# Patient Record
Sex: Female | Born: 1935 | Race: White | Hispanic: No | Smoking: Never smoker
Health system: Southern US, Community
[De-identification: ages and names within clinical notes are randomized; demographics above are authoritative.]

## PROBLEM LIST (undated history)

## (undated) DIAGNOSIS — Z9289 Personal history of other medical treatment: Secondary | ICD-10-CM

## (undated) DIAGNOSIS — I4729 Other ventricular tachycardia: Secondary | ICD-10-CM

## (undated) DIAGNOSIS — R002 Palpitations: Secondary | ICD-10-CM

## (undated) DIAGNOSIS — M1990 Unspecified osteoarthritis, unspecified site: Secondary | ICD-10-CM

## (undated) DIAGNOSIS — B059 Measles without complication: Secondary | ICD-10-CM

## (undated) DIAGNOSIS — I219 Acute myocardial infarction, unspecified: Secondary | ICD-10-CM

## (undated) DIAGNOSIS — Z8619 Personal history of other infectious and parasitic diseases: Secondary | ICD-10-CM

## (undated) DIAGNOSIS — I340 Nonrheumatic mitral (valve) insufficiency: Secondary | ICD-10-CM

## (undated) DIAGNOSIS — R0989 Other specified symptoms and signs involving the circulatory and respiratory systems: Secondary | ICD-10-CM

## (undated) DIAGNOSIS — I2510 Atherosclerotic heart disease of native coronary artery without angina pectoris: Secondary | ICD-10-CM

## (undated) DIAGNOSIS — I10 Essential (primary) hypertension: Secondary | ICD-10-CM

## (undated) DIAGNOSIS — I5022 Chronic systolic (congestive) heart failure: Secondary | ICD-10-CM

## (undated) DIAGNOSIS — I071 Rheumatic tricuspid insufficiency: Secondary | ICD-10-CM

## (undated) DIAGNOSIS — I493 Ventricular premature depolarization: Secondary | ICD-10-CM

## (undated) DIAGNOSIS — F419 Anxiety disorder, unspecified: Secondary | ICD-10-CM

## (undated) DIAGNOSIS — E871 Hypo-osmolality and hyponatremia: Secondary | ICD-10-CM

## (undated) DIAGNOSIS — D696 Thrombocytopenia, unspecified: Secondary | ICD-10-CM

## (undated) DIAGNOSIS — E785 Hyperlipidemia, unspecified: Secondary | ICD-10-CM

## (undated) DIAGNOSIS — B269 Mumps without complication: Secondary | ICD-10-CM

## (undated) DIAGNOSIS — I499 Cardiac arrhythmia, unspecified: Secondary | ICD-10-CM

## (undated) HISTORY — DX: Nonrheumatic mitral (valve) insufficiency: I34.0

## (undated) HISTORY — PX: CORONARY ARTERY BYPASS GRAFT: SHX141

## (undated) HISTORY — DX: Other ventricular tachycardia: I47.29

## (undated) HISTORY — DX: Personal history of other medical treatment: Z92.89

## (undated) HISTORY — PX: TONSILLECTOMY: SUR1361

## (undated) HISTORY — DX: Anxiety disorder, unspecified: F41.9

## (undated) HISTORY — DX: Hypo-osmolality and hyponatremia: E87.1

## (undated) HISTORY — DX: Other specified symptoms and signs involving the circulatory and respiratory systems: R09.89

## (undated) HISTORY — DX: Hypomagnesemia: E83.42

## (undated) HISTORY — DX: Hyperlipidemia, unspecified: E78.5

## (undated) HISTORY — DX: Ventricular premature depolarization: I49.3

## (undated) HISTORY — DX: Chronic systolic (congestive) heart failure: I50.22

## (undated) HISTORY — DX: Rheumatic tricuspid insufficiency: I07.1

## (undated) HISTORY — DX: Thrombocytopenia, unspecified: D69.6

## (undated) HISTORY — DX: Palpitations: R00.2

## (undated) HISTORY — DX: Atherosclerotic heart disease of native coronary artery without angina pectoris: I25.10

---

## 1997-06-06 HISTORY — PX: CARDIAC CATHETERIZATION: SHX172

## 1997-11-12 ENCOUNTER — Emergency Department (HOSPITAL_COMMUNITY): Admission: EM | Admit: 1997-11-12 | Discharge: 1997-11-12 | Payer: Self-pay | Admitting: Emergency Medicine

## 1999-12-09 ENCOUNTER — Other Ambulatory Visit: Admission: RE | Admit: 1999-12-09 | Discharge: 1999-12-09 | Payer: Self-pay | Admitting: Endocrinology

## 2002-01-18 ENCOUNTER — Encounter: Admission: RE | Admit: 2002-01-18 | Discharge: 2002-01-18 | Payer: Self-pay | Admitting: Endocrinology

## 2002-01-18 ENCOUNTER — Encounter: Payer: Self-pay | Admitting: Endocrinology

## 2006-11-30 ENCOUNTER — Encounter: Admission: RE | Admit: 2006-11-30 | Discharge: 2006-11-30 | Payer: Self-pay | Admitting: Endocrinology

## 2007-05-30 ENCOUNTER — Ambulatory Visit: Payer: Self-pay | Admitting: Surgery

## 2010-04-21 ENCOUNTER — Ambulatory Visit: Payer: Self-pay | Admitting: Cardiovascular Disease

## 2010-07-04 ENCOUNTER — Ambulatory Visit: Payer: Self-pay | Admitting: Cardiovascular Disease

## 2010-10-09 ENCOUNTER — Encounter: Payer: Self-pay | Admitting: Endocrinology

## 2010-12-23 NOTE — Procedures (Signed)
CAROTID DUPLEX EXAM   INDICATION:  Left carotid bruit.   HISTORY:  Diabetes:  Yes  Cardiac:  CABG in 1998  Hypertension:  Yes  Smoking:  No  Previous Surgery:  No  CV History:  Amaurosis Fugax:  No, Paresthesias:  No,  Hemiparesis:  No                                       RIGHT             LEFT  Brachial systolic pressure:         120               120  Brachial Doppler waveforms:         Biphasic          Biphasic  Vertebral direction of flow:        Antegrade         Antegrade  DUPLEX VELOCITIES (cm/sec)  CCA peak systolic                   96                101  ECA peak systolic                   94                93  [name] peak systolic                   64                80  [name] end diastolic                   20                29  PLAQUE MORPHOLOGY:                  Calcified         Mixed  PLAQUE AMOUNT:                      Mild              Mild  PLAQUE LOCATION:                    Distal CCA, proximal [name]            [name]   IMPRESSION:  20% to 39% bilateral internal carotid artery stenosis.   Study unchanged from 05/26/2004.   A preliminary copy was faxed to Dr. Harvie Bridge office.   ___________________________________________  Janetta Hora. Darrick Penna, MD   DP/MEDQ  D:  05/30/2007  T:  05/31/2007  Job:  [id number]

## 2010-12-29 ENCOUNTER — Encounter: Payer: Self-pay | Admitting: Cardiovascular Disease

## 2010-12-29 DIAGNOSIS — F419 Anxiety disorder, unspecified: Secondary | ICD-10-CM | POA: Insufficient documentation

## 2010-12-29 DIAGNOSIS — I2510 Atherosclerotic heart disease of native coronary artery without angina pectoris: Secondary | ICD-10-CM | POA: Insufficient documentation

## 2010-12-29 DIAGNOSIS — R0989 Other specified symptoms and signs involving the circulatory and respiratory systems: Secondary | ICD-10-CM | POA: Insufficient documentation

## 2010-12-29 DIAGNOSIS — R002 Palpitations: Secondary | ICD-10-CM | POA: Insufficient documentation

## 2010-12-29 DIAGNOSIS — E785 Hyperlipidemia, unspecified: Secondary | ICD-10-CM | POA: Insufficient documentation

## 2010-12-29 DIAGNOSIS — E119 Type 2 diabetes mellitus without complications: Secondary | ICD-10-CM | POA: Insufficient documentation

## 2010-12-29 DIAGNOSIS — I493 Ventricular premature depolarization: Secondary | ICD-10-CM | POA: Insufficient documentation

## 2010-12-31 ENCOUNTER — Ambulatory Visit: Payer: Self-pay | Admitting: Cardiovascular Disease

## 2010-12-31 ENCOUNTER — Encounter: Payer: Self-pay | Admitting: Cardiovascular Disease

## 2010-12-31 ENCOUNTER — Ambulatory Visit (INDEPENDENT_AMBULATORY_CARE_PROVIDER_SITE_OTHER): Payer: Medicare Other | Admitting: Cardiovascular Disease

## 2010-12-31 VITALS — BP 138/72 | HR 64 | Ht 62.0 in | Wt 122.2 lb

## 2010-12-31 DIAGNOSIS — I2510 Atherosclerotic heart disease of native coronary artery without angina pectoris: Secondary | ICD-10-CM

## 2010-12-31 MED ORDER — NITROGLYCERIN 0.4 MG SL SUBL: 0.4000 mg | SUBLINGUAL_TABLET | SUBLINGUAL | Status: DC | PRN

## 2010-12-31 NOTE — Progress Notes (Signed)
Cynthia [name] Date of Birth  09/08/1931 [name] [address]     [address] [name], [place]  [address] [phone]  Fax  [phone]  History of Present Illness:  No cardiac complaints. Exercising regularly.   HbA1C has been elevated.   Current Outpatient Prescriptions on File Prior to Visit  Medication Sig Dispense Refill  . calcium-vitamin D (OSCAL WITH D) 500-200 MG-UNIT per tablet Take 1 tablet by mouth 2 (two) times daily.        . Choline Fenofibrate (TRILIPIX) 135 MG capsule Take 135 mg by mouth daily.        . Coenzyme Q10 (COQ10) 200 MG CAPS Take by mouth daily.        [name] ezetimibe (ZETIA) 10 MG tablet Take 10 mg by mouth daily.        . meloxicam (MOBIC) 15 MG tablet Take 7.5 mg by mouth every other day.       . Multiple Vitamin (MULTIVITAMIN) tablet Take 1 tablet by mouth daily.        . rosuvastatin (CRESTOR) 20 MG tablet Take 20 mg by mouth daily.        . valsartan-hydrochlorothiazide (DIOVAN-HCT) 320-25 MG per tablet Take 1 tablet by mouth as needed.        . vitamin C (ASCORBIC ACID) 500 MG tablet Take 500 mg by mouth daily.        [name] DISCONTD: aspirin 325 MG tablet Take 325 mg by mouth daily.        [name] DISCONTD: sitaGLIPtan-metformin (JANUMET) 50-500 MG per tablet Take 1 tablet by mouth 2 (two) times daily with a meal.         No Known Allergies  Past Medical History  Diagnosis Date  . Coronary artery disease     STATUS POST PCI AND LATER CABG  . Dyslipidemia   . Diabetes mellitus   . Carotid bruit     LEFT  . Anxiety   . Palpitations   . PVC's (premature ventricular contractions)     Past Surgical History  Procedure Date  . Cardiac catheterization 06/06/97    IT REVEALA MILD TO MODERATE LEFT VENTRICULAR SYSTOLIC DYSFUNCTION  WITH EF OF 40%. THERE IS PERSISTENT AKINESIS OF THE INFERIOR WALL. THERE IS MILD REGURGITATION PRESENT. THERE IS MILD HYPOKINESIS OF THE ANTERIOR  AND APEX WALL  . Coronary artery  bypass graft   . Tonsillectomy     History  Smoking status  . Never Smoker   Smokeless tobacco  . Not on file    History  Alcohol Use No    Family History  Problem Relation Age of Onset  . Heart attack Mother   . Stroke Father   . Cancer Father     Reviw of Systems:  Reviewed in the HPI.  All other systems are negative.  Physical Exam: BP 138/72  Pulse 64  Ht 5\' 2"  (1.575 m)  Wt 122 lb 3.2 oz (55.43 kg)  BMI 22.35 kg/m2 The patient is alert and oriented x 3.  The mood and affect are normal.  The skin is warm and dry.  Color is normal.  The HEENT exam reveals that the sclera are nonicteric.  The mucous membranes are moist.  The carotids are 2+ without bruits.  There is no thyromegaly.  There is no JVD.  The lungs are clear.  The chest wall is non tender.  The heart exam reveals a regular rate with a  normal S1 and S2.  There are no murmurs, gallops, or rubs.  The PMI is not displaced.   Abdominal exam reveals good bowel sounds.  There is no guarding or rebound.  There is no hepatosplenomegaly or tenderness.  There are no masses.  Exam of the legs reveal no clubbing, cyanosis, or edema.  The legs are without rashes.  The distal pulses are intact.  Cranial nerves II - XII are intact.  Motor and sensory functions are intact.  The gait is normal.  Assessment / Plan:

## 2010-12-31 NOTE — Assessment & Plan Note (Signed)
Cynthia [name] seems to be doing well from a cardiac standpoint. I've encouraged her to get back out and exercise on a more regular basis. I am pleased that she's not having any episodes of angina.

## 2011-07-09 ENCOUNTER — Ambulatory Visit: Payer: Medicare Other | Admitting: Cardiovascular Disease

## 2011-07-09 ENCOUNTER — Ambulatory Visit (INDEPENDENT_AMBULATORY_CARE_PROVIDER_SITE_OTHER): Payer: Medicare Other | Admitting: Cardiovascular Disease

## 2011-07-09 ENCOUNTER — Encounter: Payer: Self-pay | Admitting: Cardiovascular Disease

## 2011-07-09 VITALS — BP 132/63 | HR 72 | Ht 62.0 in | Wt 119.4 lb

## 2011-07-09 DIAGNOSIS — I2510 Atherosclerotic heart disease of native coronary artery without angina pectoris: Secondary | ICD-10-CM

## 2011-07-09 DIAGNOSIS — I10 Essential (primary) hypertension: Secondary | ICD-10-CM

## 2011-07-09 MED ORDER — EZETIMIBE 10 MG PO TABS: 10.0000 mg | ORAL_TABLET | Freq: Every day | ORAL | Status: DC

## 2011-07-09 MED ORDER — CHOLINE FENOFIBRATE 135 MG PO CPDR: 135.0000 mg | DELAYED_RELEASE_CAPSULE | Freq: Every day | ORAL | Status: DC

## 2011-07-09 MED ORDER — ROSUVASTATIN CALCIUM 20 MG PO TABS: 20.0000 mg | ORAL_TABLET | Freq: Every day | ORAL | Status: DC

## 2011-07-09 NOTE — Assessment & Plan Note (Signed)
She's not taking her Diovan/ HCTZ as prescribed. She is written for 320/ 25 mg tablets.  She takes one quarter of a tablet every several days. She's working with Dr. supplements. I think that she needs a much smaller tablet-perhaps 40 or 80 mg that she takes on a daily basis. We'll have her continue to follow up with Dr. Evlyn Kanner for this issue.

## 2011-07-09 NOTE — Patient Instructions (Signed)
Your physician wants you to follow-up in: 6 months, You will receive a reminder letter in the mail two months in advance. If you don't receive a letter, please call our office to schedule the follow-up appointment.  Your physician recommends that you continue on your current medications as directed. Please refer to the Current Medication list given to you today.   

## 2011-07-09 NOTE — Assessment & Plan Note (Addendum)
She's doing fine from a cardiac standpoint. She's not having episodes of angina.  We'll continue with her same medications.

## 2011-07-09 NOTE — Progress Notes (Signed)
Cynthia [name] Date of Birth  06-Apr-1932 [facility] [address]    [address] [place], [place]  [address] [phone]  Fax  [phone]  History of Present Illness:  Cynthia [name] is a 77 y.o. female with a history of coronary artery disease. She status post PCI and then later had coronary artery bypass grafting. She also has a history of dyslipidemia, diabetes mellitus, and a left carotid bruit.  She denies any chest pain.  She does have some DOE - only with exertion. She's not been walking quite as much as she would like because of a leg injury. She thinks that she might be out of shape.  Current Outpatient Prescriptions on File Prior to Visit  Medication Sig Dispense Refill  . aspirin 81 MG tablet Take 81 mg by mouth daily.        . Choline Fenofibrate (TRILIPIX) 135 MG capsule Take 135 mg by mouth daily.        . Coenzyme Q10 (COQ10) 200 MG CAPS Take by mouth daily.        [name] ezetimibe (ZETIA) 10 MG tablet Take 10 mg by mouth daily.        . meloxicam (MOBIC) 15 MG tablet Take 7.5 mg by mouth daily.       . Multiple Vitamin (MULTIVITAMIN) tablet Take 1 tablet by mouth daily.        . nitroGLYCERIN (NITROSTAT) 0.4 MG SL tablet Place 1 tablet (0.4 mg total) under the tongue every 5 (five) minutes as needed for chest pain.  25 tablet  12  . rosuvastatin (CRESTOR) 20 MG tablet Take 20 mg by mouth daily.        . SitaGLIPtin-MetFORMIN HCl [phone] MG TB24 Take by mouth daily. 2 DAILY       . vitamin C (ASCORBIC ACID) 500 MG tablet Take 500 mg by mouth daily.        . valsartan-hydrochlorothiazide (DIOVAN-HCT) 320-25 MG per tablet Take 1 tablet by mouth as needed.       She is not taking the Diovan - Hctz on a regular basis.  No Known Allergies  Past Medical History  Diagnosis Date  . Coronary artery disease     STATUS POST PCI AND LATER CABG  . Dyslipidemia   . Diabetes mellitus   . Carotid bruit     LEFT  . Anxiety   . Palpitations   . PVC's (premature ventricular  contractions)     Past Surgical History  Procedure Date  . Cardiac catheterization 06/06/97    IT REVEALA MILD TO MODERATE LEFT VENTRICULAR SYSTOLIC DYSFUNCTION  WITH EF OF 40%. THERE IS PERSISTENT AKINESIS OF THE INFERIOR WALL. THERE IS MILD REGURGITATION PRESENT. THERE IS MILD HYPOKINESIS OF THE ANTERIOR  AND APEX WALL  . Coronary artery bypass graft   . Tonsillectomy     History  Smoking status  . Never Smoker   Smokeless tobacco  . Not on file    History  Alcohol Use No    Family History  Problem Relation Age of Onset  . Heart attack Mother   . Stroke Father   . Cancer Father     Reviw of Systems:  Reviewed in the HPI.  All other systems are negative.  Physical Exam: BP 132/63  Pulse 72  Ht 5\' 2"  (1.575 m)  Wt 119 lb 6.4 oz (54.159 kg)  BMI 21.84 kg/m2 The patient is alert and oriented x 3.  The mood and affect  are normal.   Skin: warm and dry.  Color is normal.    HEENT:   She has good carotids. There is no JVD. Her neck is supple. Her mucous membranes are moist.  Lungs: Lungs are clear to auscultation   Heart: Heart regular rate S1-S2.    Abdomen: Good bowel sounds. There is no hepatosplenomegaly.  Extremities:  She has no clubbing cyanosis or edema.  Neuro:  There's and was nonfocal. Her gait is normal    ECG: Normal sinus rhythm. She has previous inferior wall myocardial infarction and a previous anterior lateral Mi. She has T-wave inversions laterally.  There are no significant changes from her previous tracing.  Assessment / Plan:

## 2011-09-23 DIAGNOSIS — I10 Essential (primary) hypertension: Secondary | ICD-10-CM | POA: Diagnosis not present

## 2011-09-23 DIAGNOSIS — J069 Acute upper respiratory infection, unspecified: Secondary | ICD-10-CM | POA: Diagnosis not present

## 2011-09-23 DIAGNOSIS — I2510 Atherosclerotic heart disease of native coronary artery without angina pectoris: Secondary | ICD-10-CM | POA: Diagnosis not present

## 2011-10-27 DIAGNOSIS — H2510 Age-related nuclear cataract, unspecified eye: Secondary | ICD-10-CM | POA: Diagnosis not present

## 2011-10-27 DIAGNOSIS — E119 Type 2 diabetes mellitus without complications: Secondary | ICD-10-CM | POA: Diagnosis not present

## 2011-11-17 ENCOUNTER — Encounter: Payer: Self-pay | Admitting: Cardiovascular Disease

## 2011-11-17 DIAGNOSIS — E785 Hyperlipidemia, unspecified: Secondary | ICD-10-CM | POA: Diagnosis not present

## 2011-11-17 DIAGNOSIS — D72819 Decreased white blood cell count, unspecified: Secondary | ICD-10-CM | POA: Diagnosis not present

## 2011-11-17 DIAGNOSIS — E1149 Type 2 diabetes mellitus with other diabetic neurological complication: Secondary | ICD-10-CM | POA: Diagnosis not present

## 2011-11-17 DIAGNOSIS — E042 Nontoxic multinodular goiter: Secondary | ICD-10-CM | POA: Diagnosis not present

## 2011-11-17 DIAGNOSIS — I10 Essential (primary) hypertension: Secondary | ICD-10-CM | POA: Diagnosis not present

## 2011-11-17 DIAGNOSIS — E559 Vitamin D deficiency, unspecified: Secondary | ICD-10-CM | POA: Diagnosis not present

## 2012-01-15 ENCOUNTER — Encounter: Payer: Self-pay | Admitting: Cardiovascular Disease

## 2012-01-15 ENCOUNTER — Ambulatory Visit (INDEPENDENT_AMBULATORY_CARE_PROVIDER_SITE_OTHER): Payer: Medicare Other | Admitting: Cardiovascular Disease

## 2012-01-15 VITALS — BP 140/70 | HR 82 | Resp 18 | Ht 62.0 in | Wt 117.0 lb

## 2012-01-15 DIAGNOSIS — I2510 Atherosclerotic heart disease of native coronary artery without angina pectoris: Secondary | ICD-10-CM

## 2012-01-15 NOTE — Patient Instructions (Signed)
Your physician wants you to follow-up in: 6 months  You will receive a reminder letter in the mail two months in advance. If you don't receive a letter, please call our office to schedule the follow-up appointment.  Your physician recommends that you return for a FASTING lipid profile: 6 months   

## 2012-01-15 NOTE — Progress Notes (Signed)
Cynthia [name] Date of Birth  09/18/1931       [facility] Office [address]  [address] [place], [place]  [address]   [place], [place]  [address] [phone]     [phone]   Fax  [phone]    Fax [phone]  Problem List: 1. Coronary artery disease-status post PCI and CABG 2. Dyslipidemia 3. Diabetes mellitus 4. Left carotid bruit  History of Present Illness: Cynthia [name] is a 78 y.o. female with a history of coronary artery disease. She status post PCI and then later had coronary artery bypass grafting. She also has a history of dyslipidemia, diabetes mellitus, and a left carotid bruit.  She has done well from a cardiac standpoint.  She has not been exercising as much as she has in the past.  Current Outpatient Prescriptions on File Prior to Visit  Medication Sig Dispense Refill  . aspirin 81 MG tablet Take 81 mg by mouth daily.        . calcium-vitamin D (CALCIUM 500+D) 500-400 MG-UNIT per tablet Take 2 tablets by mouth daily.        . Choline Fenofibrate (TRILIPIX) 135 MG capsule Take 1 capsule (135 mg total) by mouth daily.  90 capsule  3  . Coenzyme Q10 (COQ10) 200 MG CAPS Take by mouth daily.        [name] ezetimibe (ZETIA) 10 MG tablet Take 1 tablet (10 mg total) by mouth daily.  90 tablet  3  . meloxicam (MOBIC) 15 MG tablet Take 7.5 mg by mouth daily.       . Multiple Vitamin (MULTIVITAMIN) tablet Take 1 tablet by mouth daily.        . rosuvastatin (CRESTOR) 20 MG tablet Take 1 tablet (20 mg total) by mouth daily.  90 tablet  3  . SitaGLIPtin-MetFORMIN HCl [phone] MG TB24 Take by mouth daily. 2 DAILY       . valsartan-hydrochlorothiazide (DIOVAN-HCT) 320-25 MG per tablet Take 1 tablet by mouth as needed.       . vitamin C (ASCORBIC ACID) 500 MG tablet Take 500 mg by mouth daily.        . nitroGLYCERIN (NITROSTAT) 0.4 MG SL tablet Place 1 tablet (0.4 mg total) under the tongue every 5 (five) minutes as needed for chest  pain.  25 tablet  12    No Known Allergies  Past Medical History  Diagnosis Date  . Coronary artery disease     STATUS POST PCI AND LATER CABG  . Dyslipidemia   . Diabetes mellitus   . Carotid bruit     LEFT  . Anxiety   . Palpitations   . PVC's (premature ventricular contractions)     Past Surgical History  Procedure Date  . Cardiac catheterization 06/06/97    IT REVEALA MILD TO MODERATE LEFT VENTRICULAR SYSTOLIC DYSFUNCTION  WITH EF OF 40%. THERE IS PERSISTENT AKINESIS OF THE INFERIOR WALL. THERE IS MILD REGURGITATION PRESENT. THERE IS MILD HYPOKINESIS OF THE ANTERIOR  AND APEX WALL  . Coronary artery bypass graft   . Tonsillectomy     History  Smoking status  . Never Smoker   Smokeless tobacco  . Not on file    History  Alcohol Use No    Family History  Problem Relation Age of Onset  . Heart attack Mother   . Stroke Father   . Cancer Father     Reviw of Systems:  Reviewed in the HPI.  All other systems are negative.  Physical Exam: Blood pressure 140/70, pulse 82, resp. rate 18, height 5\' 2"  (1.575 m), weight 117 lb (53.071 kg). General: Well developed, well nourished, in no acute distress.  Head: Normocephalic, atraumatic, sclera non-icteric, mucus membranes are moist,   Neck: Supple. Carotids are 2 +.  There is a soft left systolic bruit.   No JVD  Lungs: Clear bilaterally to auscultation.  Heart: regular rate.  normal  S1 S2. No murmurs, gallops or rubs.  Abdomen: Soft, non-tender, non-distended with normal bowel sounds. No hepatomegaly. No rebound/guarding. No masses.  Msk:  Strength and tone are normal  Extremities: No clubbing or cyanosis. No edema.  Distal pedal pulses are 2+ and equal bilaterally.  Neuro: Alert and oriented X 3. Moves all extremities spontaneously.  Psych:  Responds to questions appropriately with a normal affect.  ECG:  Her labs from her medical doctor's office reveals a total cholesterol of 117. The triglyceride  level is 39. Her HDL is 43. LDL 66. Her non-HDL is 74. Her LFTs are normal. Glucose is 147. The sodium is 134. Potassium is 4.1. Her CBC is within normal limits with the exception of a mildly decreased hemoglobin. Her TSH is 1.8. Her vitamin D level is 40.9. Assessment / Plan:

## 2012-01-15 NOTE — Assessment & Plan Note (Signed)
Cynthia [name] is doing very well. She's not had any episodes of chest pain or shortness breath. Her labs look great. Her back in 6 months for an office visit and fasting labs.  I've asked her to exercise a little regular. She has occasional episodes of shortness breath with exertion but I suspect that this is due to deconditioning.

## 2012-03-25 DIAGNOSIS — E559 Vitamin D deficiency, unspecified: Secondary | ICD-10-CM | POA: Diagnosis not present

## 2012-03-25 DIAGNOSIS — R112 Nausea with vomiting, unspecified: Secondary | ICD-10-CM | POA: Diagnosis not present

## 2012-03-25 DIAGNOSIS — E785 Hyperlipidemia, unspecified: Secondary | ICD-10-CM | POA: Diagnosis not present

## 2012-03-25 DIAGNOSIS — E1149 Type 2 diabetes mellitus with other diabetic neurological complication: Secondary | ICD-10-CM | POA: Diagnosis not present

## 2012-04-13 DIAGNOSIS — D485 Neoplasm of uncertain behavior of skin: Secondary | ICD-10-CM | POA: Diagnosis not present

## 2012-04-13 DIAGNOSIS — D235 Other benign neoplasm of skin of trunk: Secondary | ICD-10-CM | POA: Diagnosis not present

## 2012-05-24 DIAGNOSIS — Z23 Encounter for immunization: Secondary | ICD-10-CM | POA: Diagnosis not present

## 2012-06-03 DIAGNOSIS — Z1231 Encounter for screening mammogram for malignant neoplasm of breast: Secondary | ICD-10-CM | POA: Diagnosis not present

## 2012-07-14 ENCOUNTER — Ambulatory Visit (INDEPENDENT_AMBULATORY_CARE_PROVIDER_SITE_OTHER): Payer: Medicare Other | Admitting: *Deleted

## 2012-07-14 DIAGNOSIS — I2510 Atherosclerotic heart disease of native coronary artery without angina pectoris: Secondary | ICD-10-CM

## 2012-07-14 DIAGNOSIS — I4949 Other premature depolarization: Secondary | ICD-10-CM | POA: Diagnosis not present

## 2012-07-14 DIAGNOSIS — I10 Essential (primary) hypertension: Secondary | ICD-10-CM

## 2012-07-14 DIAGNOSIS — I493 Ventricular premature depolarization: Secondary | ICD-10-CM

## 2012-07-14 LAB — LIPID PANEL
Cholesterol: 98 mg/dL (ref 0–200)
LDL Cholesterol: 49 mg/dL (ref 0–99)
Triglycerides: 49 mg/dL (ref 0.0–149.0)

## 2012-07-18 ENCOUNTER — Encounter: Payer: Self-pay | Admitting: Cardiovascular Disease

## 2012-07-18 ENCOUNTER — Ambulatory Visit (INDEPENDENT_AMBULATORY_CARE_PROVIDER_SITE_OTHER): Payer: Medicare Other | Admitting: Cardiovascular Disease

## 2012-07-18 VITALS — BP 128/64 | HR 71 | Ht 62.0 in | Wt 115.1 lb

## 2012-07-18 DIAGNOSIS — I2510 Atherosclerotic heart disease of native coronary artery without angina pectoris: Secondary | ICD-10-CM | POA: Diagnosis not present

## 2012-07-18 DIAGNOSIS — E785 Hyperlipidemia, unspecified: Secondary | ICD-10-CM

## 2012-07-18 DIAGNOSIS — I10 Essential (primary) hypertension: Secondary | ICD-10-CM | POA: Diagnosis not present

## 2012-07-18 MED ORDER — CHOLINE FENOFIBRATE 135 MG PO CPDR: 135.0000 mg | DELAYED_RELEASE_CAPSULE | Freq: Every day | ORAL | Status: DC

## 2012-07-18 MED ORDER — ROSUVASTATIN CALCIUM 20 MG PO TABS: 20.0000 mg | ORAL_TABLET | Freq: Every day | ORAL | Status: DC

## 2012-07-18 MED ORDER — EZETIMIBE 10 MG PO TABS: 10.0000 mg | ORAL_TABLET | Freq: Every day | ORAL | Status: DC

## 2012-07-18 MED ORDER — NITROGLYCERIN 0.4 MG SL SUBL: 0.4000 mg | SUBLINGUAL_TABLET | SUBLINGUAL | Status: DC | PRN

## 2012-07-18 NOTE — Assessment & Plan Note (Signed)
Her BP has been stable.

## 2012-07-18 NOTE — Patient Instructions (Signed)
Your physician wants you to follow-up in: 6 months  You will receive a reminder letter in the mail two months in advance. If you don't receive a letter, please call our office to schedule the follow-up appointment.  Your physician recommends that you continue on your current medications as directed. Please refer to the Current Medication list given to you today.  

## 2012-07-18 NOTE — Assessment & Plan Note (Signed)
Cynthia [name] is doing well. We'll continue with her same medications. She's not able to exercise much because she is busy taking care of her husband.

## 2012-07-18 NOTE — Progress Notes (Signed)
Cynthia [name] Date of Birth  10/20/1931       [facility] Office [address]  [address] [place], [place]  [address]   [place], [place]  [address] [phone]     [phone]   Fax  [phone]    Fax [phone]  Problem List: 1. Coronary artery disease-status post PCI( April , 1998)  and CABG ( Nov. 1998) 2. Dyslipidemia 3. Diabetes mellitus 4. Left carotid bruit  History of Present Illness: Cynthia [name] is a 78 y.o.  y.o. female with a history of coronary artery disease. She status post PCI and then later had coronary artery bypass grafting. She also has a history of dyslipidemia, diabetes mellitus, and a left carotid bruit.  She has done well from a cardiac standpoint.  She has not been exercising as much as she has in the past.  She complains of generalized fatigue.  She stays busy doing chores.  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  Current Outpatient Prescriptions on File Prior to Visit  Medication Sig Dispense Refill  . aspirin 81 MG tablet Take 81 mg by mouth daily.        . calcium-vitamin D (CALCIUM 500+D) 500-400 MG-UNIT per tablet Take 2 tablets by mouth daily.        . Choline Fenofibrate (TRILIPIX) 135 MG capsule Take 1 capsule (135 mg total) by mouth daily.  90 capsule  3  . Coenzyme Q10 (COQ10) 200 MG CAPS Take by mouth daily.        [name] ezetimibe (ZETIA) 10 MG tablet Take 1 tablet (10 mg total) by mouth daily.  90 tablet  3  . meloxicam (MOBIC) 15 MG tablet Take 7.5 mg by mouth daily.       . Multiple Vitamin (MULTIVITAMIN) tablet Take 1 tablet by mouth daily.        . nitroGLYCERIN (NITROSTAT) 0.4 MG SL tablet Place 1 tablet (0.4 mg total) under the tongue every 5 (five) minutes as needed for chest pain.  25 tablet  12  . rosuvastatin (CRESTOR) 20 MG tablet Take 1 tablet (20 mg total) by mouth daily.  90 tablet  3  . SitaGLIPtin-MetFORMIN HCl [phone] MG TB24 Take by mouth daily. 2 DAILY        . valsartan-hydrochlorothiazide (DIOVAN-HCT) 320-25 MG per tablet Take 1 tablet by mouth as needed.       . vitamin C (ASCORBIC ACID) 500 MG tablet Take 500 mg by mouth daily.          No Known Allergies  Past Medical History  Diagnosis Date  . Coronary artery disease     STATUS POST PCI AND LATER CABG  . Dyslipidemia   . Diabetes mellitus   . Carotid bruit     LEFT  . Anxiety   . Palpitations   . PVC's (premature ventricular contractions)     Past Surgical History  Procedure Date  . Cardiac catheterization 06/06/97    IT REVEALA MILD TO MODERATE LEFT VENTRICULAR SYSTOLIC DYSFUNCTION  WITH EF OF 40%. THERE IS PERSISTENT AKINESIS OF THE INFERIOR WALL. THERE IS MILD REGURGITATION PRESENT. THERE IS MILD HYPOKINESIS OF THE ANTERIOR  AND APEX WALL  . Coronary artery bypass graft   . Tonsillectomy     History  Smoking status  . Never Smoker   Smokeless tobacco  . Not on file    History  Alcohol  Use No    Family History  Problem Relation Age of Onset  . Heart attack Mother   . Stroke Father   . Cancer Father     Reviw of Systems:  Reviewed in the HPI.  All other systems are negative.  Physical Exam: Blood pressure 128/64, pulse 71, height 5\' 2"  (1.575 m), weight 115 lb 1.9 oz (52.218 kg). General: Well developed, well nourished, in no acute distress.  Head: Normocephalic, atraumatic, sclera non-icteric, mucus membranes are moist,   Neck: Supple. Carotids are 2 +.  There is a soft left systolic bruit.   No JVD  Lungs: Clear bilaterally to auscultation.  Heart: regular rate.  normal  S1 S2. No murmurs, gallops or rubs.  Abdomen: Soft, non-tender, non-distended with normal bowel sounds. No hepatomegaly. No rebound/guarding. No masses.  Msk:  Strength and tone are normal  Extremities: No clubbing or cyanosis. No edema.  Distal pedal pulses are 2+ and equal bilaterally.  Neuro: Alert and oriented X 3. Moves all extremities spontaneously.  Psych:  Responds  to questions appropriately with a normal affect.  ECG:  07/18/2012: Normal sinus rhythm at 71 beats a minute. She has an old inferior wall myocardial infarct. She has poor R-wave progression consistent with an anterior wall myocardial infarct. EKG is unchanged from previous tracings.  Assessment / Plan:

## 2012-08-26 DIAGNOSIS — M899 Disorder of bone, unspecified: Secondary | ICD-10-CM | POA: Diagnosis not present

## 2012-08-26 DIAGNOSIS — M949 Disorder of cartilage, unspecified: Secondary | ICD-10-CM | POA: Diagnosis not present

## 2012-08-26 DIAGNOSIS — E1149 Type 2 diabetes mellitus with other diabetic neurological complication: Secondary | ICD-10-CM | POA: Diagnosis not present

## 2012-09-06 DIAGNOSIS — M949 Disorder of cartilage, unspecified: Secondary | ICD-10-CM | POA: Diagnosis not present

## 2012-10-27 ENCOUNTER — Encounter: Payer: Self-pay | Admitting: Cardiovascular Disease

## 2012-11-28 DIAGNOSIS — E1142 Type 2 diabetes mellitus with diabetic polyneuropathy: Secondary | ICD-10-CM | POA: Diagnosis not present

## 2012-11-28 DIAGNOSIS — R634 Abnormal weight loss: Secondary | ICD-10-CM | POA: Diagnosis not present

## 2012-11-28 DIAGNOSIS — E1149 Type 2 diabetes mellitus with other diabetic neurological complication: Secondary | ICD-10-CM | POA: Diagnosis not present

## 2012-11-28 DIAGNOSIS — E559 Vitamin D deficiency, unspecified: Secondary | ICD-10-CM | POA: Diagnosis not present

## 2012-11-28 DIAGNOSIS — E785 Hyperlipidemia, unspecified: Secondary | ICD-10-CM | POA: Diagnosis not present

## 2012-11-28 DIAGNOSIS — D72819 Decreased white blood cell count, unspecified: Secondary | ICD-10-CM | POA: Diagnosis not present

## 2013-01-16 ENCOUNTER — Encounter: Payer: Self-pay | Admitting: Cardiovascular Disease

## 2013-01-16 ENCOUNTER — Ambulatory Visit (INDEPENDENT_AMBULATORY_CARE_PROVIDER_SITE_OTHER): Payer: Medicare Other | Admitting: Cardiovascular Disease

## 2013-01-16 VITALS — BP 122/71 | HR 70 | Ht 62.0 in | Wt 111.4 lb

## 2013-01-16 DIAGNOSIS — R0989 Other specified symptoms and signs involving the circulatory and respiratory systems: Secondary | ICD-10-CM | POA: Diagnosis not present

## 2013-01-16 DIAGNOSIS — I2510 Atherosclerotic heart disease of native coronary artery without angina pectoris: Secondary | ICD-10-CM

## 2013-01-16 NOTE — Assessment & Plan Note (Signed)
She has a bruit in her midline of her abdomen. We'll get an abdominal aorta duplex as well as renal artery duplex to see if we can detect any narrowings.

## 2013-01-16 NOTE — Patient Instructions (Addendum)
Your physician has requested that you have an abdominal aorta duplex. During this test, an ultrasound is used to evaluate the aorta. Allow 30 minutes for this exam. Do not eat after midnight the day before and avoid carbonated beverages  Your physician has requested that you have a renal artery duplex. During this test, an ultrasound is used to evaluate blood flow to the kidneys. Allow one hour for this exam. Do not eat after midnight the day before and avoid carbonated beverages. Take your medications as you usually do.   Your physician wants you to follow-up in: 6 months with ekg You will receive a reminder letter in the mail two months in advance. If you don't receive a letter, please call our office to schedule the follow-up appointment.   Your physician recommends that you return for a FASTING lipid profile: 6 months visit

## 2013-01-16 NOTE — Assessment & Plan Note (Signed)
No angina Continue meds

## 2013-01-16 NOTE — Progress Notes (Signed)
Cynthia [name] Date of Birth  Jan 31, 1932       [facility] Office [address]  [address], [address] [place], [place]  [address]   [place], [place]  [address] [phone]     [phone]   Fax  [phone]    Fax [phone]  Problem List: 1. Coronary artery disease-status post PCI( April , 1998)  and CABG ( Nov. 1998) 2. Dyslipidemia 3. Diabetes mellitus 4. Left carotid bruit  History of Present Illness: Cynthia [name] is a 79 y.o.  y.o. female with a history of coronary artery disease. She status post PCI and then later had coronary artery bypass grafting. She also has a history of dyslipidemia, diabetes mellitus, and a left carotid bruit.  She has done well from a cardiac standpoint.  She has not been exercising as much as she has in the past.  She complains of generalized fatigue.  She stays busy doing chores.  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  January 16, 2013:  Cynthia [name] is dong well.  She is getting over several viral infections over the past few months. No CP.    Current Outpatient Prescriptions on File Prior to Visit  Medication Sig Dispense Refill  . aspirin 81 MG tablet Take 81 mg by mouth daily.        . calcium-vitamin D (CALCIUM 500+D) 500-400 MG-UNIT per tablet Take 2 tablets by mouth daily.        . Choline Fenofibrate (TRILIPIX) 135 MG capsule Take 1 capsule (135 mg total) by mouth daily.  90 capsule  3  . Coenzyme Q10 (COQ10) 200 MG CAPS Take by mouth daily.        [name] ezetimibe (ZETIA) 10 MG tablet Take 1 tablet (10 mg total) by mouth daily.  90 tablet  3  . meloxicam (MOBIC) 15 MG tablet Take 7.5 mg by mouth daily.       . Multiple Vitamin (MULTIVITAMIN) tablet Take 1 tablet by mouth daily.        . nitroGLYCERIN (NITROSTAT) 0.4 MG SL tablet Place 1 tablet (0.4 mg total) under the tongue every 5 (five) minutes as needed for chest pain.  25 tablet  12  . rosuvastatin (CRESTOR) 20 MG tablet Take 1 tablet  (20 mg total) by mouth daily.  90 tablet  3  . SitaGLIPtin-MetFORMIN HCl [id number] MG TB24 Take by mouth daily. 2 DAILY       . valsartan-hydrochlorothiazide (DIOVAN-HCT) 320-25 MG per tablet Take 0.5 tablets by mouth daily.       . vitamin C (ASCORBIC ACID) 500 MG tablet Take 500 mg by mouth daily.         No current facility-administered medications on file prior to visit.    No Known Allergies  Past Medical History  Diagnosis Date  . Coronary artery disease     STATUS POST PCI AND LATER CABG  . Dyslipidemia   . Diabetes mellitus   . Carotid bruit     LEFT  . Anxiety   . Palpitations   . PVC's (premature ventricular contractions)     Past Surgical History  Procedure Laterality Date  . Cardiac catheterization  06/06/97    IT REVEALA MILD TO MODERATE LEFT VENTRICULAR SYSTOLIC DYSFUNCTION  WITH EF OF 40%. THERE IS PERSISTENT AKINESIS OF THE INFERIOR WALL. THERE IS MILD REGURGITATION PRESENT. THERE IS MILD HYPOKINESIS OF THE ANTERIOR  AND APEX  WALL  . Coronary artery bypass graft    . Tonsillectomy      History  Smoking status  . Never Smoker   Smokeless tobacco  . Not on file    History  Alcohol Use No    Family History  Problem Relation Age of Onset  . Heart attack Mother   . Stroke Father   . Cancer Father     Reviw of Systems:  Reviewed in the HPI.  All other systems are negative.  Physical Exam: Blood pressure 122/71, pulse 70, height 5\' 2"  (1.575 m), weight 111 lb 6.4 oz (50.531 kg). General: Well developed, well nourished, in no acute distress.  Head: Normocephalic, atraumatic, sclera non-icteric, mucus membranes are moist,   Neck: Supple. Carotids are 2 +.  There is a soft left systolic bruit.   No JVD  Lungs: Clear bilaterally to auscultation.  Heart: regular rate.  normal  S1 S2. No murmurs, gallops or rubs.  Abdomen: Soft, non-tender, non-distended with normal bowel sounds. Soft midline bruit.   Msk:  Strength and tone are  normal  Extremities: No clubbing or cyanosis. No edema.  Distal pedal pulses are 2+ and equal bilaterally.  Neuro: Alert and oriented X 3. Moves all extremities spontaneously.  Psych:  Responds to questions appropriately with a normal affect.  ECG:  Assessment / Plan:

## 2013-01-16 NOTE — Assessment & Plan Note (Signed)
Her BP has been well controlled.

## 2013-01-24 ENCOUNTER — Encounter (INDEPENDENT_AMBULATORY_CARE_PROVIDER_SITE_OTHER): Payer: Medicare Other

## 2013-01-24 DIAGNOSIS — I700 Atherosclerosis of aorta: Secondary | ICD-10-CM

## 2013-01-24 DIAGNOSIS — I2510 Atherosclerotic heart disease of native coronary artery without angina pectoris: Secondary | ICD-10-CM

## 2013-01-24 DIAGNOSIS — R0989 Other specified symptoms and signs involving the circulatory and respiratory systems: Secondary | ICD-10-CM

## 2013-02-02 ENCOUNTER — Telehealth: Payer: Self-pay | Admitting: *Deleted

## 2013-02-02 NOTE — Telephone Encounter (Signed)
Message copied by Antony Odea on Thu Feb 02, 2013  3:52 PM ------      Message from: Mariane Masters D      Created: Tue Jan 24, 2013 10:06 AM      Regarding: ABDOM.       01/24/13 Patient cancel. ------

## 2013-02-02 NOTE — Telephone Encounter (Signed)
Pt had renal art duplex/ abd aorta was measured at that time so abd duplex was cancelled. Pt aware or results.

## 2013-02-03 ENCOUNTER — Encounter: Payer: Self-pay | Admitting: Cardiovascular Disease

## 2013-02-03 ENCOUNTER — Other Ambulatory Visit: Payer: Self-pay | Admitting: *Deleted

## 2013-02-03 NOTE — Progress Notes (Signed)
PT MYCHART NOTE ASKED THAT THESE MEDS BE CORRECTED/ DONE

## 2013-03-15 ENCOUNTER — Other Ambulatory Visit: Payer: Self-pay

## 2013-05-11 DIAGNOSIS — Z23 Encounter for immunization: Secondary | ICD-10-CM | POA: Diagnosis not present

## 2013-05-30 DIAGNOSIS — Z1331 Encounter for screening for depression: Secondary | ICD-10-CM | POA: Diagnosis not present

## 2013-05-30 DIAGNOSIS — E559 Vitamin D deficiency, unspecified: Secondary | ICD-10-CM | POA: Diagnosis not present

## 2013-05-30 DIAGNOSIS — E042 Nontoxic multinodular goiter: Secondary | ICD-10-CM | POA: Diagnosis not present

## 2013-05-30 DIAGNOSIS — E1149 Type 2 diabetes mellitus with other diabetic neurological complication: Secondary | ICD-10-CM | POA: Diagnosis not present

## 2013-05-30 DIAGNOSIS — E785 Hyperlipidemia, unspecified: Secondary | ICD-10-CM | POA: Diagnosis not present

## 2013-05-30 DIAGNOSIS — E1142 Type 2 diabetes mellitus with diabetic polyneuropathy: Secondary | ICD-10-CM | POA: Diagnosis not present

## 2013-05-30 DIAGNOSIS — I2510 Atherosclerotic heart disease of native coronary artery without angina pectoris: Secondary | ICD-10-CM | POA: Diagnosis not present

## 2013-05-30 DIAGNOSIS — I359 Nonrheumatic aortic valve disorder, unspecified: Secondary | ICD-10-CM | POA: Diagnosis not present

## 2013-06-01 DIAGNOSIS — Z1231 Encounter for screening mammogram for malignant neoplasm of breast: Secondary | ICD-10-CM | POA: Diagnosis not present

## 2013-06-15 ENCOUNTER — Other Ambulatory Visit: Payer: Self-pay

## 2013-07-24 ENCOUNTER — Ambulatory Visit (INDEPENDENT_AMBULATORY_CARE_PROVIDER_SITE_OTHER): Payer: Medicare Other | Admitting: *Deleted

## 2013-07-24 DIAGNOSIS — E785 Hyperlipidemia, unspecified: Secondary | ICD-10-CM | POA: Diagnosis not present

## 2013-07-24 LAB — LIPID PANEL
HDL: 42.4 mg/dL (ref >39.00)
Total CHOL/HDL Ratio: 2
VLDL: 6.2 mg/dL (ref 0.0–40.0)

## 2013-07-31 ENCOUNTER — Encounter: Payer: Self-pay | Admitting: Cardiovascular Disease

## 2013-07-31 ENCOUNTER — Ambulatory Visit (INDEPENDENT_AMBULATORY_CARE_PROVIDER_SITE_OTHER): Payer: Medicare Other | Admitting: Cardiovascular Disease

## 2013-07-31 VITALS — BP 126/60 | HR 74 | Ht 62.0 in | Wt 110.8 lb

## 2013-07-31 DIAGNOSIS — I10 Essential (primary) hypertension: Secondary | ICD-10-CM

## 2013-07-31 DIAGNOSIS — E785 Hyperlipidemia, unspecified: Secondary | ICD-10-CM | POA: Diagnosis not present

## 2013-07-31 DIAGNOSIS — I2510 Atherosclerotic heart disease of native coronary artery without angina pectoris: Secondary | ICD-10-CM | POA: Diagnosis not present

## 2013-07-31 NOTE — Assessment & Plan Note (Signed)
Her BP is well controlled 

## 2013-07-31 NOTE — Patient Instructions (Signed)
Your physician wants you to follow-up in: 6 MONTHS.  You will receive a reminder letter in the mail two months in advance. If you don't receive a letter, please call our office to schedule the follow-up appointment.  Your physician recommends that you continue on your current medications as directed. Please refer to the Current Medication list given to you today.  

## 2013-07-31 NOTE — Progress Notes (Signed)
Cynthia [name] Date of Birth  06/26/1932       [facility] Office [address]  [address] [place], [place]  [address]   [place], [place]  [address] [phone]     [phone]   Fax  [phone]    Fax [phone]  Problem List: 1. Coronary artery disease-status post PCI( April , 1998)  and CABG ( Nov. 1998) 2. Dyslipidemia 3. Diabetes mellitus 4. Left carotid bruit  History of Present Illness: Cynthia [name] is a 79 y.o.  y.o. female with a history of coronary artery disease. She status post PCI and then later had coronary artery bypass grafting. She also has a history of dyslipidemia, diabetes mellitus, and a left carotid bruit.  She has done well from a cardiac standpoint.  She has not been exercising as much as she has in the past.  She complains of generalized fatigue.  She stays busy doing chores.  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  January 16, 2013:  Cynthia [name] is dong well.  She is getting over several viral infections over the past few months. No CP.    Dec. 22,2014:  Cynthia [name] has had a rough summer.  Also has had a cold.  Her husband has been at [facility] since early Sept.   No cardiac issues.   Current Outpatient Prescriptions on File Prior to Visit  Medication Sig Dispense Refill  . aspirin 81 MG tablet Take 81 mg by mouth daily.        . calcium-vitamin D (CALCIUM 500+D) 500-400 MG-UNIT per tablet Take 2 tablets by mouth daily.        . Choline Fenofibrate (TRILIPIX) 135 MG capsule Take 1 capsule (135 mg total) by mouth daily.  90 capsule  3  . Coenzyme Q10 (COQ10) 200 MG CAPS Take by mouth daily.        [name] ezetimibe (ZETIA) 10 MG tablet Take 1 tablet (10 mg total) by mouth daily.  90 tablet  3  . glimepiride (AMARYL) 2 MG tablet Take 2 mg by mouth daily.      . meloxicam (MOBIC) 15 MG tablet Take 7.5 mg by mouth daily.       . Multiple Vitamin (MULTIVITAMIN) tablet Take 1 tablet by mouth daily.         . rosuvastatin (CRESTOR) 20 MG tablet Take 1 tablet (20 mg total) by mouth daily.  90 tablet  3  . sitaGLIPtan-metformin (JANUMET) 50-1000 MG per tablet Take 1 tablet by mouth 2 (two) times daily with a meal.      . valsartan (DIOVAN) 80 MG tablet Take 1 tablet (80 mg total) by mouth daily.      . vitamin C (ASCORBIC ACID) 500 MG tablet Take 500 mg by mouth daily.        . nitroGLYCERIN (NITROSTAT) 0.4 MG SL tablet Place 1 tablet (0.4 mg total) under the tongue every 5 (five) minutes as needed for chest pain.  25 tablet  12   No current facility-administered medications on file prior to visit.    No Known Allergies  Past Medical History  Diagnosis Date  . Coronary artery disease     STATUS POST PCI AND LATER CABG  . Dyslipidemia   . Diabetes mellitus   . Carotid bruit     LEFT  . Anxiety   . Palpitations   . PVC's (premature ventricular contractions)  Past Surgical History  Procedure Laterality Date  . Cardiac catheterization  06/06/97    IT REVEALA MILD TO MODERATE LEFT VENTRICULAR SYSTOLIC DYSFUNCTION  WITH EF OF 40%. THERE IS PERSISTENT AKINESIS OF THE INFERIOR WALL. THERE IS MILD REGURGITATION PRESENT. THERE IS MILD HYPOKINESIS OF THE ANTERIOR  AND APEX WALL  . Coronary artery bypass graft    . Tonsillectomy      History  Smoking status  . Never Smoker   Smokeless tobacco  . Not on file    History  Alcohol Use No    Family History  Problem Relation Age of Onset  . Heart attack Mother   . Stroke Father   . Cancer Father     Reviw of Systems:  Reviewed in the HPI.  All other systems are negative.  Physical Exam: Blood pressure 126/60, pulse 74, height 5\' 2"  (1.575 m), weight 110 lb 12.8 oz (50.259 kg). General: Well developed, well nourished, in no acute distress.  Head: Normocephalic, atraumatic, sclera non-icteric, mucus membranes are moist,   Neck: Supple. Carotids are 2 +.  There is a soft left systolic bruit.   No JVD  Lungs: Clear  bilaterally to auscultation.  Heart: regular rate.  normal  S1 S2. No murmurs, gallops or rubs.  Abdomen: Soft, non-tender, non-distended with normal bowel sounds. Soft midline bruit.   Msk:  Strength and tone are normal  Extremities: No clubbing or cyanosis. No edema.  Distal pedal pulses are 2+ and equal bilaterally.  Neuro: Alert and oriented X 3. Moves all extremities spontaneously.  Psych:  Responds to questions appropriately with a normal affect.  ECG: Dec. 22, 2014:  NSR at 60, PACs, old Inf. MI.    Assessment / Plan:

## 2013-07-31 NOTE — Assessment & Plan Note (Signed)
She is very stable.  No angina.  Continue current meds

## 2013-08-01 ENCOUNTER — Other Ambulatory Visit: Payer: Self-pay | Admitting: *Deleted

## 2013-08-01 DIAGNOSIS — I10 Essential (primary) hypertension: Secondary | ICD-10-CM

## 2013-08-01 DIAGNOSIS — I2510 Atherosclerotic heart disease of native coronary artery without angina pectoris: Secondary | ICD-10-CM

## 2013-08-01 DIAGNOSIS — E785 Hyperlipidemia, unspecified: Secondary | ICD-10-CM

## 2013-08-01 MED ORDER — EZETIMIBE 10 MG PO TABS: 10.0000 mg | ORAL_TABLET | Freq: Every day | ORAL | Status: DC

## 2013-08-02 ENCOUNTER — Telehealth: Payer: Self-pay | Admitting: *Deleted

## 2013-08-02 ENCOUNTER — Other Ambulatory Visit: Payer: Self-pay

## 2013-08-02 DIAGNOSIS — I2510 Atherosclerotic heart disease of native coronary artery without angina pectoris: Secondary | ICD-10-CM

## 2013-08-02 DIAGNOSIS — E785 Hyperlipidemia, unspecified: Secondary | ICD-10-CM

## 2013-08-02 NOTE — Telephone Encounter (Signed)
Reference [id number] Crestor not covered by insurance, recommending Simvastatin or Atorvastatin  Requires phone call if wanting PA, will not fax information   Will forward to Dr Elease Hashimoto for recommendations

## 2013-08-04 NOTE — Telephone Encounter (Signed)
Atorvastatin 40 a day. Check lipids, liver, bmp in 3 months.

## 2013-08-07 MED ORDER — ATORVASTATIN CALCIUM 40 MG PO TABS: 40.0000 mg | ORAL_TABLET | Freq: Every day | ORAL | Status: DC

## 2013-08-07 NOTE — Telephone Encounter (Signed)
Dicussed switch of med with pt.  Pt will try atorvastain 40 mg 3 month lab date given. Pt informed if this med does not help cholesterol then we will need to file paperwork for PA for crestor. Pt verbalized understanding.

## 2013-10-02 DIAGNOSIS — E785 Hyperlipidemia, unspecified: Secondary | ICD-10-CM | POA: Diagnosis not present

## 2013-10-02 DIAGNOSIS — E042 Nontoxic multinodular goiter: Secondary | ICD-10-CM | POA: Diagnosis not present

## 2013-10-02 DIAGNOSIS — I10 Essential (primary) hypertension: Secondary | ICD-10-CM | POA: Diagnosis not present

## 2013-10-02 DIAGNOSIS — I2510 Atherosclerotic heart disease of native coronary artery without angina pectoris: Secondary | ICD-10-CM | POA: Diagnosis not present

## 2013-10-02 DIAGNOSIS — E1142 Type 2 diabetes mellitus with diabetic polyneuropathy: Secondary | ICD-10-CM | POA: Diagnosis not present

## 2013-10-02 DIAGNOSIS — R634 Abnormal weight loss: Secondary | ICD-10-CM | POA: Diagnosis not present

## 2013-10-02 DIAGNOSIS — E1149 Type 2 diabetes mellitus with other diabetic neurological complication: Secondary | ICD-10-CM | POA: Diagnosis not present

## 2013-10-02 DIAGNOSIS — E559 Vitamin D deficiency, unspecified: Secondary | ICD-10-CM | POA: Diagnosis not present

## 2013-10-09 ENCOUNTER — Encounter: Payer: Self-pay | Admitting: Cardiovascular Disease

## 2013-10-30 DIAGNOSIS — H2510 Age-related nuclear cataract, unspecified eye: Secondary | ICD-10-CM | POA: Diagnosis not present

## 2013-10-30 DIAGNOSIS — E119 Type 2 diabetes mellitus without complications: Secondary | ICD-10-CM | POA: Diagnosis not present

## 2013-11-15 DIAGNOSIS — E1149 Type 2 diabetes mellitus with other diabetic neurological complication: Secondary | ICD-10-CM | POA: Diagnosis not present

## 2013-11-15 DIAGNOSIS — I2510 Atherosclerotic heart disease of native coronary artery without angina pectoris: Secondary | ICD-10-CM | POA: Diagnosis not present

## 2013-11-15 DIAGNOSIS — E785 Hyperlipidemia, unspecified: Secondary | ICD-10-CM | POA: Diagnosis not present

## 2013-11-15 DIAGNOSIS — E042 Nontoxic multinodular goiter: Secondary | ICD-10-CM | POA: Diagnosis not present

## 2013-11-15 DIAGNOSIS — I10 Essential (primary) hypertension: Secondary | ICD-10-CM | POA: Diagnosis not present

## 2013-11-15 DIAGNOSIS — E1142 Type 2 diabetes mellitus with diabetic polyneuropathy: Secondary | ICD-10-CM | POA: Diagnosis not present

## 2013-11-15 DIAGNOSIS — IMO0002 Reserved for concepts with insufficient information to code with codable children: Secondary | ICD-10-CM | POA: Diagnosis not present

## 2013-12-11 ENCOUNTER — Other Ambulatory Visit (INDEPENDENT_AMBULATORY_CARE_PROVIDER_SITE_OTHER): Payer: Medicare Other

## 2013-12-11 DIAGNOSIS — E785 Hyperlipidemia, unspecified: Secondary | ICD-10-CM | POA: Diagnosis not present

## 2013-12-11 DIAGNOSIS — I2510 Atherosclerotic heart disease of native coronary artery without angina pectoris: Secondary | ICD-10-CM | POA: Diagnosis not present

## 2013-12-11 LAB — LIPID PANEL
Cholesterol: 96 mg/dL (ref 0–200)
HDL: 43.6 mg/dL (ref >39.00)
LDL Cholesterol: 48 mg/dL (ref 0–99)
Total CHOL/HDL Ratio: 2
Triglycerides: 23 mg/dL (ref 0.0–149.0)
VLDL: 4.6 mg/dL (ref 0.0–40.0)

## 2013-12-11 LAB — HEPATIC FUNCTION PANEL
ALT: 23 U/L (ref 0–35)
AST: 32 U/L (ref 0–37)
Albumin: 4.1 g/dL (ref 3.5–5.2)
Alkaline Phosphatase: 47 U/L (ref 39–117)
Bilirubin, Direct: 0.1 mg/dL (ref 0.0–0.3)
TOTAL PROTEIN: 6.4 g/dL (ref 6.0–8.3)
Total Bilirubin: 0.6 mg/dL (ref 0.3–1.2)

## 2013-12-11 LAB — BASIC METABOLIC PANEL
BUN: 14 mg/dL (ref 6–23)
CO2: 29 meq/L (ref 19–32)
Calcium: 9.5 mg/dL (ref 8.4–10.5)
Chloride: 101 mEq/L (ref 96–112)
Creatinine, Ser: 0.6 mg/dL (ref 0.4–1.2)
GFR: 113.05 mL/min (ref >60.00)
GLUCOSE: 95 mg/dL (ref 70–99)
POTASSIUM: 3.9 meq/L (ref 3.5–5.1)
SODIUM: 136 meq/L (ref 135–145)

## 2014-01-08 ENCOUNTER — Ambulatory Visit (INDEPENDENT_AMBULATORY_CARE_PROVIDER_SITE_OTHER): Payer: Medicare Other | Admitting: Cardiovascular Disease

## 2014-01-08 ENCOUNTER — Encounter: Payer: Self-pay | Admitting: Cardiovascular Disease

## 2014-01-08 VITALS — BP 154/72 | HR 63 | Ht 62.0 in | Wt 112.8 lb

## 2014-01-08 DIAGNOSIS — I2510 Atherosclerotic heart disease of native coronary artery without angina pectoris: Secondary | ICD-10-CM

## 2014-01-08 DIAGNOSIS — I10 Essential (primary) hypertension: Secondary | ICD-10-CM

## 2014-01-08 NOTE — Assessment & Plan Note (Signed)
She has not had any problems with chest pain or shortness breath. Continue same medications.

## 2014-01-08 NOTE — Assessment & Plan Note (Signed)
Her blood pressure is a little high this morning although it has been fairly well controlled and in fact is low sometimes. She cut the Diovan to 40 mg on her own. She admits to eating a little bit of extra salt. We will have her watch her salt. She'll  bring a blood pressure log with her at her next office visit.

## 2014-01-08 NOTE — Patient Instructions (Addendum)
REDUCE HIGH SODIUM FOODS LIKE CANNED SOUP, GRAVY, SAUCES, READY PREPARED FOODS LIKE FROZEN FOODS; LEAN CUISINE, LASAGNA. BACON, SAUSAGE, LUNCH MEAT, FAST FOODS, HOT DOGS, CHIPS, PIZZA.   Your physician recommends that you continue on your current medications as directed. Please refer to the Current Medication list given to you today.  Your physician wants you to follow-up in: 6 months with Dr. Acie Fredrickson.  You will receive a reminder letter in the mail two months in advance. If you don't receive a letter, please call our office to schedule the follow-up appointment.

## 2014-01-08 NOTE — Progress Notes (Signed)
Cynthia [name] Date of Birth  May 21, 1932       [facility] [address] [place], [place], [place]  [address]   [place], [place]  [address] [phone]     [phone]   Fax  [phone]    Fax [phone]  Problem List: 1. Coronary artery disease-status post PCI( April , 1998)  and CABG ( Nov. 1998) 2. Dyslipidemia 3. Diabetes mellitus 4. Left carotid bruit  History of Present Illness: Cynthia [name] is a 80 y.o.  y.o. female with a history of coronary artery disease. She status post PCI and then later had coronary artery bypass grafting. She also has a history of dyslipidemia, diabetes mellitus, and a left carotid bruit.  She has done well from a cardiac standpoint.  She has not been exercising as much as she has in the past.  She complains of generalized fatigue.  She stays busy doing chores.  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  January 16, 2013:  Cynthia [name] is dong well.  She is getting over several viral infections over the past few months. No CP.    Dec. 22,2014:  Cynthia [name] has had a rough summer.  Also has had a cold.  Her husband has been at [facility] since early Sept.   No cardiac issues.   January 08, 2014:  Cynthia [name] is doing ok.  She has lost lots of weight over the past year or so and now is gaining some weight back.    Her has an abdominal bruit.  Renal artery duplex study was normal.    She has started insulin therapy.    Current Outpatient Prescriptions on File Prior to Visit  Medication Sig Dispense Refill  . aspirin 81 MG tablet Take 81 mg by mouth daily.        [name] atorvastatin (LIPITOR) 40 MG tablet Take 1 tablet (40 mg total) by mouth daily.  90 tablet  1  . calcium-vitamin D (CALCIUM 500+D) 500-400 MG-UNIT per tablet Take 2 tablets by mouth daily.        . Choline Fenofibrate (TRILIPIX) 135 MG capsule Take 1 capsule (135 mg total) by mouth daily.  90 capsule  3  . Coenzyme Q10 (COQ10)  200 MG CAPS Take by mouth daily.        [name] ezetimibe (ZETIA) 10 MG tablet Take 1 tablet (10 mg total) by mouth daily.  90 tablet  2  . glimepiride (AMARYL) 2 MG tablet Take 2 mg by mouth daily.      . meloxicam (MOBIC) 15 MG tablet Take 7.5 mg by mouth daily.       . Multiple Vitamin (MULTIVITAMIN) tablet Take 1 tablet by mouth daily.        . nitroGLYCERIN (NITROSTAT) 0.4 MG SL tablet Place 1 tablet (0.4 mg total) under the tongue every 5 (five) minutes as needed for chest pain.  25 tablet  12  . ONE TOUCH ULTRA TEST test strip As directed      . ONETOUCH DELICA LANCETS FINE MISC As directed      . sitaGLIPtan-metformin (JANUMET) 50-1000 MG per tablet Take 1 tablet by mouth 2 (two) times daily with a meal.      . valsartan (DIOVAN) 80 MG tablet Take 40 mg by mouth daily.       . vitamin C (ASCORBIC ACID) 500 MG tablet Take 500 mg by  mouth daily.         No current facility-administered medications on file prior to visit.    No Known Allergies  Past Medical History  Diagnosis Date  . Coronary artery disease     STATUS POST PCI AND LATER CABG  . Dyslipidemia   . Diabetes mellitus   . Carotid bruit     LEFT  . Anxiety   . Palpitations   . PVC's (premature ventricular contractions)     Past Surgical History  Procedure Laterality Date  . Cardiac catheterization  06/06/97    IT REVEALA MILD TO MODERATE LEFT VENTRICULAR SYSTOLIC DYSFUNCTION  WITH EF OF 40%. THERE IS PERSISTENT AKINESIS OF THE INFERIOR WALL. THERE IS MILD REGURGITATION PRESENT. THERE IS MILD HYPOKINESIS OF THE ANTERIOR  AND APEX WALL  . Coronary artery bypass graft    . Tonsillectomy      History  Smoking status  . Never Smoker   Smokeless tobacco  . Not on file    History  Alcohol Use No    Family History  Problem Relation Age of Onset  . Heart attack Mother   . Stroke Father   . Cancer Father     Reviw of Systems:  Reviewed in the HPI.  All other systems are negative.  Physical Exam: Blood  pressure 154/72, pulse 63, height 5\' 2"  (1.575 m), weight 112 lb 12.8 oz (51.166 kg). General: Well developed, well nourished, in no acute distress.  Head: Normocephalic, atraumatic, sclera non-icteric, mucus membranes are moist,   Neck: Supple. Carotids are 2 +.  There is a soft left systolic bruit.   No JVD  Lungs: Clear bilaterally to auscultation.  Heart: regular rate.  normal  S1 S2. No murmurs, gallops or rubs.  Abdomen: Soft, non-tender, non-distended with normal bowel sounds. Soft midline bruit.   Msk:  Strength and tone are normal  Extremities: No clubbing or cyanosis. No edema.  Distal pedal pulses are 2+ and equal bilaterally.  Neuro: Alert and oriented X 3. Moves all extremities spontaneously.  Psych:  Responds to questions appropriately with a normal affect.  ECG: Dec. 22, 2014:  NSR at 48, PACs, old Inf. MI.    Assessment / Plan:

## 2014-01-26 DIAGNOSIS — E1142 Type 2 diabetes mellitus with diabetic polyneuropathy: Secondary | ICD-10-CM | POA: Diagnosis not present

## 2014-01-26 DIAGNOSIS — M949 Disorder of cartilage, unspecified: Secondary | ICD-10-CM | POA: Diagnosis not present

## 2014-01-26 DIAGNOSIS — E1149 Type 2 diabetes mellitus with other diabetic neurological complication: Secondary | ICD-10-CM | POA: Diagnosis not present

## 2014-01-26 DIAGNOSIS — E042 Nontoxic multinodular goiter: Secondary | ICD-10-CM | POA: Diagnosis not present

## 2014-01-26 DIAGNOSIS — E785 Hyperlipidemia, unspecified: Secondary | ICD-10-CM | POA: Diagnosis not present

## 2014-01-26 DIAGNOSIS — M899 Disorder of bone, unspecified: Secondary | ICD-10-CM | POA: Diagnosis not present

## 2014-01-26 DIAGNOSIS — R7401 Elevation of levels of liver transaminase levels: Secondary | ICD-10-CM | POA: Diagnosis not present

## 2014-01-26 DIAGNOSIS — I2510 Atherosclerotic heart disease of native coronary artery without angina pectoris: Secondary | ICD-10-CM | POA: Diagnosis not present

## 2014-01-26 DIAGNOSIS — I10 Essential (primary) hypertension: Secondary | ICD-10-CM | POA: Diagnosis not present

## 2014-03-23 ENCOUNTER — Encounter: Payer: Self-pay | Admitting: Cardiovascular Disease

## 2014-03-23 DIAGNOSIS — I2510 Atherosclerotic heart disease of native coronary artery without angina pectoris: Secondary | ICD-10-CM

## 2014-03-23 DIAGNOSIS — E785 Hyperlipidemia, unspecified: Secondary | ICD-10-CM

## 2014-03-23 MED ORDER — ATORVASTATIN CALCIUM 40 MG PO TABS: 40.0000 mg | ORAL_TABLET | Freq: Every day | ORAL | Status: DC

## 2014-05-01 ENCOUNTER — Other Ambulatory Visit: Payer: Self-pay

## 2014-05-01 DIAGNOSIS — E785 Hyperlipidemia, unspecified: Secondary | ICD-10-CM

## 2014-05-01 MED ORDER — CHOLINE FENOFIBRATE 135 MG PO CPDR: 135.0000 mg | DELAYED_RELEASE_CAPSULE | Freq: Every day | ORAL | Status: DC

## 2014-05-01 MED ORDER — EZETIMIBE 10 MG PO TABS: 10.0000 mg | ORAL_TABLET | Freq: Every day | ORAL | Status: DC

## 2014-05-23 DIAGNOSIS — Z23 Encounter for immunization: Secondary | ICD-10-CM | POA: Diagnosis not present

## 2014-06-04 DIAGNOSIS — Z803 Family history of malignant neoplasm of breast: Secondary | ICD-10-CM | POA: Diagnosis not present

## 2014-06-04 DIAGNOSIS — Z1231 Encounter for screening mammogram for malignant neoplasm of breast: Secondary | ICD-10-CM | POA: Diagnosis not present

## 2014-06-05 DIAGNOSIS — I2510 Atherosclerotic heart disease of native coronary artery without angina pectoris: Secondary | ICD-10-CM | POA: Diagnosis not present

## 2014-06-05 DIAGNOSIS — M8580 Other specified disorders of bone density and structure, unspecified site: Secondary | ICD-10-CM | POA: Diagnosis not present

## 2014-06-05 DIAGNOSIS — Z6820 Body mass index (BMI) 20.0-20.9, adult: Secondary | ICD-10-CM | POA: Diagnosis not present

## 2014-06-05 DIAGNOSIS — E042 Nontoxic multinodular goiter: Secondary | ICD-10-CM | POA: Diagnosis not present

## 2014-06-05 DIAGNOSIS — R740 Nonspecific elevation of levels of transaminase and lactic acid dehydrogenase [LDH]: Secondary | ICD-10-CM | POA: Diagnosis not present

## 2014-06-05 DIAGNOSIS — E1142 Type 2 diabetes mellitus with diabetic polyneuropathy: Secondary | ICD-10-CM | POA: Diagnosis not present

## 2014-06-05 DIAGNOSIS — E785 Hyperlipidemia, unspecified: Secondary | ICD-10-CM | POA: Diagnosis not present

## 2014-06-05 DIAGNOSIS — E559 Vitamin D deficiency, unspecified: Secondary | ICD-10-CM | POA: Diagnosis not present

## 2014-06-25 DIAGNOSIS — H04123 Dry eye syndrome of bilateral lacrimal glands: Secondary | ICD-10-CM | POA: Diagnosis not present

## 2014-06-25 DIAGNOSIS — H1132 Conjunctival hemorrhage, left eye: Secondary | ICD-10-CM | POA: Diagnosis not present

## 2014-06-25 DIAGNOSIS — H25013 Cortical age-related cataract, bilateral: Secondary | ICD-10-CM | POA: Diagnosis not present

## 2014-07-10 ENCOUNTER — Ambulatory Visit (INDEPENDENT_AMBULATORY_CARE_PROVIDER_SITE_OTHER): Payer: Medicare Other | Admitting: Cardiovascular Disease

## 2014-07-10 ENCOUNTER — Encounter: Payer: Self-pay | Admitting: Cardiovascular Disease

## 2014-07-10 VITALS — BP 154/64 | HR 67 | Ht 62.0 in | Wt 116.0 lb

## 2014-07-10 DIAGNOSIS — I10 Essential (primary) hypertension: Secondary | ICD-10-CM | POA: Diagnosis not present

## 2014-07-10 DIAGNOSIS — I2510 Atherosclerotic heart disease of native coronary artery without angina pectoris: Secondary | ICD-10-CM | POA: Diagnosis not present

## 2014-07-10 NOTE — Progress Notes (Signed)
Cynthia [name] Date of Birth  10-Aug-1932       [facility] [address], [address] [place], [place], [place]  [address]   [place], [place]  [address] [phone]     [phone]   Fax  [phone]    Fax [phone]  Problem List: 1. Coronary artery disease-status post PCI( April , 1998)  and CABG ( Nov. 1998) 2. Dyslipidemia 3. Diabetes mellitus 4. Left carotid bruit  History of Present Illness: Cynthia [name] is a 80 y.o.  80 y.o. female with a history of coronary artery disease. She status post PCI and then later had coronary artery bypass grafting. She also has a history of dyslipidemia, diabetes mellitus, and a left carotid bruit.  She has done well from a cardiac standpoint.  She has not been exercising as much as she has in the past.  She complains of generalized fatigue.  She stays busy doing chores.  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  January 16, 2013:  Cynthia [name] is dong well.  She is getting over several viral infections over the past few months. No CP.    Dec. 22,2014:  Cynthia [name] has had a rough summer.  Also has had a cold.  Her husband has been at [facility] since early Sept.   No cardiac issues.   01-26-14:  Cynthia [name] is doing ok.  She has lost lots of weight over the past year or so and now is gaining some weight back.    Her has an abdominal bruit.  Renal artery duplex study was normal.    She has started insulin therapy.    Dec. 1, 2015:  Cynthia [name] is an 80 yo who I follow for CAD, dyslipidema.  She has had some BP variablility.  No CP,  Has not been exercising.  Has hip / back problems.  Her husband passed away this past 01-27-23.   Current Outpatient Prescriptions on File Prior to Visit  Medication Sig Dispense Refill  . aspirin 81 MG tablet Take 81 mg by mouth daily.      [name] atorvastatin (LIPITOR) 40 MG tablet Take 1 tablet (40 mg total) by mouth daily. 90 tablet 3  . B-D INS SYRINGE  0.5CC/31GX5/16 31G X 5/16" 0.5 ML MISC     . calcium-vitamin D (CALCIUM 500+D) 500-400 MG-UNIT per tablet Take 2 tablets by mouth daily.      . Choline Fenofibrate (TRILIPIX) 135 MG capsule Take 1 capsule (135 mg total) by mouth daily. 90 capsule 3  . Coenzyme Q10 (COQ10) 200 MG CAPS Take by mouth daily.      [name] ezetimibe (ZETIA) 10 MG tablet Take 1 tablet (10 mg total) by mouth daily. 90 tablet 2  . glimepiride (AMARYL) 2 MG tablet Take 2 mg by mouth daily.    [name] LEVEMIR 100 UNIT/ML injection Inject into the skin daily. 5 UNITS IN THE AM. 5 UNITS IN THE PM    . meloxicam (MOBIC) 15 MG tablet Take 7.5 mg by mouth daily.     . Multiple Vitamin (MULTIVITAMIN) tablet Take 1 tablet by mouth daily.      . nitroGLYCERIN (NITROSTAT) 0.4 MG SL tablet Place 1 tablet (0.4 mg total) under the tongue every 5 (five) minutes as needed for chest pain. 25 tablet 12  . ONE TOUCH ULTRA TEST test strip As directed    . Page LANCETS FINE  MISC As directed    . sitaGLIPtan-metformin (JANUMET) 50-1000 MG per tablet Take 1 tablet by mouth 2 (two) times daily with a meal.    . valsartan (DIOVAN) 80 MG tablet Take 40 mg by mouth daily.     . vitamin C (ASCORBIC ACID) 500 MG tablet Take 500 mg by mouth daily.       No current facility-administered medications on file prior to visit.    No Known Allergies  Past Medical History  Diagnosis Date  . Coronary artery disease     STATUS POST PCI AND LATER CABG  . Dyslipidemia   . Diabetes mellitus   . Carotid bruit     LEFT  . Anxiety   . Palpitations   . PVC's (premature ventricular contractions)     Past Surgical History  Procedure Laterality Date  . Cardiac catheterization  06/06/97    IT REVEALA MILD TO MODERATE LEFT VENTRICULAR SYSTOLIC DYSFUNCTION  WITH EF OF 40%. THERE IS PERSISTENT AKINESIS OF THE INFERIOR WALL. THERE IS MILD REGURGITATION PRESENT. THERE IS MILD HYPOKINESIS OF THE ANTERIOR  AND APEX WALL  . Coronary artery bypass graft    .  Tonsillectomy      History  Smoking status  . Never Smoker   Smokeless tobacco  . Not on file    History  Alcohol Use No    Family History  Problem Relation Age of Onset  . Heart attack Mother   . Stroke Father   . Cancer Father     Reviw of Systems:  Reviewed in the HPI.  All other systems are negative.  Physical Exam: Blood pressure 154/64, pulse 67, height 5\' 2"  (1.575 m), weight 116 lb (52.617 kg), SpO2 99 %. General: Well developed, well nourished, in no acute distress.  Head: Normocephalic, atraumatic, sclera non-icteric, mucus membranes are moist,   Neck: Supple. Carotids are 2 +.  There is a soft left systolic bruit.   No JVD  Lungs: Clear bilaterally to auscultation.  Heart: regular rate.  normal  S1 S2. No murmurs, gallops or rubs.  Abdomen: Soft, non-tender, non-distended with normal bowel sounds.    Msk:  Strength and tone are normal  Extremities: No clubbing or cyanosis. No edema.  Distal pedal pulses are 2+ and equal bilaterally.  Neuro: Alert and oriented X 3. Moves all extremities spontaneously.  Psych:  Responds to questions appropriately with a normal affect.  ECG:   Assessment / Plan:

## 2014-07-10 NOTE — Assessment & Plan Note (Signed)
No angina 

## 2014-07-10 NOTE — Patient Instructions (Signed)
Your physician recommends that you continue on your current medications as directed. Please refer to the Current Medication list given to you today.  Your physician wants you to follow-up in: 6 months with Dr. Acie Fredrickson.  You will receive a reminder letter in the mail two months in advance. If you don't receive a letter, please call our office to schedule the follow-up appointment. Your physician recommends that you return for lab work (cholesterol, liver, bmet) in: 6 months on the day of or a few days before your office visit with Dr. Acie Fredrickson.  You will need to FAST for this appointment - nothing to eat or drink after midnight the night before except water.

## 2014-07-10 NOTE — Assessment & Plan Note (Signed)
Her blood pressure is a little elevated today.  She states it has been somewhat variable. Continue same medications. I've advised her to record her blood pressure on a regular basis. If her blood pressure remains elevated, we will increase her Diovan to 160 mg a day.

## 2014-09-18 ENCOUNTER — Telehealth: Payer: Self-pay | Admitting: Cardiovascular Disease

## 2014-09-18 ENCOUNTER — Other Ambulatory Visit: Payer: Self-pay | Admitting: Nurse Practitioner

## 2014-09-18 DIAGNOSIS — I2510 Atherosclerotic heart disease of native coronary artery without angina pectoris: Secondary | ICD-10-CM

## 2014-09-18 DIAGNOSIS — E785 Hyperlipidemia, unspecified: Secondary | ICD-10-CM

## 2014-09-18 MED ORDER — ATORVASTATIN CALCIUM 40 MG PO TABS: 40.0000 mg | ORAL_TABLET | Freq: Every day | ORAL | Status: DC

## 2014-09-18 NOTE — Telephone Encounter (Signed)
Walk in pt form " RX" dropped off gave to [place] [facility]

## 2014-10-01 ENCOUNTER — Other Ambulatory Visit: Payer: Self-pay

## 2014-10-01 DIAGNOSIS — I2510 Atherosclerotic heart disease of native coronary artery without angina pectoris: Secondary | ICD-10-CM

## 2014-10-01 DIAGNOSIS — E785 Hyperlipidemia, unspecified: Secondary | ICD-10-CM

## 2014-10-01 MED ORDER — ATORVASTATIN CALCIUM 40 MG PO TABS: 40.0000 mg | ORAL_TABLET | Freq: Every day | ORAL | Status: DC

## 2014-10-24 DIAGNOSIS — E785 Hyperlipidemia, unspecified: Secondary | ICD-10-CM | POA: Diagnosis not present

## 2014-10-24 DIAGNOSIS — E1142 Type 2 diabetes mellitus with diabetic polyneuropathy: Secondary | ICD-10-CM | POA: Diagnosis not present

## 2014-10-24 DIAGNOSIS — M810 Age-related osteoporosis without current pathological fracture: Secondary | ICD-10-CM | POA: Diagnosis not present

## 2014-10-24 DIAGNOSIS — I6523 Occlusion and stenosis of bilateral carotid arteries: Secondary | ICD-10-CM | POA: Diagnosis not present

## 2014-10-24 DIAGNOSIS — Z6821 Body mass index (BMI) 21.0-21.9, adult: Secondary | ICD-10-CM | POA: Diagnosis not present

## 2014-10-24 DIAGNOSIS — I10 Essential (primary) hypertension: Secondary | ICD-10-CM | POA: Diagnosis not present

## 2014-10-24 DIAGNOSIS — M160 Bilateral primary osteoarthritis of hip: Secondary | ICD-10-CM | POA: Diagnosis not present

## 2014-10-24 DIAGNOSIS — E042 Nontoxic multinodular goiter: Secondary | ICD-10-CM | POA: Diagnosis not present

## 2014-10-24 DIAGNOSIS — L03019 Cellulitis of unspecified finger: Secondary | ICD-10-CM | POA: Diagnosis not present

## 2014-10-24 DIAGNOSIS — I2510 Atherosclerotic heart disease of native coronary artery without angina pectoris: Secondary | ICD-10-CM | POA: Diagnosis not present

## 2014-10-24 DIAGNOSIS — M859 Disorder of bone density and structure, unspecified: Secondary | ICD-10-CM | POA: Diagnosis not present

## 2014-10-24 DIAGNOSIS — D72819 Decreased white blood cell count, unspecified: Secondary | ICD-10-CM | POA: Diagnosis not present

## 2014-11-01 DIAGNOSIS — R0989 Other specified symptoms and signs involving the circulatory and respiratory systems: Secondary | ICD-10-CM | POA: Diagnosis not present

## 2014-11-08 DIAGNOSIS — H2513 Age-related nuclear cataract, bilateral: Secondary | ICD-10-CM | POA: Diagnosis not present

## 2014-11-08 DIAGNOSIS — E119 Type 2 diabetes mellitus without complications: Secondary | ICD-10-CM | POA: Diagnosis not present

## 2014-12-05 DIAGNOSIS — M25552 Pain in left hip: Secondary | ICD-10-CM | POA: Diagnosis not present

## 2014-12-05 DIAGNOSIS — M25551 Pain in right hip: Secondary | ICD-10-CM | POA: Diagnosis not present

## 2015-01-04 ENCOUNTER — Other Ambulatory Visit (INDEPENDENT_AMBULATORY_CARE_PROVIDER_SITE_OTHER): Payer: Medicare Other | Admitting: *Deleted

## 2015-01-04 DIAGNOSIS — I10 Essential (primary) hypertension: Secondary | ICD-10-CM

## 2015-01-04 DIAGNOSIS — I2510 Atherosclerotic heart disease of native coronary artery without angina pectoris: Secondary | ICD-10-CM | POA: Diagnosis not present

## 2015-01-04 LAB — BASIC METABOLIC PANEL
BUN: 12 mg/dL (ref 6–23)
CHLORIDE: 99 meq/L (ref 96–112)
CO2: 29 meq/L (ref 19–32)
CREATININE: 0.69 mg/dL (ref 0.40–1.20)
Calcium: 9.4 mg/dL (ref 8.4–10.5)
GFR: 86.78 mL/min (ref >60.00)
Glucose, Bld: 119 mg/dL — ABNORMAL HIGH (ref 70–99)
POTASSIUM: 3.9 meq/L (ref 3.5–5.1)
Sodium: 131 mEq/L — ABNORMAL LOW (ref 135–145)

## 2015-01-04 LAB — LIPID PANEL
CHOLESTEROL: 99 mg/dL (ref 0–200)
HDL: 36.9 mg/dL — AB (ref >39.00)
LDL CALC: 53 mg/dL (ref 0–99)
NonHDL: 62.1
TRIGLYCERIDES: 45 mg/dL (ref 0.0–149.0)
Total CHOL/HDL Ratio: 3
VLDL: 9 mg/dL (ref 0.0–40.0)

## 2015-01-04 LAB — HEPATIC FUNCTION PANEL
ALBUMIN: 4 g/dL (ref 3.5–5.2)
ALT: 18 U/L (ref 0–35)
AST: 26 U/L (ref 0–37)
Alkaline Phosphatase: 54 U/L (ref 39–117)
BILIRUBIN DIRECT: 0.2 mg/dL (ref 0.0–0.3)
Total Bilirubin: 0.5 mg/dL (ref 0.2–1.2)
Total Protein: 6.3 g/dL (ref 6.0–8.3)

## 2015-01-09 ENCOUNTER — Encounter: Payer: Self-pay | Admitting: *Deleted

## 2015-01-11 ENCOUNTER — Encounter: Payer: Self-pay | Admitting: Cardiovascular Disease

## 2015-01-11 ENCOUNTER — Ambulatory Visit (INDEPENDENT_AMBULATORY_CARE_PROVIDER_SITE_OTHER): Payer: Medicare Other | Admitting: Cardiovascular Disease

## 2015-01-11 VITALS — BP 160/70 | HR 76 | Ht 62.0 in | Wt 117.6 lb

## 2015-01-11 DIAGNOSIS — I2510 Atherosclerotic heart disease of native coronary artery without angina pectoris: Secondary | ICD-10-CM | POA: Diagnosis not present

## 2015-01-11 DIAGNOSIS — E785 Hyperlipidemia, unspecified: Secondary | ICD-10-CM

## 2015-01-11 DIAGNOSIS — I25119 Atherosclerotic heart disease of native coronary artery with unspecified angina pectoris: Secondary | ICD-10-CM | POA: Diagnosis not present

## 2015-01-11 DIAGNOSIS — I10 Essential (primary) hypertension: Secondary | ICD-10-CM

## 2015-01-11 DIAGNOSIS — I209 Angina pectoris, unspecified: Secondary | ICD-10-CM | POA: Diagnosis not present

## 2015-01-11 MED ORDER — EZETIMIBE 10 MG PO TABS: 10.0000 mg | ORAL_TABLET | Freq: Every day | ORAL | Status: DC

## 2015-01-11 MED ORDER — CHOLINE FENOFIBRATE 135 MG PO CPDR: 135.0000 mg | DELAYED_RELEASE_CAPSULE | Freq: Every day | ORAL | Status: DC

## 2015-01-11 NOTE — Patient Instructions (Signed)

## 2015-01-11 NOTE — Progress Notes (Signed)
Cardiology Office Note   Date:  01/11/2015   ID:  Cynthia [name], DOB Apr 02, 1932, MRN [id number]  PCP:  Sheela Stack, MD  Cardiologist:   Thayer Headings, MD   Chief Complaint  Patient presents with  . Coronary Artery Disease   1. Coronary artery disease-status post PCI( April , 1998) and CABG ( Nov. 1998) 2. Dyslipidemia 3. Diabetes mellitus 4. Left carotid bruit 5. Right hip arthritis   History of Present Illness: Cynthia [name] is a 81 y.o. y.o. female with a history of coronary artery disease. She status post PCI and then later had coronary artery bypass grafting. She also has a history of dyslipidemia, diabetes mellitus, and a left carotid bruit.  She has done well from a cardiac standpoint. She has not been exercising as much as she has in the past. She complains of generalized fatigue. She stays busy doing chores. 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  January 16, 2013:  Cynthia [name] is dong well. She is getting over several viral infections over the past few months. No CP.   Dec. 22,2014:  Cynthia [name] has had a rough summer. Also has had a cold. Her husband has been at [facility] since early Sept. No cardiac issues.   January 08, 2014:  Cynthia [name] is doing ok. She has lost lots of weight over the past year or so and now is gaining some weight back. Her has an abdominal bruit. Renal artery duplex study was normal.   She has started insulin therapy.   Dec. 1, 2015:  Cynthia [name] is an 81 yo who I follow for CAD, dyslipidema.  She has had some BP variablility.  No CP,  Has not been exercising. Has hip / back problems.  Her husband passed away this past 09-Mar-2023.    January 11, 2015:  Cynthia [name] is a 81 y.o. female who presents for  Follow up of her CAD and HTN. Having lots of pain this am - as a result , her BP is high  May need to have hip surgery    Past Medical History  Diagnosis Date  . Coronary artery disease     STATUS POST PCI AND  LATER CABG  . Dyslipidemia   . Diabetes mellitus   . Carotid bruit     LEFT  . Anxiety   . Palpitations   . PVC's (premature ventricular contractions)     Past Surgical History  Procedure Laterality Date  . Cardiac catheterization  06/06/97    IT REVEALA MILD TO MODERATE LEFT VENTRICULAR SYSTOLIC DYSFUNCTION  WITH EF OF 40%. THERE IS PERSISTENT AKINESIS OF THE INFERIOR WALL. THERE IS MILD REGURGITATION PRESENT. THERE IS MILD HYPOKINESIS OF THE ANTERIOR  AND APEX WALL  . Coronary artery bypass graft    . Tonsillectomy       Current Outpatient Prescriptions  Medication Sig Dispense Refill  . aspirin 81 MG tablet Take 81 mg by mouth daily.      [name] atorvastatin (LIPITOR) 40 MG tablet Take 1 tablet (40 mg total) by mouth daily. 90 tablet 3  . B-D INS SYRINGE 0.5CC/31GX5/16 31G X 5/16" 0.5 ML MISC     . calcium-vitamin D (CALCIUM 500+D) 500-400 MG-UNIT per tablet Take 2 tablets by mouth daily.      . Choline Fenofibrate (TRILIPIX) 135 MG capsule Take 1 capsule (135 mg total) by mouth daily. 90 capsule 3  . Coenzyme Q10 (COQ10) 200 MG CAPS Take by mouth  daily.      . ezetimibe (ZETIA) 10 MG tablet Take 1 tablet (10 mg total) by mouth daily. 90 tablet 2  . glimepiride (AMARYL) 2 MG tablet Take 2 mg by mouth daily.    [name] LEVEMIR 100 UNIT/ML injection Inject into the skin daily. 5 UNITS IN THE AM. 5 UNITS IN THE PM    . meloxicam (MOBIC) 15 MG tablet Take 7.5 mg by mouth daily.     . Multiple Vitamin (MULTIVITAMIN) tablet Take 1 tablet by mouth daily.      . nitroGLYCERIN (NITROSTAT) 0.4 MG SL tablet Place 1 tablet (0.4 mg total) under the tongue every 5 (five) minutes as needed for chest pain. 25 tablet 12  . ONE TOUCH ULTRA TEST test strip As directed    . ONETOUCH DELICA LANCETS FINE MISC As directed    . sitaGLIPtan-metformin (JANUMET) 50-1000 MG per tablet Take 1 tablet by mouth 2 (two) times daily with a meal.    . valsartan (DIOVAN) 80 MG tablet Take 40 mg by mouth daily.     .  vitamin C (ASCORBIC ACID) 500 MG tablet Take 500 mg by mouth daily.       No current facility-administered medications for this visit.    Allergies:   Review of patient's allergies indicates no known allergies.    Social History:  The patient  reports that she has never smoked. She does not have any smokeless tobacco history on file. She reports that she does not drink alcohol or use illicit drugs.   Family History:  The patient's family history includes Cancer in her father; Heart attack in her mother; Stroke in her father.    ROS:  Please see the history of present illness.    Review of Systems: Constitutional:  denies fever, chills, diaphoresis, appetite change and fatigue.  HEENT: denies photophobia, eye pain, redness, hearing loss, ear pain, congestion, sore throat, rhinorrhea, sneezing, neck pain, neck stiffness and tinnitus.  Respiratory: denies SOB, DOE, cough, chest tightness, and wheezing.  Cardiovascular: denies chest pain, palpitations and leg swelling.  Gastrointestinal: denies nausea, vomiting, abdominal pain, diarrhea, constipation, blood in stool.  Genitourinary: denies dysuria, urgency, frequency, hematuria, flank pain and difficulty urinating.  Musculoskeletal: denies  myalgias, back pain, joint swelling, arthralgias and gait problem.   Skin: denies pallor, rash and wound.  Neurological: denies dizziness, seizures, syncope, weakness, light-headedness, numbness and headaches.   Hematological: denies adenopathy, easy bruising, personal or family bleeding history.  Psychiatric/ Behavioral: denies suicidal ideation, mood changes, confusion, nervousness, sleep disturbance and agitation.       All other systems are reviewed and negative.    PHYSICAL EXAM: VS:  BP 160/70 mmHg  Pulse 76  Ht 5\' 2"  (1.575 m)  Wt 53.343 kg (117 lb 9.6 oz)  BMI 21.50 kg/m2 , BMI Body mass index is 21.5 kg/(m^2). GEN: Well nourished, well developed, in no acute distress HEENT:  normal Neck: no JVD, carotid bruits, or masses Cardiac: RRR; no murmurs, rubs, or gallops,no edema  Respiratory:  clear to auscultation bilaterally, normal work of breathing GI: soft, nontender, nondistended, + BS MS: no deformity or atrophy Skin: warm and dry, no rash Neuro:  Strength and sensation are intact Psych: normal   EKG:  EKG is ordered today. The ekg ordered today demonstrates : NSR  , inc. RBBB , old inferior MI . 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 Review of the above records demonstrates:    ASSESSMENT AND PLAN:  1. Coronary artery disease-status post PCI( April , 1998) and CABG ( Nov. 1998) - no angina  No dyspnea 2. Dyslipidemia - recent labs look great. Continue current meds.  3. Diabetes mellitus 4. Left carotid bruit 5. Right hip arthritis -  She may need surgery on her right  Hip.  She is at low risk for cardiac complications with her hip surgery.    Current medicines are reviewed at length with the patient today.  The patient does not have concerns regarding medicines.  The following changes have been made:  no change  Labs/ tests ordered today include:  No orders of the defined types were placed in this encounter.     Disposition:   FU with me in 6 months      Emalyn Schou, Wonda Cheng, MD  01/11/2015 9:36 AM    [facility] [place], [place], [place]  [address] Phone: [phone]; Fax: [phone]    [facility]  [address] [place], [place]  [address] [phone]    Fax [phone]

## 2015-02-04 ENCOUNTER — Other Ambulatory Visit: Payer: Self-pay

## 2015-02-11 ENCOUNTER — Encounter: Payer: Self-pay | Admitting: Cardiovascular Disease

## 2015-02-14 DIAGNOSIS — M1611 Unilateral primary osteoarthritis, right hip: Secondary | ICD-10-CM | POA: Diagnosis not present

## 2015-02-27 NOTE — H&P (Signed)
TOTAL HIP ADMISSION H&P  Patient is admitted for right total hip arthroplasty, anterior approach.  Subjective:  Chief Complaint:    Right hip primary OA / pain  HPI: Cynthia [name], 81 y.o. female, has a history of pain and functional disability in the right hip(s) due to arthritis and patient has failed non-surgical conservative treatments for greater than 12 weeks to include NSAID's and/or analgesics and activity modification.  Onset of symptoms was gradual starting ~3 years ago with gradually worsening course since that time.The patient noted no past surgery on the right hip(s).  Patient currently rates pain in the right hip at 10 out of 10 with activity. Patient has worsening of pain with activity and weight bearing, trendelenberg gait, pain that interfers with activities of daily living, pain with passive range of motion, crepitus and joint swelling. Patient has evidence of periarticular osteophytes and joint space narrowing by imaging studies. This condition presents safety issues increasing the risk of falls.  There is no current active infection.  Risks, benefits and expectations were discussed with the patient.  Risks including but not limited to the risk of anesthesia, blood clots, nerve damage, blood vessel damage, failure of the prosthesis, infection and up to and including death.  Patient understand the risks, benefits and expectations and wishes to proceed with surgery.   PCP: Sheela Stack, MD  D/C Plans:      Home with HHPT  Post-op Meds:       No Rx given  Tranexamic Acid:      To be given -  Topically  (CAD, previous MI)  Decadron:      Is to be given  FYI:     ASA post-op  Norco post-op    Patient Active Problem List   Diagnosis Date Noted  . Abdominal bruit 01/16/2013  . Hypertension 07/09/2011  . Coronary artery disease   . Dyslipidemia   . Diabetes mellitus   . Carotid bruit   . Anxiety   . Palpitations   . PVC's (premature ventricular contractions)     Past Medical History  Diagnosis Date  . Coronary artery disease     STATUS POST PCI AND LATER CABG  . Dyslipidemia   . Diabetes mellitus   . Carotid bruit     LEFT  . Anxiety   . Palpitations   . PVC's (premature ventricular contractions)     Past Surgical History  Procedure Laterality Date  . Cardiac catheterization  06/06/97    IT REVEALA MILD TO MODERATE LEFT VENTRICULAR SYSTOLIC DYSFUNCTION  WITH EF OF 40%. THERE IS PERSISTENT AKINESIS OF THE INFERIOR WALL. THERE IS MILD REGURGITATION PRESENT. THERE IS MILD HYPOKINESIS OF THE ANTERIOR  AND APEX WALL  . Coronary artery bypass graft    . Tonsillectomy      No prescriptions prior to admission   No Known Allergies   History  Substance Use Topics  . Smoking status: Never Smoker   . Smokeless tobacco: Not on file  . Alcohol Use: No    Family History  Problem Relation Age of Onset  . Heart attack Mother   . Stroke Father   . Cancer Father      Review of Systems  Constitutional: Negative.   HENT: Negative.   Eyes: Negative.   Respiratory: Negative.   Cardiovascular: Negative.   Gastrointestinal: Negative.   Genitourinary: Negative.   Musculoskeletal: Positive for myalgias and joint pain.  Skin: Negative.   Neurological: Negative.   Endo/Heme/Allergies: Negative.  Psychiatric/Behavioral: The patient is nervous/anxious.     Objective:  Physical Exam  Constitutional: She is oriented to person, place, and time. She appears well-developed.  HENT:  Head: Normocephalic.  Eyes: Pupils are equal, round, and reactive to light.  Neck: Neck supple. No JVD present. No tracheal deviation present. No thyromegaly present.  Cardiovascular: Normal rate, regular rhythm, normal heart sounds and intact distal pulses.   Respiratory: Effort normal and breath sounds normal. No respiratory distress. She has no wheezes.  GI: Soft. There is no tenderness. There is no guarding.  Musculoskeletal:       Right hip: She exhibits  decreased range of motion, decreased strength, tenderness and bony tenderness. She exhibits no swelling, no deformity and no laceration.  Lymphadenopathy:    She has no cervical adenopathy.  Neurological: She is alert and oriented to person, place, and time.  Skin: Skin is warm and dry.  Psychiatric: She has a normal mood and affect.     Labs:  Estimated body mass index is 21.50 kg/(m^2) as calculated from the following:   Height as of 01/11/15: 5\' 2"  (1.575 m).   Weight as of 01/11/15: 53.343 kg (117 lb 9.6 oz).   Imaging Review Plain radiographs demonstrate severe degenerative joint disease of the right hip(s). The bone quality appears to be good for age and reported activity level.  Assessment/Plan:  End stage arthritis, right hip(s)  The patient history, physical examination, clinical judgement of the provider and imaging studies are consistent with end stage degenerative joint disease of the right hip(s) and total hip arthroplasty is deemed medically necessary. The treatment options including medical management, injection therapy, arthroscopy and arthroplasty were discussed at length. The risks and benefits of total hip arthroplasty were presented and reviewed. The risks due to aseptic loosening, infection, stiffness, dislocation/subluxation,  thromboembolic complications and other imponderables were discussed.  The patient acknowledged the explanation, agreed to proceed with the plan and consent was signed. Patient is being admitted for inpatient treatment for surgery, pain control, PT, OT, prophylactic antibiotics, VTE prophylaxis, progressive ambulation and ADL's and discharge planning.The patient is planning to be discharged home with home health services.      [place] Pugh Janene Yousuf   PA-C  02/27/2015, 1:32 PM

## 2015-03-06 ENCOUNTER — Encounter (HOSPITAL_COMMUNITY): Payer: Self-pay

## 2015-03-06 ENCOUNTER — Encounter (HOSPITAL_COMMUNITY)
Admission: RE | Admit: 2015-03-06 | Discharge: 2015-03-06 | Disposition: A | Discharge status: Home / Self Care | Payer: Medicare Other | Source: Ambulatory Visit | Attending: Orthopedic Surgery | Admitting: Orthopedic Surgery

## 2015-03-06 DIAGNOSIS — M1611 Unilateral primary osteoarthritis, right hip: Secondary | ICD-10-CM | POA: Insufficient documentation

## 2015-03-06 DIAGNOSIS — I10 Essential (primary) hypertension: Secondary | ICD-10-CM | POA: Diagnosis not present

## 2015-03-06 DIAGNOSIS — E042 Nontoxic multinodular goiter: Secondary | ICD-10-CM | POA: Diagnosis not present

## 2015-03-06 DIAGNOSIS — R740 Nonspecific elevation of levels of transaminase and lactic acid dehydrogenase [LDH]: Secondary | ICD-10-CM | POA: Diagnosis not present

## 2015-03-06 DIAGNOSIS — E785 Hyperlipidemia, unspecified: Secondary | ICD-10-CM | POA: Diagnosis not present

## 2015-03-06 DIAGNOSIS — Z01818 Encounter for other preprocedural examination: Secondary | ICD-10-CM | POA: Diagnosis not present

## 2015-03-06 DIAGNOSIS — E559 Vitamin D deficiency, unspecified: Secondary | ICD-10-CM | POA: Diagnosis not present

## 2015-03-06 DIAGNOSIS — I6523 Occlusion and stenosis of bilateral carotid arteries: Secondary | ICD-10-CM | POA: Diagnosis not present

## 2015-03-06 DIAGNOSIS — I2510 Atherosclerotic heart disease of native coronary artery without angina pectoris: Secondary | ICD-10-CM | POA: Diagnosis not present

## 2015-03-06 DIAGNOSIS — E1142 Type 2 diabetes mellitus with diabetic polyneuropathy: Secondary | ICD-10-CM | POA: Diagnosis not present

## 2015-03-06 DIAGNOSIS — M810 Age-related osteoporosis without current pathological fracture: Secondary | ICD-10-CM | POA: Diagnosis not present

## 2015-03-06 DIAGNOSIS — M160 Bilateral primary osteoarthritis of hip: Secondary | ICD-10-CM | POA: Diagnosis not present

## 2015-03-06 DIAGNOSIS — Z1389 Encounter for screening for other disorder: Secondary | ICD-10-CM | POA: Diagnosis not present

## 2015-03-06 DIAGNOSIS — Z6820 Body mass index (BMI) 20.0-20.9, adult: Secondary | ICD-10-CM | POA: Diagnosis not present

## 2015-03-06 HISTORY — DX: Cardiac arrhythmia, unspecified: I49.9

## 2015-03-06 HISTORY — DX: Acute myocardial infarction, unspecified: I21.9

## 2015-03-06 HISTORY — DX: Measles without complication: B05.9

## 2015-03-06 HISTORY — DX: Unspecified osteoarthritis, unspecified site: M19.90

## 2015-03-06 HISTORY — DX: Essential (primary) hypertension: I10

## 2015-03-06 HISTORY — DX: Personal history of other infectious and parasitic diseases: Z86.19

## 2015-03-06 HISTORY — DX: Mumps without complication: B26.9

## 2015-03-06 LAB — URINALYSIS, ROUTINE W REFLEX MICROSCOPIC
BILIRUBIN URINE: NEGATIVE
Glucose, UA: NEGATIVE mg/dL
HGB URINE DIPSTICK: NEGATIVE
Ketones, ur: NEGATIVE mg/dL
Leukocytes, UA: NEGATIVE
NITRITE: NEGATIVE
Protein, ur: NEGATIVE mg/dL
Specific Gravity, Urine: 1.007 (ref 1.005–1.030)
UROBILINOGEN UA: 0.2 mg/dL (ref 0.0–1.0)
pH: 7 (ref 5.0–8.0)

## 2015-03-06 LAB — PROTIME-INR
INR: 1.14 (ref 0.00–1.49)
PROTHROMBIN TIME: 14.8 s (ref 11.6–15.2)

## 2015-03-06 LAB — BASIC METABOLIC PANEL
Anion gap: 8 (ref 5–15)
BUN: 13 mg/dL (ref 6–20)
CALCIUM: 9.5 mg/dL (ref 8.9–10.3)
CO2: 25 mmol/L (ref 22–32)
Chloride: 98 mmol/L — ABNORMAL LOW (ref 101–111)
Creatinine, Ser: 0.62 mg/dL (ref 0.44–1.00)
GFR calc non Af Amer: >60 mL/min (ref >60)
GLUCOSE: 139 mg/dL — AB (ref 65–99)
Potassium: 4.4 mmol/L (ref 3.5–5.1)
Sodium: 131 mmol/L — ABNORMAL LOW (ref 135–145)

## 2015-03-06 LAB — CBC
HCT: 37.1 % (ref 36.0–46.0)
Hemoglobin: 12.4 g/dL (ref 12.0–15.0)
MCH: 30 pg (ref 26.0–34.0)
MCHC: 33.4 g/dL (ref 30.0–36.0)
MCV: 89.6 fL (ref 78.0–100.0)
Platelets: 169 10*3/uL (ref 150–400)
RBC: 4.14 MIL/uL (ref 3.87–5.11)
RDW: 13.8 % (ref 11.5–15.5)
WBC: 4.7 10*3/uL (ref 4.0–10.5)

## 2015-03-06 LAB — SURGICAL PCR SCREEN
MRSA, PCR: NEGATIVE
Staphylococcus aureus: NEGATIVE

## 2015-03-06 LAB — ABO/RH: ABO/RH(D): A POS

## 2015-03-06 LAB — APTT: aPTT: 34 seconds (ref 24–37)

## 2015-03-06 NOTE — Progress Notes (Signed)
Clearance note per Dr Cathie Olden on chart 01/11/2015 along with progress notes from 01/11/2015, 01/08/2014 and 07/10/2014  Clearance note per Dr Forde Dandy with H&P 02/12/2015  EKG per epic 01/11/2015 Stress test epic 06/21/2009 ECHO epic 06/13/2002  Renal artery duplex epic 01/24/2013

## 2015-03-06 NOTE — Patient Instructions (Signed)
Cynthia [name]  03/06/2015   Your procedure is scheduled on: Tuesday March 12, 2015   Report to [name] take [place]  elevators to 3rd floor to  [place] at 5:00 AM.  Call this number if you have problems the morning of surgery [phone]   Remember: ONLY 1 PERSON MAY GO WITH YOU TO SHORT STAY TO GET  READY MORNING OF [name].  Do not eat food or drink liquids :After Midnight.     Take 1/2 dose of insulin night prior to surgery; you may use your eye drops morning of surgery if needed (bring with you day of surgery)                               You may not have any metal on your body including hair pins and              piercings  Do not wear jewelry, make-up, lotions, powders or perfumes, deodorant             Do not wear nail polish.  Do not shave  48 hours prior to surgery.             Do not bring valuables to the hospital. [place].  Contacts, dentures or bridgework may not be worn into surgery.  Leave suitcase in the car. After surgery it may be brought to your room.                  Please read over the following fact sheets you were given:[name]; INCENTIVE SPIROMETER; BLOOD TRANSFUSION INFORMATION SHEET _____________________________________________________________________             [name] Before surgery, you can play an important role.  Because skin is not sterile, your skin needs to be as free of germs as possible.  You can reduce the number of germs on your skin by washing with CHG (chlorahexidine gluconate) soap before surgery.  CHG is an antiseptic cleaner which kills germs and bonds with the skin to continue killing germs even after washing. Please DO NOT use if you have an allergy to CHG or antibacterial soaps.  If your skin becomes reddened/irritated stop using the CHG and inform your nurse when you arrive at Short  Stay. Do not shave (including legs and underarms) for at least 48 hours prior to the first CHG shower.  You may shave your face/neck. Please follow these instructions carefully:  1.  Shower with CHG Soap the night before surgery and the  morning of Surgery.  2.  If you choose to wash your hair, wash your hair first as usual with your  normal  shampoo.  3.  After you shampoo, rinse your hair and body thoroughly to remove the  shampoo.                           4.  Use CHG as you would any other liquid soap.  You can apply chg directly  to the skin and wash                       Gently  with a scrungie or clean washcloth.  5.  Apply the CHG Soap to your body ONLY FROM THE NECK DOWN.   Do not use on face/ open                           Wound or open sores. Avoid contact with eyes, ears mouth and genitals (private parts).                       Wash face,  Genitals (private parts) with your normal soap.             6.  Wash thoroughly, paying special attention to the area where your surgery  will be performed.  7.  Thoroughly rinse your body with warm water from the neck down.  8.  DO NOT shower/wash with your normal soap after using and rinsing off  the CHG Soap.                9.  Pat yourself dry with a clean towel.            10.  Wear clean pajamas.            11.  Place clean sheets on your bed the night of your first shower and do not  sleep with pets. Day of Surgery : Do not apply any lotions/deodorants the morning of surgery.  Please wear clean clothes to the hospital/surgery center.  FAILURE TO FOLLOW THESE INSTRUCTIONS MAY RESULT IN THE CANCELLATION OF YOUR SURGERY PATIENT SIGNATURE_________________________________  NURSE SIGNATURE__________________________________  ________________________________________________________________________   Cynthia [name]  An incentive spirometer is a tool that can help keep your lungs clear and active. This tool measures how well you are  filling your lungs with each breath. Taking long deep breaths may help reverse or decrease the chance of developing breathing (pulmonary) problems (especially infection) following:  A long period of time when you are unable to move or be active. BEFORE THE PROCEDURE   If the spirometer includes an indicator to show your best effort, your nurse or respiratory therapist will set it to a desired goal.  If possible, sit up straight or lean slightly forward. Try not to slouch.  Hold the incentive spirometer in an upright position. INSTRUCTIONS FOR USE   Sit on the edge of your bed if possible, or sit up as far as you can in bed or on a chair.  Hold the incentive spirometer in an upright position.  Breathe out normally.  Place the mouthpiece in your mouth and seal your lips tightly around it.  Breathe in slowly and as deeply as possible, raising the piston or the ball toward the top of the column.  Hold your breath for 3-5 seconds or for as long as possible. Allow the piston or ball to fall to the bottom of the column.  Remove the mouthpiece from your mouth and breathe out normally.  Rest for a few seconds and repeat Steps 1 through 7 at least 10 times every 1-2 hours when you are awake. Take your time and take a few normal breaths between deep breaths.  The spirometer may include an indicator to show your best effort. Use the indicator as a goal to work toward during each repetition.  After each set of 10 deep breaths, practice coughing to be sure your lungs are clear. If you have an incision (the cut made at the time of surgery), support  your incision when coughing by placing a pillow or rolled up towels firmly against it. Once you are able to get out of bed, walk around indoors and cough well. You may stop using the incentive spirometer when instructed by your caregiver.  RISKS AND COMPLICATIONS  Take your time so you do not get dizzy or light-headed.  If you are in pain, you may  need to take or ask for pain medication before doing incentive spirometry. It is harder to take a deep breath if you are having pain. AFTER USE  Rest and breathe slowly and easily.  It can be helpful to keep track of a log of your progress. Your caregiver can provide you with a simple table to help with this. If you are using the spirometer at home, follow these instructions: [place] IF:   You are having difficultly using the spirometer.  You have trouble using the spirometer as often as instructed.  Your pain medication is not giving enough relief while using the spirometer.  You develop fever of 100.5 F (38.1 C) or higher. SEEK IMMEDIATE MEDICAL CARE IF:   You cough up bloody sputum that had not been present before.  You develop fever of 102 F (38.9 C) or greater.  You develop worsening pain at or near the incision site. MAKE SURE YOU:   Understand these instructions.  Will watch your condition.  Will get help right away if you are not doing well or get worse. Document Released: 12/07/2006 Document Revised: 10/19/2011 Document Reviewed: 02/07/2007 ExitCare Patient Information 2014 ExitCare, [place].   ________________________________________________________________________  WHAT IS A BLOOD TRANSFUSION? Blood Transfusion Information  A transfusion is the replacement of blood or some of its parts. Blood is made up of multiple cells which provide different functions.  Red blood cells carry oxygen and are used for blood loss replacement.  White blood cells fight against infection.  Platelets control bleeding.  Plasma helps clot blood.  Other blood products are available for specialized needs, such as hemophilia or other clotting disorders. BEFORE THE TRANSFUSION  Who gives blood for transfusions?   Healthy volunteers who are fully evaluated to make sure their blood is safe. This is blood bank blood. Transfusion therapy is the safest it has ever been in  the practice of medicine. Before blood is taken from a donor, a complete history is taken to make sure that person has no history of diseases nor engages in risky social behavior (examples are intravenous drug use or sexual activity with multiple partners). The donor's travel history is screened to minimize risk of transmitting infections, such as malaria. The donated blood is tested for signs of infectious diseases, such as HIV and hepatitis. The blood is then tested to be sure it is compatible with you in order to minimize the chance of a transfusion reaction. If you or a relative donates blood, this is often done in anticipation of surgery and is not appropriate for emergency situations. It takes many days to process the donated blood. RISKS AND COMPLICATIONS Although transfusion therapy is very safe and saves many lives, the main dangers of transfusion include:   Getting an infectious disease.  Developing a transfusion reaction. This is an allergic reaction to something in the blood you were given. Every precaution is taken to prevent this. The decision to have a blood transfusion has been considered carefully by your caregiver before blood is given. Blood is not given unless the benefits outweigh the risks. AFTER THE TRANSFUSION  Right after receiving a blood transfusion, you will usually feel much better and more energetic. This is especially true if your red blood cells have gotten low (anemic). The transfusion raises the level of the red blood cells which carry oxygen, and this usually causes an energy increase.  The nurse administering the transfusion will monitor you carefully for complications. HOME CARE INSTRUCTIONS  No special instructions are needed after a transfusion. You may find your energy is better. Speak with your caregiver about any limitations on activity for underlying diseases you may have. SEEK MEDICAL CARE IF:   Your condition is not improving after your  transfusion.  You develop redness or irritation at the intravenous (IV) site. SEEK IMMEDIATE MEDICAL CARE IF:  Any of the following symptoms occur over the next 12 hours:  Shaking chills.  You have a temperature by mouth above 102 F (38.9 C), not controlled by medicine.  Chest, back, or muscle pain.  People around you feel you are not acting correctly or are confused.  Shortness of breath or difficulty breathing.  Dizziness and fainting.  You get a rash or develop hives.  You have a decrease in urine output.  Your urine turns a dark color or changes to pink, red, or brown. Any of the following symptoms occur over the next 10 days:  You have a temperature by mouth above 102 F (38.9 C), not controlled by medicine.  Shortness of breath.  Weakness after normal activity.  The white part of the eye turns yellow (jaundice).  You have a decrease in the amount of urine or are urinating less often.  Your urine turns a dark color or changes to pink, red, or brown. Document Released: 07/24/2000 Document Revised: 10/19/2011 Document Reviewed: 03/12/2008 [facility] Patient Information 2014 [place], [place].  _______________________________________________________________________

## 2015-03-06 NOTE — Progress Notes (Signed)
BMP results in epic per PAT visit 03/06/2015 sent to Dr Alvan Dame

## 2015-03-12 ENCOUNTER — Inpatient Hospital Stay (HOSPITAL_COMMUNITY): Payer: Medicare Other | Admitting: Certified Registered Nurse Anesthetist

## 2015-03-12 ENCOUNTER — Inpatient Hospital Stay (HOSPITAL_COMMUNITY): Payer: Medicare Other

## 2015-03-12 ENCOUNTER — Encounter (HOSPITAL_COMMUNITY): Payer: Self-pay | Admitting: Certified Registered Nurse Anesthetist

## 2015-03-12 ENCOUNTER — Inpatient Hospital Stay (HOSPITAL_COMMUNITY)
Admission: AD | Admit: 2015-03-12 | Discharge: 2015-03-15 | DRG: 470 | Disposition: A | Discharge status: Home Health | Payer: Medicare Other | Source: Ambulatory Visit | Attending: Orthopedic Surgery | Admitting: Orthopedic Surgery

## 2015-03-12 ENCOUNTER — Encounter (HOSPITAL_COMMUNITY)
Admission: AD | Disposition: A | Discharge status: Home Health | Payer: Self-pay | Source: Ambulatory Visit | Attending: Orthopedic Surgery

## 2015-03-12 DIAGNOSIS — Z951 Presence of aortocoronary bypass graft: Secondary | ICD-10-CM | POA: Diagnosis not present

## 2015-03-12 DIAGNOSIS — I2510 Atherosclerotic heart disease of native coronary artery without angina pectoris: Secondary | ICD-10-CM | POA: Diagnosis present

## 2015-03-12 DIAGNOSIS — E119 Type 2 diabetes mellitus without complications: Secondary | ICD-10-CM | POA: Diagnosis present

## 2015-03-12 DIAGNOSIS — Z96649 Presence of unspecified artificial hip joint: Secondary | ICD-10-CM

## 2015-03-12 DIAGNOSIS — Z471 Aftercare following joint replacement surgery: Secondary | ICD-10-CM | POA: Diagnosis not present

## 2015-03-12 DIAGNOSIS — E785 Hyperlipidemia, unspecified: Secondary | ICD-10-CM | POA: Diagnosis present

## 2015-03-12 DIAGNOSIS — I252 Old myocardial infarction: Secondary | ICD-10-CM | POA: Diagnosis not present

## 2015-03-12 DIAGNOSIS — Z01812 Encounter for preprocedural laboratory examination: Secondary | ICD-10-CM | POA: Diagnosis not present

## 2015-03-12 DIAGNOSIS — F419 Anxiety disorder, unspecified: Secondary | ICD-10-CM | POA: Diagnosis present

## 2015-03-12 DIAGNOSIS — I10 Essential (primary) hypertension: Secondary | ICD-10-CM | POA: Diagnosis present

## 2015-03-12 DIAGNOSIS — M1611 Unilateral primary osteoarthritis, right hip: Principal | ICD-10-CM | POA: Diagnosis present

## 2015-03-12 DIAGNOSIS — Z9861 Coronary angioplasty status: Secondary | ICD-10-CM

## 2015-03-12 DIAGNOSIS — Z96641 Presence of right artificial hip joint: Secondary | ICD-10-CM | POA: Diagnosis not present

## 2015-03-12 DIAGNOSIS — M169 Osteoarthritis of hip, unspecified: Secondary | ICD-10-CM | POA: Diagnosis not present

## 2015-03-12 DIAGNOSIS — M25551 Pain in right hip: Secondary | ICD-10-CM | POA: Diagnosis not present

## 2015-03-12 HISTORY — PX: TOTAL HIP ARTHROPLASTY: SHX124

## 2015-03-12 LAB — TYPE AND SCREEN
ABO/RH(D): A POS
Antibody Screen: NEGATIVE

## 2015-03-12 LAB — GLUCOSE, CAPILLARY
GLUCOSE-CAPILLARY: 222 mg/dL — AB (ref 65–99)
GLUCOSE-CAPILLARY: 173 mg/dL — AB (ref 65–99)
GLUCOSE-CAPILLARY: 261 mg/dL — AB (ref 65–99)
Glucose-Capillary: 265 mg/dL — ABNORMAL HIGH (ref 65–99)
Glucose-Capillary: 158 mg/dL — ABNORMAL HIGH (ref 65–99)

## 2015-03-12 SURGERY — ARTHROPLASTY, HIP, TOTAL, ANTERIOR APPROACH
Anesthesia: Spinal | Site: Hip | Laterality: Right

## 2015-03-12 MED ORDER — FENTANYL CITRATE (PF) 100 MCG/2ML IJ SOLN
INTRAMUSCULAR | Status: DC | PRN
  Administered 2015-03-12: 25 ug via INTRAVENOUS
  Administered 2015-03-12: 50 ug via INTRAVENOUS
  Administered 2015-03-12: 25 ug via INTRAVENOUS

## 2015-03-12 MED ORDER — PHENYLEPHRINE HCL 10 MG/ML IJ SOLN
INTRAMUSCULAR | Status: AC
  Filled 2015-03-12: qty 1

## 2015-03-12 MED ORDER — CELECOXIB 200 MG PO CAPS
200.0000 mg | ORAL_CAPSULE | Freq: Two times a day (BID) | ORAL | Status: DC
  Administered 2015-03-12 – 2015-03-15 (×6): 200 mg via ORAL
  Filled 2015-03-12 (×8): qty 1

## 2015-03-12 MED ORDER — CEFAZOLIN SODIUM-DEXTROSE 2-3 GM-% IV SOLR
INTRAVENOUS | Status: AC
  Filled 2015-03-12: qty 50

## 2015-03-12 MED ORDER — INSULIN DETEMIR 100 UNIT/ML ~~LOC~~ SOLN
5.0000 [IU] | Freq: Two times a day (BID) | SUBCUTANEOUS | Status: DC
  Administered 2015-03-12 – 2015-03-15 (×7): 5 [IU] via SUBCUTANEOUS
  Filled 2015-03-12 (×7): qty 0.05

## 2015-03-12 MED ORDER — DEXAMETHASONE SODIUM PHOSPHATE 10 MG/ML IJ SOLN
INTRAMUSCULAR | Status: DC | PRN
  Administered 2015-03-12: 10 mg via INTRAVENOUS

## 2015-03-12 MED ORDER — STERILE WATER FOR IRRIGATION IR SOLN
Status: DC | PRN
  Administered 2015-03-12: 1000 mL

## 2015-03-12 MED ORDER — PROPOFOL 10 MG/ML IV BOLUS
INTRAVENOUS | Status: AC
  Filled 2015-03-12: qty 20

## 2015-03-12 MED ORDER — DIPHENHYDRAMINE HCL 25 MG PO CAPS: 25.0000 mg | ORAL_CAPSULE | Freq: Four times a day (QID) | ORAL | Status: DC | PRN

## 2015-03-12 MED ORDER — LINAGLIPTIN 5 MG PO TABS
5.0000 mg | ORAL_TABLET | Freq: Every day | ORAL | Status: DC
  Administered 2015-03-13 – 2015-03-15 (×3): 5 mg via ORAL
  Filled 2015-03-12 (×4): qty 1

## 2015-03-12 MED ORDER — POLYETHYL GLYCOL-PROPYL GLYCOL 0.4-0.3 % OP SOLN: 1.0000 [drp] | Freq: Every day | OPHTHALMIC | Status: DC | PRN

## 2015-03-12 MED ORDER — METHOCARBAMOL 1000 MG/10ML IJ SOLN
500.0000 mg | Freq: Four times a day (QID) | INTRAVENOUS | Status: DC | PRN
  Administered 2015-03-12: 500 mg via INTRAVENOUS
  Filled 2015-03-12 (×2): qty 5

## 2015-03-12 MED ORDER — CHLORHEXIDINE GLUCONATE 4 % EX LIQD: 60.0000 mL | Freq: Once | CUTANEOUS | Status: DC

## 2015-03-12 MED ORDER — SODIUM CHLORIDE 0.9 % IJ SOLN
INTRAMUSCULAR | Status: AC
  Filled 2015-03-12: qty 10

## 2015-03-12 MED ORDER — MIDAZOLAM HCL 2 MG/2ML IJ SOLN
INTRAMUSCULAR | Status: AC
  Filled 2015-03-12: qty 4

## 2015-03-12 MED ORDER — ASPIRIN EC 325 MG PO TBEC
325.0000 mg | DELAYED_RELEASE_TABLET | Freq: Two times a day (BID) | ORAL | Status: DC
  Administered 2015-03-13 – 2015-03-15 (×5): 325 mg via ORAL
  Filled 2015-03-12 (×7): qty 1

## 2015-03-12 MED ORDER — FENTANYL CITRATE (PF) 100 MCG/2ML IJ SOLN: 25.0000 ug | INTRAMUSCULAR | Status: DC | PRN

## 2015-03-12 MED ORDER — SITAGLIPTIN PHOS-METFORMIN HCL 50-1000 MG PO TABS: 1.0000 | ORAL_TABLET | Freq: Two times a day (BID) | ORAL | Status: DC

## 2015-03-12 MED ORDER — DEXAMETHASONE SODIUM PHOSPHATE 10 MG/ML IJ SOLN
10.0000 mg | Freq: Once | INTRAMUSCULAR | Status: DC
  Filled 2015-03-12: qty 1

## 2015-03-12 MED ORDER — MAGNESIUM CITRATE PO SOLN: 1.0000 | Freq: Once | ORAL | Status: AC | PRN

## 2015-03-12 MED ORDER — EPHEDRINE SULFATE 50 MG/ML IJ SOLN
INTRAMUSCULAR | Status: AC
  Filled 2015-03-12: qty 1

## 2015-03-12 MED ORDER — SODIUM CHLORIDE 0.9 % IV SOLN
100.0000 mL/h | INTRAVENOUS | Status: DC
  Administered 2015-03-12 – 2015-03-13 (×2): 100 mL/h via INTRAVENOUS
  Filled 2015-03-12 (×9): qty 1000

## 2015-03-12 MED ORDER — ONDANSETRON HCL 4 MG PO TABS
4.0000 mg | ORAL_TABLET | Freq: Four times a day (QID) | ORAL | Status: DC | PRN
  Filled 2015-03-12: qty 1

## 2015-03-12 MED ORDER — EZETIMIBE 10 MG PO TABS
10.0000 mg | ORAL_TABLET | Freq: Every evening | ORAL | Status: DC
  Administered 2015-03-12 – 2015-03-14 (×3): 10 mg via ORAL
  Filled 2015-03-12 (×4): qty 1

## 2015-03-12 MED ORDER — LACTATED RINGERS IV SOLN
INTRAVENOUS | Status: DC | PRN
  Administered 2015-03-12 (×2): via INTRAVENOUS

## 2015-03-12 MED ORDER — SODIUM CHLORIDE 0.9 % IV SOLN
2000.0000 mg | INTRAVENOUS | Status: DC | PRN
  Administered 2015-03-12: 2000 mg via TOPICAL

## 2015-03-12 MED ORDER — ALBUMIN HUMAN 5 % IV SOLN
INTRAVENOUS | Status: AC
  Filled 2015-03-12: qty 250

## 2015-03-12 MED ORDER — LINAGLIPTIN 5 MG PO TABS
5.0000 mg | ORAL_TABLET | Freq: Once | ORAL | Status: AC
  Administered 2015-03-12: 5 mg via ORAL
  Filled 2015-03-12: qty 1

## 2015-03-12 MED ORDER — DOCUSATE SODIUM 100 MG PO CAPS
100.0000 mg | ORAL_CAPSULE | Freq: Two times a day (BID) | ORAL | Status: DC
  Administered 2015-03-12 – 2015-03-15 (×6): 100 mg via ORAL

## 2015-03-12 MED ORDER — CEFAZOLIN SODIUM-DEXTROSE 2-3 GM-% IV SOLR
2.0000 g | INTRAVENOUS | Status: AC
  Administered 2015-03-12: 2 g via INTRAVENOUS

## 2015-03-12 MED ORDER — METFORMIN HCL 500 MG PO TABS
1000.0000 mg | ORAL_TABLET | Freq: Two times a day (BID) | ORAL | Status: DC
  Administered 2015-03-13 – 2015-03-15 (×5): 1000 mg via ORAL
  Filled 2015-03-12 (×7): qty 2

## 2015-03-12 MED ORDER — IRBESARTAN 75 MG PO TABS
37.5000 mg | ORAL_TABLET | Freq: Every day | ORAL | Status: DC
  Administered 2015-03-12 – 2015-03-15 (×4): 37.5 mg via ORAL
  Filled 2015-03-12 (×4): qty 0.5

## 2015-03-12 MED ORDER — HYDROMORPHONE HCL 1 MG/ML IJ SOLN: 0.5000 mg | INTRAMUSCULAR | Status: DC | PRN

## 2015-03-12 MED ORDER — MENTHOL 3 MG MT LOZG: 1.0000 | LOZENGE | OROMUCOSAL | Status: DC | PRN

## 2015-03-12 MED ORDER — PROPOFOL INFUSION 10 MG/ML OPTIME
INTRAVENOUS | Status: DC | PRN
  Administered 2015-03-12: 75 ug/kg/min via INTRAVENOUS

## 2015-03-12 MED ORDER — METOCLOPRAMIDE HCL 5 MG/ML IJ SOLN: 5.0000 mg | Freq: Three times a day (TID) | INTRAMUSCULAR | Status: DC | PRN

## 2015-03-12 MED ORDER — SODIUM CHLORIDE 0.9 % IV SOLN
10.0000 mg | INTRAVENOUS | Status: DC | PRN
  Administered 2015-03-12: 20 ug/min via INTRAVENOUS

## 2015-03-12 MED ORDER — DEXAMETHASONE SODIUM PHOSPHATE 10 MG/ML IJ SOLN: 10.0000 mg | Freq: Once | INTRAMUSCULAR | Status: DC

## 2015-03-12 MED ORDER — ONDANSETRON HCL 4 MG/2ML IJ SOLN: 4.0000 mg | Freq: Four times a day (QID) | INTRAMUSCULAR | Status: DC | PRN

## 2015-03-12 MED ORDER — METOCLOPRAMIDE HCL 10 MG PO TABS: 5.0000 mg | ORAL_TABLET | Freq: Three times a day (TID) | ORAL | Status: DC | PRN

## 2015-03-12 MED ORDER — BUPIVACAINE IN DEXTROSE 0.75-8.25 % IT SOLN
INTRATHECAL | Status: DC | PRN
  Administered 2015-03-12: 1.6 mL via INTRATHECAL

## 2015-03-12 MED ORDER — TRANEXAMIC ACID 1000 MG/10ML IV SOLN
2000.0000 mg | Freq: Once | INTRAVENOUS | Status: DC
  Filled 2015-03-12: qty 20

## 2015-03-12 MED ORDER — FENTANYL CITRATE (PF) 100 MCG/2ML IJ SOLN
INTRAMUSCULAR | Status: AC
  Filled 2015-03-12: qty 4

## 2015-03-12 MED ORDER — PHENOL 1.4 % MT LIQD: 1.0000 | OROMUCOSAL | Status: DC | PRN

## 2015-03-12 MED ORDER — ONDANSETRON HCL 4 MG/2ML IJ SOLN
INTRAMUSCULAR | Status: DC | PRN
  Administered 2015-03-12 (×2): 2 mg via INTRAVENOUS

## 2015-03-12 MED ORDER — SODIUM CHLORIDE 0.9 % IR SOLN
Status: DC | PRN
  Administered 2015-03-12: 1000 mL

## 2015-03-12 MED ORDER — POLYVINYL ALCOHOL 1.4 % OP SOLN
1.0000 [drp] | Freq: Every day | OPHTHALMIC | Status: DC | PRN
  Filled 2015-03-12: qty 15

## 2015-03-12 MED ORDER — GLIMEPIRIDE 2 MG PO TABS
2.0000 mg | ORAL_TABLET | Freq: Every day | ORAL | Status: DC
  Administered 2015-03-12 – 2015-03-15 (×4): 2 mg via ORAL
  Filled 2015-03-12 (×5): qty 1

## 2015-03-12 MED ORDER — BISACODYL 10 MG RE SUPP: 10.0000 mg | Freq: Every day | RECTAL | Status: DC | PRN

## 2015-03-12 MED ORDER — INSULIN ASPART 100 UNIT/ML ~~LOC~~ SOLN
0.0000 [IU] | Freq: Three times a day (TID) | SUBCUTANEOUS | Status: DC
  Administered 2015-03-12 (×2): 8 [IU] via SUBCUTANEOUS
  Administered 2015-03-13: 3 [IU] via SUBCUTANEOUS
  Administered 2015-03-13: 11 [IU] via SUBCUTANEOUS
  Administered 2015-03-14: 5 [IU] via SUBCUTANEOUS
  Administered 2015-03-14: 8 [IU] via SUBCUTANEOUS
  Administered 2015-03-14: 5 [IU] via SUBCUTANEOUS
  Administered 2015-03-15: 8 [IU] via SUBCUTANEOUS

## 2015-03-12 MED ORDER — ALUM & MAG HYDROXIDE-SIMETH 200-200-20 MG/5ML PO SUSP: 30.0000 mL | ORAL | Status: DC | PRN

## 2015-03-12 MED ORDER — POLYETHYLENE GLYCOL 3350 17 G PO PACK
17.0000 g | PACK | Freq: Two times a day (BID) | ORAL | Status: DC
  Administered 2015-03-12 – 2015-03-14 (×4): 17 g via ORAL

## 2015-03-12 MED ORDER — METHOCARBAMOL 500 MG PO TABS
500.0000 mg | ORAL_TABLET | Freq: Four times a day (QID) | ORAL | Status: DC | PRN
  Administered 2015-03-13 (×3): 500 mg via ORAL
  Filled 2015-03-12 (×3): qty 1

## 2015-03-12 MED ORDER — CEFAZOLIN SODIUM-DEXTROSE 2-3 GM-% IV SOLR
2.0000 g | Freq: Four times a day (QID) | INTRAVENOUS | Status: AC
  Administered 2015-03-12 (×2): 2 g via INTRAVENOUS
  Filled 2015-03-12 (×2): qty 50

## 2015-03-12 MED ORDER — FENOFIBRATE 160 MG PO TABS
160.0000 mg | ORAL_TABLET | Freq: Every day | ORAL | Status: DC
  Administered 2015-03-12 – 2015-03-14 (×3): 160 mg via ORAL
  Filled 2015-03-12 (×4): qty 1

## 2015-03-12 MED ORDER — HYDROCODONE-ACETAMINOPHEN 7.5-325 MG PO TABS
1.0000 | ORAL_TABLET | ORAL | Status: DC
  Administered 2015-03-12 – 2015-03-15 (×13): 1 via ORAL
  Filled 2015-03-12 (×2): qty 1
  Filled 2015-03-12: qty 2
  Filled 2015-03-12: qty 1
  Filled 2015-03-12: qty 2
  Filled 2015-03-12 (×3): qty 1
  Filled 2015-03-12: qty 2
  Filled 2015-03-12 (×4): qty 1

## 2015-03-12 MED ORDER — FERROUS SULFATE 325 (65 FE) MG PO TABS
325.0000 mg | ORAL_TABLET | Freq: Three times a day (TID) | ORAL | Status: DC
  Administered 2015-03-13 – 2015-03-15 (×5): 325 mg via ORAL
  Filled 2015-03-12 (×12): qty 1

## 2015-03-12 MED ORDER — ATROPINE SULFATE 0.1 MG/ML IJ SOLN
INTRAMUSCULAR | Status: AC
Start: 2015-03-12 — End: 2015-03-12
  Filled 2015-03-12: qty 10

## 2015-03-12 MED ORDER — ONDANSETRON HCL 4 MG/2ML IJ SOLN
INTRAMUSCULAR | Status: AC
  Filled 2015-03-12: qty 2

## 2015-03-12 MED ORDER — ATORVASTATIN CALCIUM 40 MG PO TABS
40.0000 mg | ORAL_TABLET | Freq: Every evening | ORAL | Status: DC
  Administered 2015-03-12 – 2015-03-14 (×3): 40 mg via ORAL
  Filled 2015-03-12 (×4): qty 1

## 2015-03-12 MED ORDER — METFORMIN HCL 500 MG PO TABS
1000.0000 mg | ORAL_TABLET | Freq: Once | ORAL | Status: AC
  Filled 2015-03-12: qty 2

## 2015-03-12 MED ORDER — ALBUMIN HUMAN 5 % IV SOLN
INTRAVENOUS | Status: DC | PRN
  Administered 2015-03-12: 08:00:00 via INTRAVENOUS

## 2015-03-12 SURGICAL SUPPLY — 44 items
BAG DECANTER FOR FLEXI CONT (MISCELLANEOUS) IMPLANT
BAG SPEC THK2 15X12 ZIP CLS (MISCELLANEOUS)
BAG ZIPLOCK 12X15 (MISCELLANEOUS) IMPLANT
CAPT HIP TOTAL 2 ×2 IMPLANT
COVER PERINEAL POST (MISCELLANEOUS) ×3 IMPLANT
DRAPE C-ARM 42X120 X-RAY (DRAPES) ×3 IMPLANT
DRAPE STERI IOBAN 125X83 (DRAPES) ×3 IMPLANT
DRAPE U-SHAPE 47X51 STRL (DRAPES) ×9 IMPLANT
DRSG AQUACEL AG ADV 3.5X10 (GAUZE/BANDAGES/DRESSINGS) ×3 IMPLANT
DURAPREP 26ML APPLICATOR (WOUND CARE) ×3 IMPLANT
ELECT BLADE TIP CTD 4 INCH (ELECTRODE) ×3 IMPLANT
ELECT PENCIL ROCKER SW 15FT (MISCELLANEOUS) IMPLANT
ELECT REM PT RETURN 15FT ADLT (MISCELLANEOUS) IMPLANT
ELECT REM PT RETURN 9FT ADLT (ELECTROSURGICAL) ×3
ELECTRODE REM PT RTRN 9FT ADLT (ELECTROSURGICAL) ×1 IMPLANT
FACESHIELD WRAPAROUND (MASK) ×12 IMPLANT
FACESHIELD WRAPAROUND OR TEAM (MASK) ×4 IMPLANT
GLOVE BIOGEL PI IND STRL 7.5 (GLOVE) ×1 IMPLANT
GLOVE BIOGEL PI IND STRL 8.5 (GLOVE) ×1 IMPLANT
GLOVE BIOGEL PI INDICATOR 7.5 (GLOVE) ×2
GLOVE BIOGEL PI INDICATOR 8.5 (GLOVE) ×2
GLOVE ECLIPSE 8.0 STRL XLNG CF (GLOVE) ×6 IMPLANT
GLOVE ORTHO TXT STRL SZ7.5 (GLOVE) ×3 IMPLANT
GOWN SPEC L3 XXLG W/TWL (GOWN DISPOSABLE) ×3 IMPLANT
GOWN STRL REUS W/TWL LRG LVL3 (GOWN DISPOSABLE) ×3 IMPLANT
HOLDER FOLEY CATH W/STRAP (MISCELLANEOUS) ×3 IMPLANT
KIT BASIN OR (CUSTOM PROCEDURE TRAY) ×3 IMPLANT
LIQUID BAND (GAUZE/BANDAGES/DRESSINGS) ×3 IMPLANT
NDL SAFETY ECLIPSE 18X1.5 (NEEDLE) IMPLANT
NEEDLE HYPO 18GX1.5 SHARP (NEEDLE)
PACK TOTAL JOINT (CUSTOM PROCEDURE TRAY) ×3 IMPLANT
PEN SKIN MARKING BROAD (MISCELLANEOUS) ×3 IMPLANT
SAW OSC TIP CART 19.5X105X1.3 (SAW) ×3 IMPLANT
SUT MNCRL AB 4-0 PS2 18 (SUTURE) ×3 IMPLANT
SUT VIC AB 1 CT1 36 (SUTURE) ×9 IMPLANT
SUT VIC AB 2-0 CT1 27 (SUTURE) ×6
SUT VIC AB 2-0 CT1 TAPERPNT 27 (SUTURE) ×2 IMPLANT
SUT VLOC 180 0 24IN GS25 (SUTURE) ×3 IMPLANT
SYR 50ML LL SCALE MARK (SYRINGE) IMPLANT
TOWEL OR 17X26 10 PK STRL BLUE (TOWEL DISPOSABLE) ×3 IMPLANT
TOWEL OR NON WOVEN STRL DISP B (DISPOSABLE) IMPLANT
TRAY FOLEY W/METER SILVER 14FR (SET/KITS/TRAYS/PACK) ×3 IMPLANT
WATER STERILE IRR 1500ML POUR (IV SOLUTION) ×3 IMPLANT
YANKAUER SUCT BULB TIP 10FT TU (MISCELLANEOUS) ×3 IMPLANT

## 2015-03-12 NOTE — Anesthesia Procedure Notes (Signed)
Spinal Patient location during procedure: OR Start time: 03/12/2015 7:24 AM End time: 03/12/2015 7:30 AM Staffing Resident/CRNA: Dion Saucier E Performed by: resident/CRNA  Preanesthetic Checklist Completed: patient identified, site marked, surgical consent, pre-op evaluation, timeout performed, IV checked, risks and benefits discussed and monitors and equipment checked Spinal Block Patient position: sitting Prep: Betadine and site prepped and draped Patient monitoring: heart rate, continuous pulse ox and blood pressure Approach: midline Location: L3-4 Injection technique: single-shot Needle Needle type: Spinocan  Needle gauge: 22 G Needle length: 9 cm Additional Notes Kit expiration date checked.  Negative heme, paresthesias, good flow, tolerated well.

## 2015-03-12 NOTE — Op Note (Signed)
NAME:  Cynthia [name]                ACCOUNT NO.: [id number]      MEDICAL RECORD NO.: [id number]      FACILITY:  [facility]      PHYSICIAN:  Paralee Cancel D  DATE OF BIRTH:  09-Dec-1931     DATE OF PROCEDURE:  03/12/2015                                 OPERATIVE REPORT         PREOPERATIVE DIAGNOSIS: Right  hip osteoarthritis.      POSTOPERATIVE DIAGNOSIS:  Right hip osteoarthritis.      PROCEDURE:  Right total hip replacement through an anterior approach   utilizing DePuy THR system, component size 65mm pinnacle cup, a size 32+4 neutral   Altrex liner, a size 4 standard Tri Lock stem with a 32+5 delta ceramic   ball.      SURGEON:  Pietro Cassis. Alvan Dame, M.D.      ASSISTANT:  Danae Orleans, PA-C      ANESTHESIA:  Spinal.      SPECIMENS:  None.      COMPLICATIONS:  None.      BLOOD LOSS:  200 cc     DRAINS:  None.      INDICATION OF THE PROCEDURE:  [name] is a 81 y.o. female who had   presented to office for evaluation of right hip pain.  Radiographs revealed   progressive degenerative changes with bone-on-bone   articulation to the  hip joint.  The patient had painful limited range of   motion significantly affecting their overall quality of life.  The patient was failing to    respond to conservative measures, and at this point was ready   to proceed with more definitive measures.  The patient has noted progressive   degenerative changes in his hip, progressive problems and dysfunction   with regarding the hip prior to surgery.  Consent was obtained for   benefit of pain relief.  Specific risk of infection, DVT, component   failure, dislocation, need for revision surgery, as well discussion of   the anterior versus posterior approach were reviewed.  Consent was   obtained for benefit of anterior pain relief through an anterior   approach.      PROCEDURE IN DETAIL:  The patient was brought to operative theater.   Once adequate anesthesia,  preoperative antibiotics, 2gm of Ancef and 10mg  of Decadron administered.   The patient was positioned supine on the OSI [place] table.  Once adequate   padding of boney process was carried out, we had predraped out the hip, and  used fluoroscopy to confirm orientation of the pelvis and position.      The right hip was then prepped and draped from proximal iliac crest to   mid thigh with shower curtain technique.      Time-out was performed identifying the patient, planned procedure, and   extremity.     An incision was then made 2 cm distal and lateral to the   anterior superior iliac spine extending over the orientation of the   tensor fascia lata muscle and sharp dissection was carried down to the   fascia of the muscle and protractor placed in the soft tissues.      The fascia was then incised.  The muscle  belly was identified and swept   laterally and retractor placed along the superior neck.  Following   cauterization of the circumflex vessels and removing some pericapsular   fat, a second cobra retractor was placed on the inferior neck.  A third   retractor was placed on the anterior acetabulum after elevating the   anterior rectus.  A L-capsulotomy was along the line of the   superior neck to the trochanteric fossa, then extended proximally and   distally.  Tag sutures were placed and the retractors were then placed   intracapsular.  We then identified the trochanteric fossa and   orientation of my neck cut, confirmed this radiographically   and then made a neck osteotomy with the femur on traction.  The femoral   head was removed without difficulty or complication.  Traction was let   off and retractors were placed posterior and anterior around the   acetabulum.      The labrum and foveal tissue were debrided.  I began reaming with a 24mm   reamer and reamed up to 63mm reamer with good bony bed preparation and a 25mm   cup was chosen.  The final 76mm Pinnacle cup was then  impacted under fluoroscopy  to confirm the depth of penetration and orientation with respect to   abduction.  A screw was placed followed by the hole eliminator.  The final   32+4 neutral Altrex liner was impacted with good visualized rim fit.  The cup was positioned anatomically within the acetabular portion of the pelvis.      At this point, the femur was rolled at 80 degrees.  Further capsule was   released off the inferior aspect of the femoral neck.  I then   released the superior capsule proximally.  The hook was placed laterally   along the femur and elevated manually and held in position with the bed   hook.  The leg was then extended and adducted with the leg rolled to 100   degrees of external rotation.  Once the proximal femur was fully   exposed, I used a box osteotome to set orientation.  I then began   broaching with the starting chili pepper broach and passed this by hand and then broached up to 4.  With the 4 broach in place I chose a standard neck and did several trial reductions.  The offset was appropriate, leg lengths   appeared to be equal, confirmed radiographically with the +5 head ball.   Given these findings, I went ahead and dislocated the hip, repositioned all   retractors and positioned the right hip in the extended and abducted position.  The final 4 standard Tri Lock stem was   chosen and it was impacted down to the level of neck cut.  Based on this   and the trial reduction, a 32+5 delta ceramic head ball was chosen and   impacted onto a clean and dry trunnion, and the hip was reduced.  The   hip had been irrigated throughout the case again at this point.  I did   reapproximate the superior capsular leaflet to the anterior leaflet   using #1 Vicryl.  The fascia of the   tensor fascia lata muscle was then reapproximated using #1 Vicryl.  The   remaining wound was closed with 2-0 Vicryl and running 4-0 Monocryl.   The hip was cleaned, dried, and dressed sterilely  using Dermabond and   Aquacel dressing.[name]  She  was then brought   to recovery room in stable condition tolerating the procedure well.    Danae Orleans, PA-C was present for the entirety of the case involved from   preoperative positioning, perioperative retractor management, general   facilitation of the case, as well as primary wound closure as assistant.            Pietro Cassis Alvan Dame, M.D.        03/12/2015 8:41 AM

## 2015-03-12 NOTE — Anesthesia Preprocedure Evaluation (Signed)
Anesthesia Evaluation  Patient identified by MRN, date of birth, ID band Patient awake    Reviewed: Allergy & Precautions, NPO status , Patient's Chart, lab work & pertinent test results  Airway Mallampati: II  TM Distance: >3 FB Neck ROM: Full    Dental   Pulmonary neg pulmonary ROS,  breath sounds clear to auscultation        Cardiovascular hypertension, + CAD and + Past MI + dysrhythmias Rhythm:Regular Rate:Normal     Neuro/Psych    GI/Hepatic negative GI ROS, Neg liver ROS,   Endo/Other  diabetes  Renal/GU negative Renal ROS     Musculoskeletal   Abdominal   Peds  Hematology   Anesthesia Other Findings   Reproductive/Obstetrics                             Anesthesia Physical Anesthesia Plan  ASA: III  Anesthesia Plan: Spinal   Post-op Pain Management:    Induction: Intravenous  Airway Management Planned: Simple Face Mask  Additional Equipment:   Intra-op Plan:   Post-operative Plan:   Informed Consent: I have reviewed the patients History and Physical, chart, labs and discussed the procedure including the risks, benefits and alternatives for the proposed anesthesia with the patient or authorized representative who has indicated his/her understanding and acceptance.   Dental advisory given  Plan Discussed with: CRNA and Anesthesiologist  Anesthesia Plan Comments:         Anesthesia Quick Evaluation

## 2015-03-12 NOTE — Anesthesia Postprocedure Evaluation (Signed)
  Anesthesia Post-op Note  Patient: Cynthia [name]  Procedure(s) Performed: Procedure(s): RIGHT TOTAL HIP ARTHROPLASTY ANTERIOR APPROACH (Right)  Patient Location: PACU  Anesthesia Type:Spinal  Level of Consciousness: awake  Airway and Oxygen Therapy: Patient Spontanous Breathing  Post-op Pain: none  Post-op Assessment: Post-op Vital signs reviewed   LLE Sensation: Decreased   RLE Sensation: Decreased L Sensory Level: L3-Anterior knee, lower leg R Sensory Level: L3-Anterior knee, lower leg  Post-op Vital Signs: Reviewed  Last Vitals:  Filed Vitals:   03/12/15 1235  BP: 131/61  Pulse: 69  Temp: 36.4 C  Resp: 16    Complications: No apparent anesthesia complications

## 2015-03-12 NOTE — Evaluation (Signed)
Physical Therapy Evaluation Patient Details Name: Cynthia [name] MRN: [id number] DOB: Jan 03, 1932 Today's Date: 03/12/2015   History of Present Illness  81 yo female s/p R THA-direct anterior 03/12/15. Hx of DM. Pt lives alone  Clinical Impression  On eval, POD 0, pt was Min assist for mobility-performed stand pivot from bed to recliner with RW. Activity limited by weakness, lightheadedness. Pt politely declined ambulation on today. Discussed d/c plan-pt lives alone but states she will arrange to have assistance at home. Recommend HHPT and 24 hour supervision initially.     Follow Up Recommendations Home health PT;Supervision/Assistance - 24 hour (initially)    Equipment Recommendations  None recommended by PT (Pt states she plans to use husband's walker)    Recommendations for Other Services       Precautions / Restrictions Precautions Precautions: Fall Restrictions Weight Bearing Restrictions: No      Mobility  Bed Mobility Overal bed mobility: Needs Assistance Bed Mobility: Supine to Sit     Supine to sit: Min assist     General bed mobility comments: Assist for R LE. Increased time. VCs safety, technique, hand placement  Transfers Overall transfer level: Needs assistance Equipment used: Rolling walker (2 wheeled) Transfers: Sit to/from [facility] Sit to Stand: Min assist Stand pivot transfers: Min assist       General transfer comment: assist to rise, stabilize, control descent. VCs safety, technique, hand placement. Stand pivot, bed to recliner, with RW.   Ambulation/Gait             General Gait Details: Pt declined ambulation on today-pt felt weak, lightheaded.  Stairs            Wheelchair Mobility    Modified Rankin (Stroke Patients Only)       Balance Overall balance assessment: Needs assistance         Standing balance support: Bilateral upper extremity supported;During functional activity Standing balance-Leahy  Scale: Poor                               Pertinent Vitals/Pain Pain Assessment: 0-10 Pain Score: 3  Pain Location: R hip/thigh  Pain Descriptors / Indicators: Sore Pain Intervention(s): Monitored during session;Repositioned    Home Living Family/patient expects to be discharged to:: Private residence Living Arrangements: Alone Available Help at Discharge: Family Type of Home: House Home Access: Ramped entrance     Home Layout: One level Home Equipment: Environmental consultant - 2 wheels;Cane - single point;Wheelchair - manual      Prior Function Level of Independence: Independent with assistive device(s)         Comments: uses cane intermittently     Hand Dominance        Extremity/Trunk Assessment   Upper Extremity Assessment: Defer to OT evaluation           Lower Extremity Assessment: Generalized weakness      Cervical / Trunk Assessment: Normal  Communication   Communication: No difficulties  Cognition Arousal/Alertness: Awake/alert Behavior During Therapy: WFL for tasks assessed/performed Overall Cognitive Status: Within Functional Limits for tasks assessed                      General Comments      Exercises        Assessment/Plan    PT Assessment Patient needs continued PT services  PT Diagnosis Difficulty walking;Acute pain;Generalized weakness   PT Problem List Decreased strength;Decreased range  of motion;Decreased activity tolerance;Decreased balance;Decreased mobility;Decreased knowledge of use of DME;Pain  PT Treatment Interventions DME instruction;Gait training;Functional mobility training;Patient/family education;Balance training;Therapeutic exercise   PT Goals (Current goals can be found in the Care Plan section) Acute Rehab PT Goals Patient Stated Goal: home PT Goal Formulation: With patient/family Time For Goal Achievement: 03/12/15 Potential to Achieve Goals: Good    Frequency 7X/week   Barriers to discharge         Co-evaluation               End of Session   Activity Tolerance: Patient tolerated treatment well Patient left: in chair;with call bell/phone within reach;with family/visitor present           Time: 1513-1530 PT Time Calculation (min) (ACUTE ONLY): 17 min   Charges:   PT Evaluation $Initial PT Evaluation Tier I: 1 Procedure     PT G Codes:        Weston Anna, MPT Pager: [phone]

## 2015-03-12 NOTE — Transfer of Care (Signed)
Immediate Anesthesia Transfer of Care Note  Patient: Cynthia [name]  Procedure(s) Performed: Procedure(s): RIGHT TOTAL HIP ARTHROPLASTY ANTERIOR APPROACH (Right)  Patient Location: PACU  Anesthesia Type:Spinal  Level of Consciousness: awake, oriented, patient cooperative, lethargic and responds to stimulation  Airway & Oxygen Therapy: Patient Spontanous Breathing and Patient connected to face mask oxygen  Post-op Assessment: Report given to RN and Post -op Vital signs reviewed and stable  Post vital signs: Reviewed and stable  Last Vitals:  Filed Vitals:   03/12/15 0503  BP: 175/73  Pulse: 83  Temp: 36.4 C  Resp: 18    Complications: No apparent anesthesia complications

## 2015-03-12 NOTE — Interval H&P Note (Signed)
History and Physical Interval Note:  03/12/2015 7:04 AM  Cynthia [name]  has presented today for surgery, with the diagnosis of right hip osteoarthritis  The various methods of treatment have been discussed with the patient and family. After consideration of risks, benefits and other options for treatment, the patient has consented to  Procedure(s): RIGHT TOTAL HIP ARTHROPLASTY ANTERIOR APPROACH (Right) as a surgical intervention .  The patient's history has been reviewed, patient examined, no change in status, stable for surgery.  I have reviewed the patient's chart and labs.  Questions were answered to the patient's satisfaction.     Mauri Pole

## 2015-03-13 ENCOUNTER — Encounter (HOSPITAL_COMMUNITY): Payer: Self-pay | Admitting: Orthopedic Surgery

## 2015-03-13 LAB — BASIC METABOLIC PANEL
ANION GAP: 5 (ref 5–15)
BUN: 15 mg/dL (ref 6–20)
CALCIUM: 8.3 mg/dL — AB (ref 8.9–10.3)
CO2: 24 mmol/L (ref 22–32)
Chloride: 106 mmol/L (ref 101–111)
Creatinine, Ser: 0.67 mg/dL (ref 0.44–1.00)
GFR calc Af Amer: >60 mL/min (ref >60)
GFR calc non Af Amer: >60 mL/min (ref >60)
GLUCOSE: 247 mg/dL — AB (ref 65–99)
Potassium: 4.1 mmol/L (ref 3.5–5.1)
Sodium: 135 mmol/L (ref 135–145)

## 2015-03-13 LAB — GLUCOSE, CAPILLARY
Glucose-Capillary: 177 mg/dL — ABNORMAL HIGH (ref 65–99)
Glucose-Capillary: 307 mg/dL — ABNORMAL HIGH (ref 65–99)
Glucose-Capillary: 162 mg/dL — ABNORMAL HIGH (ref 65–99)
Glucose-Capillary: 138 mg/dL — ABNORMAL HIGH (ref 65–99)
Glucose-Capillary: 150 mg/dL — ABNORMAL HIGH (ref 65–99)

## 2015-03-13 LAB — CBC
HEMATOCRIT: 27.2 % — AB (ref 36.0–46.0)
Hemoglobin: 9.1 g/dL — ABNORMAL LOW (ref 12.0–15.0)
MCH: 30 pg (ref 26.0–34.0)
MCHC: 33.5 g/dL (ref 30.0–36.0)
MCV: 89.8 fL (ref 78.0–100.0)
PLATELETS: 113 10*3/uL — AB (ref 150–400)
RBC: 3.03 MIL/uL — ABNORMAL LOW (ref 3.87–5.11)
RDW: 14.2 % (ref 11.5–15.5)
WBC: 5.1 10*3/uL (ref 4.0–10.5)

## 2015-03-13 NOTE — Evaluation (Signed)
Occupational Therapy Evaluation Patient Details Name: EVOLET SALMINEN MRN: [id number] DOB: 06-18-1932 Today's Date: 03/13/2015    History of Present Illness 81 yo female s/p R THA-direct anterior 03/12/15. Hx of DM. Pt lives alone   Clinical Impression   Pt was admitted for the above.  She was mod I with adls prior to admission and needs up to max A for LB adls.  She will benefit from skilled OT to increase safety and independence with adls. Will focus on bed mobility and toileting as pt wants to be able to stay at night by herself.  She lives alone and plans intermittent assistance. Recommend follow up [facility]    Follow Up Recommendations  Home health OT    Equipment Recommendations  None recommended by OT (pt want to use her high commode and grab bar) -- will continue to assess   Recommendations for Other Services       Precautions / Restrictions Precautions Precautions: Fall Restrictions Weight Bearing Restrictions: No      Mobility Bed Mobility   Bed Mobility: Supine to Sit     Supine to sit: Min assist     General bed mobility comments: Assist for R LE. Increased time. VCs safety, technique, hand placement  Transfers   Equipment used: Rolling walker (2 wheeled) Transfers: Sit to/from Stand Sit to Stand: Min assist         General transfer comment: assist to rise, stabilize, control descent. VCs safety, technique, hand placement.      Balance                                            ADL Overall ADL's : Needs assistance/impaired     Grooming: Oral care;Set up;Sitting   Upper Body Bathing: Set up;Sitting   Lower Body Bathing: Moderate assistance;Sit to/from stand   Upper Body Dressing : Set up;Sitting   Lower Body Dressing: Maximal assistance;Sit to/from stand                 General ADL Comments: completed adl from EOB.  Pt still felt funny after pain medication and wanted to return to lying down.  Pt was unable to release  a hand in standing to pull pants up: she donned own clothing/underwear  Reviewed working within pain tolerance. She will have niece and friends assist her at home.  has Secondary school teacher.  Will have help for socks.  Pt feels she will be able to get to her commode as it is high and she has grab bars.  Husband was in a w/c when he was alive.  She told me niece will stay first night; she said if she needs more assistance at night, she believes niece will stay longer     Vision     Perception     Praxis      Pertinent Vitals/Pain Pain Assessment: 0-10 Pain Score: 5  Pain Location: right hip Pain Descriptors / Indicators: Burning;Sore Pain Intervention(s): Limited activity within patient's tolerance;Monitored during session;Premedicated before session;Repositioned;Ice applied     Hand Dominance     Extremity/Trunk Assessment Upper Extremity Assessment Upper Extremity Assessment: Overall WFL for tasks assessed           Communication Communication Communication: No difficulties   Cognition Arousal/Alertness: Awake/alert Behavior During Therapy: WFL for tasks assessed/performed Overall Cognitive Status: Within Functional Limits for tasks assessed  General Comments       Exercises       Shoulder Instructions      Home Living Family/patient expects to be discharged to:: Private residence Living Arrangements: Alone Available Help at Discharge: Family Type of Home: House             Bathroom Shower/Tub: Walk-in shower   Bathroom Toilet: Handicapped height     Home Equipment: Environmental consultant - 2 wheels;Cane - single point;Wheelchair - manual;Grab bars - toilet;Grab bars - tub/shower;Shower seat          Prior Functioning/Environment Level of Independence: Independent with assistive device(s)             OT Diagnosis: Generalized weakness;Acute pain   OT Problem List: Decreased strength;Decreased activity tolerance;Impaired balance (sitting and/or  standing);Decreased safety awareness;Decreased knowledge of use of DME or AE;Pain   OT Treatment/Interventions: Self-care/ADL training;DME and/or AE instruction;Patient/family education    OT Goals(Current goals can be found in the care plan section) Acute Rehab OT Goals Patient Stated Goal: home OT Goal Formulation: With patient Time For Goal Achievement: 03/20/15 Potential to Achieve Goals: Good ADL Goals Pt Will Perform Grooming: with supervision;standing Pt Will Transfer to Toilet: with supervision;ambulating;grab bars (high commode) Pt Will Perform Toileting - Clothing Manipulation and hygiene: with supervision;sit to/from stand Pt Will Perform Tub/Shower Transfer: Shower transfer;with min guard assist;shower seat;ambulating;grab bars Additional ADL Goal #1: pt will complete bed mobility at supervision level in preparation for toilet transfers  OT Frequency: Min 2X/week   Barriers to D/C:            Co-evaluation              End of Session    Activity Tolerance:  (pt with some dizziness possibly from pain meds) Patient left: in bed;with call bell/phone within reach   Time: 0836-0909 OT Time Calculation (min): 33 min Charges:  OT General Charges $OT Visit: 1 Procedure OT Evaluation $Initial OT Evaluation Tier I: 1 Procedure OT Treatments $Self Care/Home Management : 8-22 mins G-Codes:    SPENCER,MARYELLEN April 05, 2015, 9:18 AM  Lesle Chris, OTR/L [phone] April 05, 2015

## 2015-03-13 NOTE — Progress Notes (Signed)
     Subjective: 1 Day Post-Op Procedure(s) (LRB): RIGHT TOTAL HIP ARTHROPLASTY ANTERIOR APPROACH (Right)   Patient reports pain as mild, pain controlled. No events throughout the night.  States she had a little dizziness yesterday, but things she can associate it with the IV pain medication.  Objective:   VITALS:   Filed Vitals:   03/13/15  BP: 123/60  Pulse: 67  Temp: 97.8 F (36.6 C)   Resp: 16    Dorsiflexion/Plantar flexion intact Incision: dressing C/D/I No cellulitis present Compartment soft  LABS  Recent Labs  03/13/15 0403  HGB 9.1*  HCT 27.2*  WBC 5.1  PLT 113*     Recent Labs  03/13/15 0403  NA 135  K 4.1  BUN 15  CREATININE 0.67  GLUCOSE 247*     Assessment/Plan: 1 Day Post-Op Procedure(s) (LRB): RIGHT TOTAL HIP ARTHROPLASTY ANTERIOR APPROACH (Right) Foley cath d/c'ed Advance diet Up with therapy D/C IV fluids Discharge home with home health eventually, when ready        West Pugh. Erline Siddoway   PAC  03/13/2015, 9:34 AM

## 2015-03-13 NOTE — Progress Notes (Signed)
Physical Therapy Treatment Patient Details Name: Cynthia [name] MRN: [id number] DOB: April 10, 1932 Today's Date: 03/13/2015    History of Present Illness 81 yo female s/p R THA-direct anterior 03/12/15. Hx of DM. Pt lives alone    PT Comments    Progressing with mobility. Fatigues fairly easily with increased activity.   Follow Up Recommendations  Home health PT;Supervision/Assistance - 24 hour (initially)     Equipment Recommendations  None recommended by PT    Recommendations for Other Services       Precautions / Restrictions Precautions Precautions: Fall Restrictions Weight Bearing Restrictions: No    Mobility  Bed Mobility Overal bed mobility: Needs Assistance Bed Mobility: Supine to Sit     Supine to sit: Min guard;HOB elevated     General bed mobility comments: Increased time. close guard for safety  Transfers Overall transfer level: Needs assistance Equipment used: Rolling walker (2 wheeled) Transfers: Sit to/from Stand Sit to Stand: Min assist         General transfer comment: assist to rise, stabilize, control descent. VCs safety, technique, hand placement.    Ambulation/Gait Ambulation/Gait assistance: Min assist Ambulation Distance (Feet): 75 Feet Assistive device: Rolling walker (2 wheeled) Gait Pattern/deviations: Step-to pattern;Step-through pattern;Decreased stride length;Antalgic     General Gait Details: slow gait speed. Intermittent assist to stabilize. Fatigues fairly easily   Financial trader Rankin (Stroke Patients Only)       Balance                                    Cognition Arousal/Alertness: Awake/alert Behavior During Therapy: WFL for tasks assessed/performed Overall Cognitive Status: Within Functional Limits for tasks assessed                      Exercises Total Joint Exercises Ankle Circles/Pumps: AROM;Both;10 reps;Supine Quad Sets: AROM;Both;10  reps;Supine Heel Slides: AAROM;Right;10 reps;Supine Hip ABduction/ADduction: AAROM;Right;10 reps;Supine    General Comments        Pertinent Vitals/Pain Pain Assessment: 0-10 Pain Score: 5  Pain Location: R thigh/LE Pain Descriptors / Indicators: Sore;Burning Pain Intervention(s): Monitored during session;Ice applied;Repositioned    Home Living Family/patient expects to be discharged to:: Private residence Living Arrangements: Alone Available Help at Discharge: Family Type of Home: House       Home Equipment: Gilford Rile - 2 wheels;Cane - single point;Wheelchair - manual;Grab bars - toilet;Grab bars - tub/shower;Shower seat      Prior Function Level of Independence: Independent with assistive device(s)          PT Goals (current goals can now be found in the care plan section) Acute Rehab PT Goals Patient Stated Goal: home Progress towards PT goals: Progressing toward goals    Frequency  7X/week    PT Plan Current plan remains appropriate    Co-evaluation             End of Session Equipment Utilized During Treatment: Gait belt Activity Tolerance: Patient tolerated treatment well Patient left: in chair;with call bell/phone within reach;with family/visitor present     Time: 1139-1200 PT Time Calculation (min) (ACUTE ONLY): 21 min  Charges:  $Gait Training: 8-22 mins                    G Codes:      Weston Anna, MPT Pager:  [phone]    

## 2015-03-13 NOTE — Care Management Note (Signed)
Case Management Note  Patient Details  Name: MCKINSLEY KOELZER MRN: [id number] Date of Birth: October 21, 1931  Subjective/Objective:                   RIGHT TOTAL HIP ARTHROPLASTY ANTERIOR APPROACH (Right) Action/Plan: Discharge planning  Expected Discharge Date:  03/14/15               Expected Discharge Plan:  [place]  In-House Referral:     Discharge planning Services  CM Consult  Post Acute Care Choice:  Home Health Choice offered to:  Patient  DME Arranged:  N/A DME Agency:  NA  HH Arranged:  PT HH Agency:  [facility]  Status of Service:  Completed, signed off  Medicare Important Message Given:    Date Medicare IM Given:    Medicare IM give by:    Date Additional Medicare IM Given:    Additional Medicare Important Message give by:     If discussed at [place] of Stay Meetings, dates discussed:    Additional Comments: CM met with pt in room to offer choice of home health agency.  Pt chooses [facility] to render HHPT.  Pt has a rolling walker and bar supports in bathroom.  Address and contact information verified by pt.  Referral given to [facility] rep, Tim (on unit).  No other CM needs were communicated. Dellie Catholic, RN 03/13/2015, 1:24 PM

## 2015-03-13 NOTE — Progress Notes (Signed)
Physical Therapy Treatment Patient Details Name: Cynthia [name] MRN: [id number] DOB: [birth year] Today's Date: 03/13/2015    History of Present Illness 81 yo female s/p R THA-direct anterior 03/12/15. Hx of DM. Pt lives alone    PT Comments    PRogressing well with mobility.   Follow Up Recommendations  Home health PT;Supervision/Assistance - 24 hour (initially)     Equipment Recommendations  None recommended by PT    Recommendations for Other Services       Precautions / Restrictions Precautions Precautions: Fall Restrictions Weight Bearing Restrictions: No    Mobility  Bed Mobility Overal bed mobility: Needs Assistance Bed Mobility: Supine to Sit     Supine to sit: Min guard;HOB elevated     General bed mobility comments: Increased time. close guard for safety  Transfers Overall transfer level: Needs assistance Equipment used: Rolling walker (2 wheeled) Transfers: Sit to/from Stand Sit to Stand: Min guard         General transfer comment: close guard for safety.  VCs safety, technique, hand placement.    Ambulation/Gait Ambulation/Gait assistance: Min guard Ambulation Distance (Feet): 125 Feet Assistive device: Rolling walker (2 wheeled) Gait Pattern/deviations: Step-to pattern;Step-through pattern;Decreased stride length     General Gait Details: slow gait speed. Intermittent assist to stabilize.    Stairs            Wheelchair Mobility    Modified Rankin (Stroke Patients Only)       Balance           Standing balance support: Bilateral upper extremity supported;During functional activity Standing balance-Leahy Scale: Poor                      Cognition Arousal/Alertness: Awake/alert Behavior During Therapy: WFL for tasks assessed/performed Overall Cognitive Status: Within Functional Limits for tasks assessed                      Exercises     General Comments        Pertinent Vitals/Pain Pain  Assessment: 0-10 Pain Score: 5  Pain Location: R thigh/LE Pain Descriptors / Indicators: Sore Pain Intervention(s): Monitored during session;Repositioned    Home Living                      Prior Function            PT Goals (current goals can now be found in the care plan section) Progress towards PT goals: Progressing toward goals    Frequency  7X/week    PT Plan Current plan remains appropriate    Co-evaluation             End of Session Equipment Utilized During Treatment: Gait belt Activity Tolerance: Patient tolerated treatment well Patient left: in bed;with call bell/phone within reach     Time: 1401-1424 PT Time Calculation (min) (ACUTE ONLY): 23 min  Charges:  $Gait Training: 8-22 mins                    G Codes:      Weston Anna, MPT Pager: [phone]

## 2015-03-14 LAB — GLUCOSE, CAPILLARY
GLUCOSE-CAPILLARY: 325 mg/dL — AB (ref 65–99)
Glucose-Capillary: 230 mg/dL — ABNORMAL HIGH (ref 65–99)
Glucose-Capillary: 258 mg/dL — ABNORMAL HIGH (ref 65–99)
Glucose-Capillary: 207 mg/dL — ABNORMAL HIGH (ref 65–99)
Glucose-Capillary: 155 mg/dL — ABNORMAL HIGH (ref 65–99)

## 2015-03-14 LAB — CBC
HCT: 29.2 % — ABNORMAL LOW (ref 36.0–46.0)
HEMOGLOBIN: 9.4 g/dL — AB (ref 12.0–15.0)
MCH: 28.5 pg (ref 26.0–34.0)
MCHC: 32.2 g/dL (ref 30.0–36.0)
MCV: 88.5 fL (ref 78.0–100.0)
PLATELETS: 113 10*3/uL — AB (ref 150–400)
RBC: 3.3 MIL/uL — ABNORMAL LOW (ref 3.87–5.11)
RDW: 14.3 % (ref 11.5–15.5)
WBC: 4.7 10*3/uL (ref 4.0–10.5)

## 2015-03-14 LAB — BASIC METABOLIC PANEL
Anion gap: 5 (ref 5–15)
BUN: 17 mg/dL (ref 6–20)
CO2: 25 mmol/L (ref 22–32)
Calcium: 8.5 mg/dL — ABNORMAL LOW (ref 8.9–10.3)
Chloride: 105 mmol/L (ref 101–111)
Creatinine, Ser: 0.63 mg/dL (ref 0.44–1.00)
GFR calc Af Amer: >60 mL/min (ref >60)
GFR calc non Af Amer: >60 mL/min (ref >60)
Glucose, Bld: 288 mg/dL — ABNORMAL HIGH (ref 65–99)
Potassium: 4.4 mmol/L (ref 3.5–5.1)
Sodium: 135 mmol/L (ref 135–145)

## 2015-03-14 MED ORDER — HYDROCODONE-ACETAMINOPHEN 7.5-325 MG PO TABS: 1.0000 | ORAL_TABLET | ORAL | Status: DC | PRN

## 2015-03-14 MED ORDER — ASPIRIN 325 MG PO TBEC: 325.0000 mg | DELAYED_RELEASE_TABLET | Freq: Two times a day (BID) | ORAL | Status: AC

## 2015-03-14 MED ORDER — TIZANIDINE HCL 4 MG PO TABS: 4.0000 mg | ORAL_TABLET | Freq: Four times a day (QID) | ORAL | Status: DC | PRN

## 2015-03-14 NOTE — Progress Notes (Addendum)
Physical Therapy Treatment Patient Details Name: Cynthia [name] MRN: [id number] DOB: 17-Mar-1932 Today's Date: 03/14/2015    History of Present Illness 81 yo female s/p R THA-direct anterior 03/12/15. Hx of DM. Pt lives alone    PT Comments    Continuing to progress well.   Follow Up Recommendations  Home health PT;Intermittent supervision/assist     Equipment Recommendations  None recommended by PT    Recommendations for Other Services       Precautions / Restrictions Precautions Precautions: Fall Restrictions Weight Bearing Restrictions: No    Mobility  Bed Mobility               General bed mobility comments: OOB in recliner  Transfers Overall transfer level: Needs assistance Equipment used: Rolling walker (2 wheeled) Transfers: Sit to/from Stand Sit to Stand: Supervision         General transfer comment: VCs hand placement  Ambulation/Gait Ambulation/Gait assistance: Supervision;Min guard Ambulation Distance (Feet): 135 Feet Assistive device: Rolling walker (2 wheeled) Gait Pattern/deviations: Step-to pattern;Step-through pattern;Decreased stride length;Antalgic     General Gait Details: slow but steady.    Stairs            Wheelchair Mobility    Modified Rankin (Stroke Patients Only)       Balance                                    Cognition Arousal/Alertness: Awake/alert Behavior During Therapy: WFL for tasks assessed/performed Overall Cognitive Status: Within Functional Limits for tasks assessed                      Exercises General Exercises - Lower Extremity Ankle Circles/Pumps: AROM;Both;10 reps;Supine Quad Sets: AROM;Both;10 reps;Supine Heel Slides: AAROM;Right;10 reps;Supine Hip ABduction/ADduction: AAROM;Right;10 reps;Supine    General Comments        Pertinent Vitals/Pain Pain Assessment: 0-10 Pain Score: 5  Pain Location: R thigh Pain Descriptors / Indicators: Sore Pain  Intervention(s): Monitored during session;Ice applied;Repositioned    Home Living                      Prior Function            PT Goals (current goals can now be found in the care plan section) Progress towards PT goals: Progressing toward goals    Frequency  7X/week    PT Plan Current plan remains appropriate    Co-evaluation             End of Session   Activity Tolerance: Patient tolerated treatment well Patient left: in chair;with call bell/phone within reach     Time: 0931-0952 PT Time Calculation (min) (ACUTE ONLY): 21 min  Charges:  $Gait Training: 8-22 mins                    G Codes:      Weston Anna, MPT Pager: [phone]

## 2015-03-14 NOTE — Care Management Important Message (Signed)
Important Message  Patient Details  Name: Cynthia [name] MRN: [id number] Date of Birth: 12-29-1931   Medicare Important Message Given:  Yes-third notification given    Camillo Flaming 03/14/2015, 2:10 [place] Message  Patient Details  Name: Cynthia [name] MRN: [id number] Date of Birth: 05/20/1932   Medicare Important Message Given:  Yes-third notification given    Camillo Flaming 03/14/2015, 2:10 PM

## 2015-03-14 NOTE — Progress Notes (Signed)
   03/14/15 1800  Clinical Encounter Type  Visited With Patient  Visit Type Spiritual support;Social support  Referral From Patient's clergy (Excell Seltzer, [place], [place])  [place] [facility];Grief support   Cynthia [name] was in good spirits this evening, reporting a busy day yesterday because of so many visitors.  She welcomed pastoral presence and remembered me from a previous pastoral encounter with her husband.  The first anniversary of his death was in March 09, 2023; pt became tearful as she shared and processed some of the story of his decline, her caregiving, and the effects of the shift in their relationship due to that role change.  She used opportunity well to reflect and integrate aspects of grief and healing.  She reports good support from her husband's niece and nephew, who will stay with her this weekend for assistance.  Her [employer]) is also supportive.  Provided pastoral reflection, bereavement support, normalization of feelings, encouragement, and prayer per pt request.  Pt verbalized gratitude for care and support.  [place], [place] Pager [phone] [facility] 24/7 pager  [phone]

## 2015-03-14 NOTE — Progress Notes (Signed)
Occupational Therapy Treatment Patient Details Name: Cynthia [name] MRN: [id number] DOB: June 13, 1932 Today's Date: 03/14/2015    History of present illness 81 yo female s/p R THA-direct anterior 03/12/15. Hx of DM. Pt lives alone   OT comments  Pt is making good progress in OT.  Recommend continued HHOT as pt lives alone and wants to be as independent as possible, as quickly as possible  Follow Up Recommendations  Home health OT    Equipment Recommendations  None recommended by OT    Recommendations for Other Services      Precautions / Restrictions Precautions Precautions: Fall Restrictions Weight Bearing Restrictions: No       Mobility Bed Mobility               General bed mobility comments: pt was sitting EOB  Transfers   Equipment used: Rolling walker (2 wheeled) Transfers: Sit to/from Stand Sit to Stand: Supervision         General transfer comment: cues for UE/LE placement    Balance                                   ADL       Grooming: Wash/dry hands;Supervision/safety;Standing                   Toilet Transfer: Min guard;Ambulation;Comfort height toilet;Grab bars   Toileting- Clothing Manipulation and Hygiene: Min guard;Sit to/from stand   Tub/ Shower Transfer: Walk-in shower;Min guard;Ambulation;Rolling walker     General ADL Comments: Assisted pt with completing LB bathing/dressing (mod A; she had started this with NT.  Pt needed cues for sequence when ambulating to bathroom and back to chair; she does move slowly and carefully, attending to safety      Vision                     Perception     Praxis      Cognition   Behavior During Therapy: [facility] for tasks assessed/performed Overall Cognitive Status: Within Functional Limits for tasks assessed                       Extremity/Trunk Assessment               Exercises     Shoulder Instructions       General Comments       Pertinent Vitals/ Pain       Pain Score: 5  Pain Location: R thigh Pain Descriptors / Indicators: Sore Pain Intervention(s): Limited activity within patient's tolerance;Monitored during session;Premedicated before session;Repositioned;Ice applied  Home Living                                          Prior Functioning/Environment              Frequency Min 2X/week     Progress Toward Goals  OT Goals(current goals can now be found in the care plan section)  Progress towards OT goals: Progressing toward goals     Plan Discharge plan remains appropriate    Co-evaluation                 End of Session     Activity Tolerance Patient tolerated treatment well   Patient Left in chair;with call bell/phone  within reach   Nurse Communication          Time: [phone] OT Time Calculation (min): 24 min  Charges: OT General Charges $OT Visit: 1 Procedure OT Treatments $Self Care/Home Management : 23-37 mins  Kaylena Pacifico 03/14/2015, 9:13 AM  Lesle Chris, OTR/L [phone] 03/14/2015

## 2015-03-14 NOTE — Progress Notes (Signed)
Physical Therapy Treatment Patient Details Name: SHAWNTRICE SALLE MRN: [id number] DOB: May 08, 1932 Today's Date: 03/14/2015    History of Present Illness 81 yo female s/p R THA-direct anterior 03/12/15. Hx of DM. Pt lives alone    PT Comments    Progressing with mobility.   Follow Up Recommendations  Home health PT;Supervision - Intermittent     Equipment Recommendations  None recommended by PT    Recommendations for Other Services       Precautions / Restrictions Precautions Precautions: Fall Restrictions Weight Bearing Restrictions: No    Mobility  Bed Mobility Overal bed mobility: Needs Assistance Bed Mobility: Supine to Sit;Sit to Supine     Supine to sit: Min guard;HOB elevated Sit to supine: Min guard;HOB elevated   General bed mobility comments: OOB in recliner  Transfers Overall transfer level: Needs assistance Equipment used: Rolling walker (2 wheeled) Transfers: Sit to/from Stand Sit to Stand: Supervision         General transfer comment: VCs hand placement  Ambulation/Gait Ambulation/Gait assistance: Supervision Ambulation Distance (Feet): 150 Feet Assistive device: Rolling walker (2 wheeled) Gait Pattern/deviations: Step-through pattern;Decreased stride length     General Gait Details: slow but steady.    Stairs            Wheelchair Mobility    Modified Rankin (Stroke Patients Only)       Balance                                    Cognition Arousal/Alertness: Awake/alert Behavior During Therapy: WFL for tasks assessed/performed Overall Cognitive Status: Within Functional Limits for tasks assessed                      Exercises     General Comments        Pertinent Vitals/Pain Pain Assessment: 0-10 Pain Score: 5  Pain Location: R thigh Pain Descriptors / Indicators: Sore Pain Intervention(s): Monitored during session;Repositioned    Home Living                      Prior Function             PT Goals (current goals can now be found in the care plan section) Progress towards PT goals: Progressing toward goals    Frequency  7X/week    PT Plan Current plan remains appropriate    Co-evaluation             End of Session   Activity Tolerance: Patient tolerated treatment well Patient left: in bed;with family/visitor present     Time: 7829-5621 PT Time Calculation (min) (ACUTE ONLY): 14 min  Charges:  $Gait Training: 8-22 mins                    G Codes:      Weston Anna, MPT Pager: [phone]

## 2015-03-14 NOTE — Progress Notes (Signed)
     Subjective: 2 Days Post-Op Procedure(s) (LRB): RIGHT TOTAL HIP ARTHROPLASTY ANTERIOR APPROACH (Right)   Patient reports pain as mild, pain controlled. No events throughout the night.  Objective:   VITALS:   Filed Vitals:   03/14/15 0532  BP: 142/94  Pulse: 78  Temp: 98.9 F (37.2 C)  Resp: 16    Dorsiflexion/Plantar flexion intact Incision: dressing C/D/I No cellulitis present Compartment soft  LABS  Recent Labs  03/13/15 0403 03/14/15 0415  HGB 9.1* 9.4*  HCT 27.2* 29.2*  WBC 5.1 4.7  PLT 113* 113*     Recent Labs  03/13/15 0403 03/14/15 0415  NA 135 135  K 4.1 4.4  BUN 15 17  CREATININE 0.67 0.63  GLUCOSE 247* 288*     Assessment/Plan: 2 Days Post-Op Procedure(s) (LRB): RIGHT TOTAL HIP ARTHROPLASTY ANTERIOR APPROACH (Right) Up with therapy Discharge home with home health when ready    West Pugh. Yaman Grauberger   PAC  03/14/2015, 8:52 AM

## 2015-03-14 NOTE — Progress Notes (Signed)
Inpatient Diabetes Program Recommendations  AACE/ADA: New Consensus Statement on Inpatient Glycemic Control (2013)  Target Ranges:  Prepandial:   less than 140 mg/dL      Peak postprandial:   less than 180 mg/dL (1-2 hours)      Critically ill patients:  140 - 180 mg/dL   CBG's high into 300's. Please consider the following: Inpatient Diabetes Program Recommendations Insulin - Basal: Please consider increae in basal insulin to 10 units in the am (and continue 5 units at HS as ordered)  Thank you Rosita Kea, RN, MSN, CDE  Diabetes Inpatient Program Office: [phone] Pager: [phone] 8:00 am to 5:00 pm

## 2015-03-15 LAB — GLUCOSE, CAPILLARY
GLUCOSE-CAPILLARY: 288 mg/dL — AB (ref 65–99)
Glucose-Capillary: 122 mg/dL — ABNORMAL HIGH (ref 65–99)

## 2015-03-15 MED ORDER — POLYETHYLENE GLYCOL 3350 17 G PO PACK: 17.0000 g | PACK | Freq: Two times a day (BID) | ORAL | Status: DC

## 2015-03-15 MED ORDER — DOCUSATE SODIUM 100 MG PO CAPS: 100.0000 mg | ORAL_CAPSULE | Freq: Two times a day (BID) | ORAL | Status: DC

## 2015-03-15 MED ORDER — FERROUS SULFATE 325 (65 FE) MG PO TABS: 325.0000 mg | ORAL_TABLET | Freq: Three times a day (TID) | ORAL | Status: DC

## 2015-03-15 NOTE — Progress Notes (Signed)
     Subjective: 3 Days Post-Op Procedure(s) (LRB): RIGHT TOTAL HIP ARTHROPLASTY ANTERIOR APPROACH (Right)   Patient reports pain as mild, pain controlled. No events throughout the night. Feels that she has all her arrangements at home, HHPT to start tomorrow. Her niece and nephew will be around to help her this weekend and has a nurse coming to her house next week to help her out.  Ready to be discharged home.  Objective:   VITALS:   Filed Vitals:   03/15/15 0435  BP: 142/61  Pulse: 72  Temp: 98.1 F (36.7 C)  Resp: 16    Dorsiflexion/Plantar flexion intact Incision: dressing C/D/I No cellulitis present Compartment soft  LABS  Recent Labs  03/13/15 0403 03/14/15 0415  HGB 9.1* 9.4*  HCT 27.2* 29.2*  WBC 5.1 4.7  PLT 113* 113*     Recent Labs  03/13/15 0403 03/14/15 0415  NA 135 135  K 4.1 4.4  BUN 15 17  CREATININE 0.67 0.63  GLUCOSE 247* 288*     Assessment/Plan: 3 Days Post-Op Procedure(s) (LRB): RIGHT TOTAL HIP ARTHROPLASTY ANTERIOR APPROACH (Right) Up with therapy Discharge home with home health  Follow up in 2 weeks at [facility]. Follow up with OLIN,Margery Szostak D in 2 weeks.  Contact information:  [facility] [address], [address] [place] Keiffer Piper   PAC  03/15/2015, 7:44 AM

## 2015-03-15 NOTE — Progress Notes (Signed)
Physical Therapy Treatment Patient Details Name: YVETTA DROTAR MRN: [id number] DOB: August 27, 1931 Today's Date: 03/15/2015    History of Present Illness 81 yo female s/p R THA-direct anterior 03/12/15. Hx of DM. Pt lives alone    PT Comments    Continuing to progress well. Pt plans to d/c home today.   Follow Up Recommendations  Home health PT;Supervision - Intermittent     Equipment Recommendations  None recommended by PT    Recommendations for Other Services       Precautions / Restrictions Precautions Precautions: Fall Restrictions Weight Bearing Restrictions: No    Mobility  Bed Mobility Overal bed mobility: Needs Assistance Bed Mobility: Supine to Sit     Supine to sit: HOB elevated;Supervision     General bed mobility comments: supervision for safety.   Transfers Overall transfer level: Needs assistance Equipment used: Rolling walker (2 wheeled) Transfers: Sit to/from Stand Sit to Stand: Supervision         General transfer comment: VCs hand placement  Ambulation/Gait Ambulation/Gait assistance: Supervision Ambulation Distance (Feet): 170 Feet Assistive device: Rolling walker (2 wheeled) Gait Pattern/deviations: Step-through pattern;Decreased stride length;Antalgic     General Gait Details: slow but steady.    Stairs            Wheelchair Mobility    Modified Rankin (Stroke Patients Only)       Balance                                    Cognition Arousal/Alertness: Awake/alert Behavior During Therapy: WFL for tasks assessed/performed Overall Cognitive Status: Within Functional Limits for tasks assessed                      Exercises Total Joint Exercises Ankle Circles/Pumps: AROM;Both;10 reps;Supine Quad Sets: AROM;Both;10 reps;Supine Heel Slides: AAROM;Right;10 reps;Supine Hip ABduction/ADduction: AAROM;Right;10 reps;Supine    General Comments        Pertinent Vitals/Pain Pain Assessment:  0-10 Pain Score: 5  Pain Location: R thigh Pain Descriptors / Indicators: Sore Pain Intervention(s): Monitored during session;Ice applied;Repositioned    Home Living                      Prior Function            PT Goals (current goals can now be found in the care plan section) Progress towards PT goals: Progressing toward goals    Frequency  7X/week    PT Plan Current plan remains appropriate    Co-evaluation             End of Session   Activity Tolerance: Patient tolerated treatment well Patient left: in chair;with call bell/phone within reach     Time: 0900-0924 PT Time Calculation (min) (ACUTE ONLY): 24 min  Charges:  $Gait Training: 8-22 mins $Therapeutic Exercise: 8-22 mins                    G Codes:      Weston Anna, MPT Pager: [phone]

## 2015-03-15 NOTE — Discharge Instructions (Signed)

## 2015-03-15 NOTE — Progress Notes (Signed)
Rn reviewed discharge instructions with patient and family. All questions answered.   Paperwork and prescriptions given.   NT rolled patient down in wheelchair with all belongings to family car.  

## 2015-03-19 DIAGNOSIS — Z471 Aftercare following joint replacement surgery: Secondary | ICD-10-CM | POA: Diagnosis not present

## 2015-03-19 DIAGNOSIS — F419 Anxiety disorder, unspecified: Secondary | ICD-10-CM | POA: Diagnosis not present

## 2015-03-19 DIAGNOSIS — I10 Essential (primary) hypertension: Secondary | ICD-10-CM | POA: Diagnosis not present

## 2015-03-19 DIAGNOSIS — E119 Type 2 diabetes mellitus without complications: Secondary | ICD-10-CM | POA: Diagnosis not present

## 2015-03-19 DIAGNOSIS — I2510 Atherosclerotic heart disease of native coronary artery without angina pectoris: Secondary | ICD-10-CM | POA: Diagnosis not present

## 2015-03-19 DIAGNOSIS — I493 Ventricular premature depolarization: Secondary | ICD-10-CM | POA: Diagnosis not present

## 2015-03-19 NOTE — Discharge Summary (Signed)
Physician Discharge Summary  Patient ID: Cynthia [name] MRN: [id number] DOB/AGE: [birth year] 81 y.o.  Admit date: 03/12/2015 Discharge date: 03/15/2015   Procedures:  Procedure(s) (LRB): RIGHT TOTAL HIP ARTHROPLASTY ANTERIOR APPROACH (Right)  Attending Physician:  Dr. Paralee Cancel   Admission Diagnoses:   Right hip primary OA / pain  Discharge Diagnoses:  Principal Problem:   S/P right THA, AA  Past Medical History  Diagnosis Date  . Dyslipidemia   . Diabetes mellitus   . Carotid bruit     LEFT  . Anxiety   . Palpitations   . PVC's (premature ventricular contractions)   . Coronary artery disease     STATUS POST PCI AND LATER CABG  . Myocardial infarction   . Hypertension   . Dysrhythmia     hx of palpitations and PVC's   . Arthritis   . History of chicken pox   . Measles     hx of   . Mumps     hx of     HPI:    Cynthia [name], 81 y.o. female, has a history of pain and functional disability in the right hip(s) due to arthritis and patient has failed non-surgical conservative treatments for greater than 12 weeks to include NSAID's and/or analgesics and activity modification. Onset of symptoms was gradual starting ~3 years ago with gradually worsening course since that time.The patient noted no past surgery on the right hip(s). Patient currently rates pain in the right hip at 10 out of 10 with activity. Patient has worsening of pain with activity and weight bearing, trendelenberg gait, pain that interfers with activities of daily living, pain with passive range of motion, crepitus and joint swelling. Patient has evidence of periarticular osteophytes and joint space narrowing by imaging studies. This condition presents safety issues increasing the risk of falls. There is no current active infection. Risks, benefits and expectations were discussed with the patient. Risks including but not limited to the risk of anesthesia, blood clots, nerve damage, blood vessel damage,  failure of the prosthesis, infection and up to and including death. Patient understand the risks, benefits and expectations and wishes to proceed with surgery.   PCP: Sheela Stack, MD   Discharged Condition: good  Hospital Course:  Patient underwent the above stated procedure on 03/12/2015. Patient tolerated the procedure well and brought to the recovery room in good condition and subsequently to the floor.  POD #1 BP: 123/60 ; Pulse: 67 ; Temp: 97.8 F (36.6 C) ; Resp: 16 Patient reports pain as mild, pain controlled. No events throughout the night. States she had a little dizziness yesterday, but things she can associate it with the IV pain medication. Dorsiflexion/plantar flexion intact, incision: dressing C/D/I, no cellulitis present and compartment soft.   LABS  Basename    HGB  9.1  HCT  27.2   POD #2  BP: 142/94 ; Pulse: 78 ; Temp: 98.9 F (37.2 C) ; Resp: 16 Patient reports pain as mild, pain controlled. No events throughout the night. Dorsiflexion/plantar flexion intact, incision: dressing C/D/I, no cellulitis present and compartment soft.   LABS  Basename    HGB  9.4  HCT  29.2   POD #3  BP: 142/61 ; Pulse: 72 ; Temp: 98.1 F (36.7 C) ; Resp: 16 Patient reports pain as mild, pain controlled. No events throughout the night. Feels that she has all her arrangements at home, HHPT to start tomorrow. Her niece and nephew will be around to help  her this weekend and has a nurse coming to her house next week to help her out. Ready to be discharged home. Dorsiflexion/plantar flexion intact, incision: dressing C/D/I, no cellulitis present and compartment soft.   LABS   No new labs  Discharge Exam: General appearance: alert, cooperative and no distress Extremities: Homans sign is negative, no sign of DVT, no edema, redness or tenderness in the calves or thighs and no ulcers, gangrene or trophic changes  Disposition: Home with follow up in 2 weeks   Follow-up  Information    Follow up with [facility].   Why:  home health physical therapy   Contact information:   [address] [place] [address] [phone]       Follow up with Mauri Pole, MD. Schedule an appointment as soon as possible for a visit in 2 weeks.   Specialty:  Orthopedic Surgery   Contact information:   [address] [place] [address] [phone]       Discharge Instructions    Call MD / Call 911    Complete by:  As directed   If you experience chest pain or shortness of breath, CALL 911 and be transported to the hospital emergency room.  If you develope a fever above 101 F, pus (white drainage) or increased drainage or redness at the wound, or calf pain, call your surgeon's office.     Change dressing    Complete by:  As directed   Maintain surgical dressing until follow up in the clinic. If the edges start to pull up, may reinforce with tape. If the dressing is no longer working, may remove and cover with gauze and tape, but must keep the area dry and clean.  Call with any questions or concerns.     Constipation Prevention    Complete by:  As directed   Drink plenty of fluids.  Prune juice may be helpful.  You may use a stool softener, such as Colace (over the counter) 100 mg twice a day.  Use MiraLax (over the counter) for constipation as needed.     Diet - low sodium heart healthy    Complete by:  As directed      Discharge instructions    Complete by:  As directed   Maintain surgical dressing until follow up in the clinic. If the edges start to pull up, may reinforce with tape. If the dressing is no longer working, may remove and cover with gauze and tape, but must keep the area dry and clean.  Follow up in 2 weeks at [facility]. Call with any questions or concerns.     Increase activity slowly as tolerated    Complete by:  As directed   Weight bearing as tolerated with assist device (walker, cane, etc) as  directed, use it as long as suggested by your surgeon or therapist, typically at least 4-6 weeks.     TED hose    Complete by:  As directed   Use stockings (TED hose) for 2 weeks on both leg(s).  You may remove them at night for sleeping.             Medication List    STOP taking these medications        aspirin 81 MG tablet  Replaced by:  aspirin 325 MG EC tablet     meloxicam 15 MG tablet  Commonly known as:  MOBIC     nitroGLYCERIN 0.4 MG  SL tablet  Commonly known as:  NITROSTAT      TAKE these medications        aspirin 325 MG EC tablet  Take 1 tablet (325 mg total) by mouth 2 (two) times daily.     atorvastatin 40 MG tablet  Commonly known as:  LIPITOR  Take 1 tablet (40 mg total) by mouth daily.     CALCIUM 500+D 500-400 MG-UNIT per tablet  Generic drug:  calcium-vitamin D  Take 2 tablets by mouth 2 (two) times daily.     Choline Fenofibrate 135 MG capsule  Commonly known as:  TRILIPIX  Take 1 capsule (135 mg total) by mouth daily.     CoQ10 200 MG Caps  Take 1 capsule by mouth every evening.     DIOVAN 80 MG tablet  Generic drug:  valsartan  Take 40 mg by mouth every morning.     docusate sodium 100 MG capsule  Commonly known as:  COLACE  Take 1 capsule (100 mg total) by mouth 2 (two) times daily.     ezetimibe 10 MG tablet  Commonly known as:  ZETIA  Take 1 tablet (10 mg total) by mouth daily.     ferrous sulfate 325 (65 FE) MG tablet  Take 1 tablet (325 mg total) by mouth 3 (three) times daily after meals.     glimepiride 2 MG tablet  Commonly known as:  AMARYL  Take 2 mg by mouth every morning.     HYDROcodone-acetaminophen 7.5-325 MG per tablet  Commonly known as:  NORCO  Take 1-2 tablets by mouth every 4 (four) hours as needed for moderate pain.     LEVEMIR 100 UNIT/ML injection  Generic drug:  insulin detemir  Inject 5 Units into the skin 2 (two) times daily.     multivitamin tablet  Take 1 tablet by mouth every morning.      polyethylene glycol packet  Commonly known as:  MIRALAX / GLYCOLAX  Take 17 g by mouth 2 (two) times daily.     sitaGLIPtin-metformin 50-1000 MG per tablet  Commonly known as:  JANUMET  Take 1 tablet by mouth 2 (two) times daily with a meal.     SYSTANE OP  Apply 1 drop to eye daily as needed (dry scratchy eyes).     tiZANidine 4 MG tablet  Commonly known as:  ZANAFLEX  Take 1 tablet (4 mg total) by mouth every 6 (six) hours as needed for muscle spasms.     vitamin C 500 MG tablet  Commonly known as:  ASCORBIC ACID  Take 500 mg by mouth every evening.         Signed: West Pugh. Georgeanne Frankland   PA-C  03/19/2015, 12:13 PM

## 2015-03-20 DIAGNOSIS — I2510 Atherosclerotic heart disease of native coronary artery without angina pectoris: Secondary | ICD-10-CM | POA: Diagnosis not present

## 2015-03-20 DIAGNOSIS — I10 Essential (primary) hypertension: Secondary | ICD-10-CM | POA: Diagnosis not present

## 2015-03-20 DIAGNOSIS — F419 Anxiety disorder, unspecified: Secondary | ICD-10-CM | POA: Diagnosis not present

## 2015-03-20 DIAGNOSIS — E119 Type 2 diabetes mellitus without complications: Secondary | ICD-10-CM | POA: Diagnosis not present

## 2015-03-20 DIAGNOSIS — Z471 Aftercare following joint replacement surgery: Secondary | ICD-10-CM | POA: Diagnosis not present

## 2015-03-20 DIAGNOSIS — I493 Ventricular premature depolarization: Secondary | ICD-10-CM | POA: Diagnosis not present

## 2015-03-22 DIAGNOSIS — E119 Type 2 diabetes mellitus without complications: Secondary | ICD-10-CM | POA: Diagnosis not present

## 2015-03-22 DIAGNOSIS — I493 Ventricular premature depolarization: Secondary | ICD-10-CM | POA: Diagnosis not present

## 2015-03-22 DIAGNOSIS — Z471 Aftercare following joint replacement surgery: Secondary | ICD-10-CM | POA: Diagnosis not present

## 2015-03-22 DIAGNOSIS — F419 Anxiety disorder, unspecified: Secondary | ICD-10-CM | POA: Diagnosis not present

## 2015-03-22 DIAGNOSIS — I2510 Atherosclerotic heart disease of native coronary artery without angina pectoris: Secondary | ICD-10-CM | POA: Diagnosis not present

## 2015-03-22 DIAGNOSIS — I10 Essential (primary) hypertension: Secondary | ICD-10-CM | POA: Diagnosis not present

## 2015-03-25 DIAGNOSIS — I10 Essential (primary) hypertension: Secondary | ICD-10-CM | POA: Diagnosis not present

## 2015-03-25 DIAGNOSIS — I2510 Atherosclerotic heart disease of native coronary artery without angina pectoris: Secondary | ICD-10-CM | POA: Diagnosis not present

## 2015-03-25 DIAGNOSIS — I493 Ventricular premature depolarization: Secondary | ICD-10-CM | POA: Diagnosis not present

## 2015-03-25 DIAGNOSIS — F419 Anxiety disorder, unspecified: Secondary | ICD-10-CM | POA: Diagnosis not present

## 2015-03-25 DIAGNOSIS — E119 Type 2 diabetes mellitus without complications: Secondary | ICD-10-CM | POA: Diagnosis not present

## 2015-03-25 DIAGNOSIS — Z471 Aftercare following joint replacement surgery: Secondary | ICD-10-CM | POA: Diagnosis not present

## 2015-03-27 DIAGNOSIS — E119 Type 2 diabetes mellitus without complications: Secondary | ICD-10-CM | POA: Diagnosis not present

## 2015-03-27 DIAGNOSIS — I10 Essential (primary) hypertension: Secondary | ICD-10-CM | POA: Diagnosis not present

## 2015-03-27 DIAGNOSIS — I2510 Atherosclerotic heart disease of native coronary artery without angina pectoris: Secondary | ICD-10-CM | POA: Diagnosis not present

## 2015-03-27 DIAGNOSIS — Z471 Aftercare following joint replacement surgery: Secondary | ICD-10-CM | POA: Diagnosis not present

## 2015-03-27 DIAGNOSIS — F419 Anxiety disorder, unspecified: Secondary | ICD-10-CM | POA: Diagnosis not present

## 2015-03-27 DIAGNOSIS — I493 Ventricular premature depolarization: Secondary | ICD-10-CM | POA: Diagnosis not present

## 2015-03-28 DIAGNOSIS — Z471 Aftercare following joint replacement surgery: Secondary | ICD-10-CM | POA: Diagnosis not present

## 2015-03-28 DIAGNOSIS — Z96641 Presence of right artificial hip joint: Secondary | ICD-10-CM | POA: Diagnosis not present

## 2015-03-29 DIAGNOSIS — I493 Ventricular premature depolarization: Secondary | ICD-10-CM | POA: Diagnosis not present

## 2015-03-29 DIAGNOSIS — Z471 Aftercare following joint replacement surgery: Secondary | ICD-10-CM | POA: Diagnosis not present

## 2015-03-29 DIAGNOSIS — I2510 Atherosclerotic heart disease of native coronary artery without angina pectoris: Secondary | ICD-10-CM | POA: Diagnosis not present

## 2015-03-29 DIAGNOSIS — F419 Anxiety disorder, unspecified: Secondary | ICD-10-CM | POA: Diagnosis not present

## 2015-03-29 DIAGNOSIS — E119 Type 2 diabetes mellitus without complications: Secondary | ICD-10-CM | POA: Diagnosis not present

## 2015-03-29 DIAGNOSIS — I10 Essential (primary) hypertension: Secondary | ICD-10-CM | POA: Diagnosis not present

## 2015-04-10 ENCOUNTER — Encounter (HOSPITAL_COMMUNITY): Payer: Self-pay | Admitting: Emergency Medicine

## 2015-04-10 ENCOUNTER — Emergency Department (HOSPITAL_COMMUNITY)
Admission: EM | Admit: 2015-04-10 | Discharge: 2015-04-10 | Disposition: A | Discharge status: Home / Self Care | Payer: Medicare Other | Attending: Emergency Medicine | Admitting: Emergency Medicine

## 2015-04-10 ENCOUNTER — Emergency Department (HOSPITAL_COMMUNITY): Payer: Medicare Other

## 2015-04-10 DIAGNOSIS — M25522 Pain in left elbow: Secondary | ICD-10-CM | POA: Diagnosis not present

## 2015-04-10 DIAGNOSIS — I2510 Atherosclerotic heart disease of native coronary artery without angina pectoris: Secondary | ICD-10-CM | POA: Diagnosis not present

## 2015-04-10 DIAGNOSIS — Y9301 Activity, walking, marching and hiking: Secondary | ICD-10-CM | POA: Diagnosis not present

## 2015-04-10 DIAGNOSIS — Y998 Other external cause status: Secondary | ICD-10-CM | POA: Diagnosis not present

## 2015-04-10 DIAGNOSIS — Z79899 Other long term (current) drug therapy: Secondary | ICD-10-CM | POA: Insufficient documentation

## 2015-04-10 DIAGNOSIS — M1990 Unspecified osteoarthritis, unspecified site: Secondary | ICD-10-CM | POA: Insufficient documentation

## 2015-04-10 DIAGNOSIS — I252 Old myocardial infarction: Secondary | ICD-10-CM | POA: Diagnosis not present

## 2015-04-10 DIAGNOSIS — W231XXA Caught, crushed, jammed, or pinched between stationary objects, initial encounter: Secondary | ICD-10-CM | POA: Diagnosis not present

## 2015-04-10 DIAGNOSIS — Z9889 Other specified postprocedural states: Secondary | ICD-10-CM | POA: Insufficient documentation

## 2015-04-10 DIAGNOSIS — E785 Hyperlipidemia, unspecified: Secondary | ICD-10-CM | POA: Insufficient documentation

## 2015-04-10 DIAGNOSIS — Z8619 Personal history of other infectious and parasitic diseases: Secondary | ICD-10-CM | POA: Diagnosis not present

## 2015-04-10 DIAGNOSIS — S59912A Unspecified injury of left forearm, initial encounter: Secondary | ICD-10-CM | POA: Diagnosis present

## 2015-04-10 DIAGNOSIS — Y9289 Other specified places as the place of occurrence of the external cause: Secondary | ICD-10-CM | POA: Diagnosis not present

## 2015-04-10 DIAGNOSIS — Z951 Presence of aortocoronary bypass graft: Secondary | ICD-10-CM | POA: Diagnosis not present

## 2015-04-10 DIAGNOSIS — M25532 Pain in left wrist: Secondary | ICD-10-CM | POA: Diagnosis not present

## 2015-04-10 DIAGNOSIS — E119 Type 2 diabetes mellitus without complications: Secondary | ICD-10-CM | POA: Insufficient documentation

## 2015-04-10 DIAGNOSIS — Z8659 Personal history of other mental and behavioral disorders: Secondary | ICD-10-CM | POA: Diagnosis not present

## 2015-04-10 DIAGNOSIS — S6992XA Unspecified injury of left wrist, hand and finger(s), initial encounter: Secondary | ICD-10-CM | POA: Insufficient documentation

## 2015-04-10 DIAGNOSIS — S52122A Displaced fracture of head of left radius, initial encounter for closed fracture: Secondary | ICD-10-CM | POA: Insufficient documentation

## 2015-04-10 DIAGNOSIS — S52102A Unspecified fracture of upper end of left radius, initial encounter for closed fracture: Secondary | ICD-10-CM | POA: Diagnosis not present

## 2015-04-10 DIAGNOSIS — I10 Essential (primary) hypertension: Secondary | ICD-10-CM | POA: Insufficient documentation

## 2015-04-10 DIAGNOSIS — S59902A Unspecified injury of left elbow, initial encounter: Secondary | ICD-10-CM | POA: Diagnosis not present

## 2015-04-10 NOTE — Discharge Instructions (Signed)
Elbow Fracture, Simple A fracture is a break in one of the bones.When fractures are not displaced or separated, they may be treated with only a sling or splint. The sling or splint may only be required for two to three weeks. In these cases, often the elbow is put through early range of motion exercises to prevent the elbow from getting stiff. DIAGNOSIS  The diagnosis (learning what is wrong) of a fractured elbow is made by x-ray. These may be required before and after the elbow is put into a splint or cast. X-rays are taken after to make sure the bone pieces have not moved. HOME CARE INSTRUCTIONS   Only take over-the-counter or prescription medicines for pain, discomfort, or fever as directed by your caregiver.  If you have a splint held on with an elastic wrap and your hand or fingers become numb or cold and blue, loosen the wrap and reapply more loosely. See your caregiver if there is no relief.  You may use ice for twenty minutes, four times per day, for the first two to three days.  Use your elbow as directed.  See your caregiver as directed. It is very important to keep all follow-up referrals and appointments in order to avoid any long-term problems with your elbow including chronic pain or stiffness. SEEK IMMEDIATE MEDICAL CARE IF:   There is swelling or increasing pain in elbow.  You begin to lose feeling or experience numbness or tingling in your hand or fingers.  You develop swelling of the hand and fingers.  You get a cold or blue hand or fingers on affected side. MAKE SURE YOU:   Understand these instructions.  Will watch your condition.  Will get help right away if you are not doing well or get worse. Document Released: 07/21/2001 Document Revised: 10/19/2011 Document Reviewed: 06/11/2009 Exit[employer] Patient Information 2015 [employer]. This information is not intended to replace advice given to you by your health care provider. Make sure you discuss any questions you  have with your health care provider.  

## 2015-04-10 NOTE — Progress Notes (Signed)
Per discussion with rn, patient refusing placement and requesting to return home with home health services. CSW consulted with rn cm.   Cynthia [name], [employer]  [employer] [phone]

## 2015-04-10 NOTE — ED Notes (Signed)
Pt a&ox 4 and ambulatory with walker. Questions, concerns denied r/t dc

## 2015-04-10 NOTE — ED Provider Notes (Signed)
CSN: [id number]     Arrival date & time 04/10/15  1041 History   First MD Initiated Contact with Patient 04/10/15 1131     Chief Complaint  Patient presents with  . Arm Pain     (Consider location/radiation/quality/duration/timing/severity/associated sxs/prior Treatment) HPI   Cynthia [name] 81 y.o.female  PCP: Sheela Stack, MD  Orthopedist, Dr. Alvan Dame  Blood pressure 156/68, pulse 86, temperature 98.1 F (36.7 C), temperature source Oral, resp. rate 16, SpO2 98 %.  SIGNIFICANT DPO:EUMPNTIR, anxiety, recent hip arthroplasty 03/12/2015 - Dr. Alvan Dame CHIEF COMPLAINT: left arm pain  When: Yesterday afternoon How: the patient was walking and gravel in her foot got caught in the rocks. She is using a walker due to a right hip replacement that was done a few weeks ago so her legs are weak. she fell to the ground and caught herself with most of the weight landing on that left arm and left knee. She did not reinjure her right hip, did not injure her right knee, denies loss of consciousness, head pain or neck pain.  Chronic: acute Location: left arm Radiation: none Quality and severity: sharp and pulling Alleviating factors: rest Worsening factors: movement or trying to use arm Treatments tried: ice Associated Symptoms: pain Negative ROS: Confusion, diaphoresis, fever, headache, weakness (general or focal), change of vision,  neck pain, dysphagia, aphagia, chest pain, shortness of breath,  back pain, abdominal pains, nausea, vomiting, diarrhea, lower extremity swelling, rash.Donny Pique   Past Medical History  Diagnosis Date  . Dyslipidemia   . Diabetes mellitus   . Carotid bruit     LEFT  . Anxiety   . Palpitations   . PVC's (premature ventricular contractions)   . Coronary artery disease     STATUS POST PCI AND LATER CABG  . Myocardial infarction   . Hypertension   . Dysrhythmia     hx of palpitations and PVC's   . Arthritis   . History of chicken pox   . Measles     hx  of   . Mumps     hx of    Past Surgical History  Procedure Laterality Date  . Cardiac catheterization  06/06/97    IT REVEALA MILD TO MODERATE LEFT VENTRICULAR SYSTOLIC DYSFUNCTION  WITH EF OF 40%. THERE IS PERSISTENT AKINESIS OF THE INFERIOR WALL. THERE IS MILD REGURGITATION PRESENT. THERE IS MILD HYPOKINESIS OF THE ANTERIOR  AND APEX WALL  . Coronary artery bypass graft    . Tonsillectomy    . Total hip arthroplasty Right 03/12/2015    Procedure: RIGHT TOTAL HIP ARTHROPLASTY ANTERIOR APPROACH;  Surgeon: Paralee Cancel, MD;  Location: [facility];  Service: Orthopedics;  Laterality: Right;   Family History  Problem Relation Age of Onset  . Heart attack Mother   . Stroke Father   . Cancer Father    Social History  Substance Use Topics  . Smoking status: Never Smoker   . Smokeless tobacco: Never Used  . Alcohol Use: No   OB History    No data available     Review of Systems    Allergies  Review of patient's allergies indicates no known allergies.  Home Medications   Prior to Admission medications   Medication Sig Start Date End Date Taking? Authorizing Provider  atorvastatin (LIPITOR) 40 MG tablet Take 1 tablet (40 mg total) by mouth daily. Patient taking differently: Take 40 mg by mouth every evening.  10/01/14  Yes Thayer Headings, MD  B-D INS  SYR ULTRAFINE 1CC/31G 31G X 5/16" 1 ML MISC  02/23/15  Yes Historical Provider, MD  calcium-vitamin D (CALCIUM 500+D) 500-400 MG-UNIT per tablet Take 2 tablets by mouth 2 (two) times daily.    Yes Historical Provider, MD  Choline Fenofibrate (TRILIPIX) 135 MG capsule Take 1 capsule (135 mg total) by mouth daily. Patient taking differently: Take 135 mg by mouth every evening.  01/11/15  Yes Thayer Headings, MD  Coenzyme Q10 (COQ10) 200 MG CAPS Take 1 capsule by mouth every evening.    Yes Historical Provider, MD  ezetimibe (ZETIA) 10 MG tablet Take 1 tablet (10 mg total) by mouth daily. Patient taking differently: Take 10 mg by mouth every  evening.  01/11/15  Yes Thayer Headings, MD  glimepiride (AMARYL) 2 MG tablet Take 2 mg by mouth every morning.  12/20/12  Yes Historical Provider, MD  LEVEMIR 100 UNIT/ML injection Inject 5 Units into the skin 2 (two) times daily.  11/15/13  Yes Historical Provider, MD  Multiple Vitamin (MULTIVITAMIN) tablet Take 1 tablet by mouth every morning.    Yes Historical Provider, MD  ONE TOUCH ULTRA TEST test strip 2 (two) times daily. test blood sugar 03/08/15  Yes Historical Provider, MD  Glory Rosebush DELICA LANCETS 08M MISC CHECK BLOOD SUGARS 2 TIMES A DAY 03/27/15  Yes Historical Provider, MD  Polyethyl Glycol-Propyl Glycol (SYSTANE OP) Apply 1 drop to eye daily as needed (dry scratchy eyes).   Yes Historical Provider, MD  sitaGLIPtan-metformin (JANUMET) 50-1000 MG per tablet Take 1 tablet by mouth 2 (two) times daily with a meal. 02/03/13  Yes Thayer Headings, MD  valsartan (DIOVAN) 80 MG tablet Take 40 mg by mouth every morning.  02/03/13  Yes Thayer Headings, MD  vitamin C (ASCORBIC ACID) 500 MG tablet Take 500 mg by mouth every evening.    Yes Historical Provider, MD  docusate sodium (COLACE) 100 MG capsule Take 1 capsule (100 mg total) by mouth 2 (two) times daily. Patient not taking: Reported on 04/10/2015 03/15/15   Danae Orleans, PA-C  ferrous sulfate 325 (65 FE) MG tablet Take 1 tablet (325 mg total) by mouth 3 (three) times daily after meals. Patient not taking: Reported on 04/10/2015 03/15/15   Danae Orleans, PA-C  HYDROcodone-acetaminophen (NORCO) 7.5-325 MG per tablet Take 1-2 tablets by mouth every 4 (four) hours as needed for moderate pain. Patient not taking: Reported on 04/10/2015 03/14/15   Danae Orleans, PA-C  polyethylene glycol (MIRALAX / Floria Raveling) packet Take 17 g by mouth 2 (two) times daily. Patient not taking: Reported on 04/10/2015 03/15/15   Danae Orleans, PA-C  tiZANidine (ZANAFLEX) 4 MG tablet Take 1 tablet (4 mg total) by mouth every 6 (six) hours as needed for muscle spasms. Patient not  taking: Reported on 04/10/2015 03/14/15   Danae Orleans, PA-C   BP 138/66 mmHg  Pulse 60  Temp(Src) 98.7 F (37.1 C) (Oral)  Resp 16  SpO2 98% Physical Exam  Constitutional: She appears well-developed and well-nourished. No distress.  HENT:  Head: Normocephalic and atraumatic.  Eyes: Pupils are equal, round, and reactive to light.  Neck: Normal range of motion. Neck supple.  Cardiovascular: Normal rate and regular rhythm.   Pulmonary/Chest: Effort normal.  Abdominal: Soft.  Musculoskeletal:       Left wrist: She exhibits decreased range of motion, tenderness, bony tenderness and swelling. She exhibits no effusion, no crepitus, no deformity and no laceration.       Left forearm: She exhibits tenderness, bony  tenderness and swelling. She exhibits no edema, no deformity and no laceration.       Left hand: Normal.  Neurological: She is alert.  Skin: Skin is warm and dry.  Nursing note and vitals reviewed.   ED Course  Procedures (including critical care time) Labs Review Labs Reviewed - No data to display  Imaging Review No results found. I have personally reviewed and evaluated these images and lab results as part of my medical decision-making.   EKG Interpretation None      MDM   Final diagnoses:  Radial head fracture, closed, left, initial encounter   Xray shows: Proximal left rmildly comminuted and impacted left radial head neck fracture with hemarthrosis  Will place in a long arm splint. Dr. Zenia Resides has seen the patient as well - has had Case Manager consult for home health due to difficulty ambulating because of recent surgery and now broken arm making using a walker differently. The patient prefers home health over a rehab facility but has been given that option as well. I spoke with dr. Grandville Silos on-call Hand who is comfortable with plan however patient has not decided if she wants to see our on- call or try to get in with Dr. Amedeo Plenty.   No blood thinners. Otherwise  her wrist (left) xray is unremarkable.  Medications - No data to display  81 y.o.Cynthia [name]'s evaluation in the Emergency Department is complete. It has been determined that no acute conditions requiring further emergency intervention are present at this time. The patient/guardian have been advised of the diagnosis and plan. We have discussed signs and symptoms that warrant return to the ED, such as changes or worsening in symptoms.  Vital signs are stable at discharge. Filed Vitals:   04/10/15 1520  BP: 138/66  Pulse: 60  Temp:   Resp: 16    Patient/guardian has voiced understanding and agreed to follow-up with the PCP or specialist.     Delos Haring, PA-C 04/17/15 1704  Lacretia Leigh, MD 04/19/15 [phone]

## 2015-04-10 NOTE — Progress Notes (Addendum)
81 yr old female with hip surgery recently [place] closed her case recently.  Pt since has had fall with left arm injury Placed in cast in [facility] ED Pt has in ED her rolling walker Pt right side dominant Pt ambulatory and has her niece and friends as good support system who help with groceries, appts, etc  Pt choice of home health agency is [facility] in [place] . Cm encouraged HHPT for home safety check evaluation and treatment and DME recommendations and [place] aide for assist with ADLS Pt agreed CM updhome ated ED PA CM reviewed in details medicare guidelines, health ([facility]) (length of stay in home, types of [facility] staff available, coverage, primary caregiver, up to 24 hrs before services may be started) and Private duty nursing ([facility]-coverage, length of stay in the home types of staff available). CM provided pt/family with a list of [place] home health agencies, PDN and [employer] on aging resources.  Cm contacted [place] staff, Tim for referral for home services

## 2015-04-10 NOTE — ED Provider Notes (Signed)
Medical screening examination/treatment/procedure(s) were conducted as a shared visit with non-physician practitioner(s) and myself.  I personally evaluated the patient during the encounter.   EKG Interpretation None     Patient here after mechanical fall injuring her left elbow. Fracture noted and splint applied by orthopedic tech. Follow-up arranged  Lacretia Leigh, MD 04/10/15 [phone]

## 2015-04-10 NOTE — ED Notes (Signed)
Per pt, lost footing yesterday and fell on left arm-pain from elbow to wrist-swelling and bruising

## 2015-04-11 DIAGNOSIS — S52122D Displaced fracture of head of left radius, subsequent encounter for closed fracture with routine healing: Secondary | ICD-10-CM | POA: Diagnosis not present

## 2015-04-11 DIAGNOSIS — Z96641 Presence of right artificial hip joint: Secondary | ICD-10-CM | POA: Diagnosis not present

## 2015-04-11 DIAGNOSIS — Z471 Aftercare following joint replacement surgery: Secondary | ICD-10-CM | POA: Diagnosis not present

## 2015-04-11 DIAGNOSIS — Z794 Long term (current) use of insulin: Secondary | ICD-10-CM | POA: Diagnosis not present

## 2015-04-12 DIAGNOSIS — S52122D Displaced fracture of head of left radius, subsequent encounter for closed fracture with routine healing: Secondary | ICD-10-CM | POA: Diagnosis not present

## 2015-04-16 DIAGNOSIS — Z794 Long term (current) use of insulin: Secondary | ICD-10-CM | POA: Diagnosis not present

## 2015-04-16 DIAGNOSIS — Z96641 Presence of right artificial hip joint: Secondary | ICD-10-CM | POA: Diagnosis not present

## 2015-04-16 DIAGNOSIS — Z471 Aftercare following joint replacement surgery: Secondary | ICD-10-CM | POA: Diagnosis not present

## 2015-04-16 DIAGNOSIS — S52122D Displaced fracture of head of left radius, subsequent encounter for closed fracture with routine healing: Secondary | ICD-10-CM | POA: Diagnosis not present

## 2015-04-19 DIAGNOSIS — Z794 Long term (current) use of insulin: Secondary | ICD-10-CM | POA: Diagnosis not present

## 2015-04-19 DIAGNOSIS — Z471 Aftercare following joint replacement surgery: Secondary | ICD-10-CM | POA: Diagnosis not present

## 2015-04-19 DIAGNOSIS — S52122D Displaced fracture of head of left radius, subsequent encounter for closed fracture with routine healing: Secondary | ICD-10-CM | POA: Diagnosis not present

## 2015-04-19 DIAGNOSIS — Z96641 Presence of right artificial hip joint: Secondary | ICD-10-CM | POA: Diagnosis not present

## 2015-04-23 DIAGNOSIS — S52122D Displaced fracture of head of left radius, subsequent encounter for closed fracture with routine healing: Secondary | ICD-10-CM | POA: Diagnosis not present

## 2015-04-23 DIAGNOSIS — Z794 Long term (current) use of insulin: Secondary | ICD-10-CM | POA: Diagnosis not present

## 2015-04-23 DIAGNOSIS — Z471 Aftercare following joint replacement surgery: Secondary | ICD-10-CM | POA: Diagnosis not present

## 2015-04-23 DIAGNOSIS — Z96641 Presence of right artificial hip joint: Secondary | ICD-10-CM | POA: Diagnosis not present

## 2015-04-25 DIAGNOSIS — S52122D Displaced fracture of head of left radius, subsequent encounter for closed fracture with routine healing: Secondary | ICD-10-CM | POA: Diagnosis not present

## 2015-04-25 DIAGNOSIS — Z96641 Presence of right artificial hip joint: Secondary | ICD-10-CM | POA: Diagnosis not present

## 2015-04-25 DIAGNOSIS — Z794 Long term (current) use of insulin: Secondary | ICD-10-CM | POA: Diagnosis not present

## 2015-04-25 DIAGNOSIS — Z471 Aftercare following joint replacement surgery: Secondary | ICD-10-CM | POA: Diagnosis not present

## 2015-04-26 DIAGNOSIS — Z96641 Presence of right artificial hip joint: Secondary | ICD-10-CM | POA: Diagnosis not present

## 2015-04-26 DIAGNOSIS — Z471 Aftercare following joint replacement surgery: Secondary | ICD-10-CM | POA: Diagnosis not present

## 2015-04-26 DIAGNOSIS — S52125D Nondisplaced fracture of head of left radius, subsequent encounter for closed fracture with routine healing: Secondary | ICD-10-CM | POA: Diagnosis not present

## 2015-05-06 DIAGNOSIS — M25621 Stiffness of right elbow, not elsewhere classified: Secondary | ICD-10-CM | POA: Diagnosis not present

## 2015-05-13 DIAGNOSIS — Z23 Encounter for immunization: Secondary | ICD-10-CM | POA: Diagnosis not present

## 2015-05-13 DIAGNOSIS — M25621 Stiffness of right elbow, not elsewhere classified: Secondary | ICD-10-CM | POA: Diagnosis not present

## 2015-05-20 DIAGNOSIS — M25621 Stiffness of right elbow, not elsewhere classified: Secondary | ICD-10-CM | POA: Diagnosis not present

## 2015-05-24 DIAGNOSIS — S52125D Nondisplaced fracture of head of left radius, subsequent encounter for closed fracture with routine healing: Secondary | ICD-10-CM | POA: Diagnosis not present

## 2015-05-24 DIAGNOSIS — Z471 Aftercare following joint replacement surgery: Secondary | ICD-10-CM | POA: Diagnosis not present

## 2015-05-24 DIAGNOSIS — Z96641 Presence of right artificial hip joint: Secondary | ICD-10-CM | POA: Diagnosis not present

## 2015-05-24 DIAGNOSIS — M1611 Unilateral primary osteoarthritis, right hip: Secondary | ICD-10-CM | POA: Diagnosis not present

## 2015-05-27 DIAGNOSIS — M25621 Stiffness of right elbow, not elsewhere classified: Secondary | ICD-10-CM | POA: Diagnosis not present

## 2015-06-03 DIAGNOSIS — M25422 Effusion, left elbow: Secondary | ICD-10-CM | POA: Diagnosis not present

## 2015-06-03 DIAGNOSIS — M25522 Pain in left elbow: Secondary | ICD-10-CM | POA: Diagnosis not present

## 2015-06-13 DIAGNOSIS — Z1231 Encounter for screening mammogram for malignant neoplasm of breast: Secondary | ICD-10-CM | POA: Diagnosis not present

## 2015-06-17 DIAGNOSIS — M25522 Pain in left elbow: Secondary | ICD-10-CM | POA: Diagnosis not present

## 2015-06-17 DIAGNOSIS — M25422 Effusion, left elbow: Secondary | ICD-10-CM | POA: Diagnosis not present

## 2015-07-02 ENCOUNTER — Other Ambulatory Visit (INDEPENDENT_AMBULATORY_CARE_PROVIDER_SITE_OTHER): Payer: Medicare Other | Admitting: *Deleted

## 2015-07-02 DIAGNOSIS — I25119 Atherosclerotic heart disease of native coronary artery with unspecified angina pectoris: Secondary | ICD-10-CM

## 2015-07-02 DIAGNOSIS — E784 Other hyperlipidemia: Secondary | ICD-10-CM | POA: Diagnosis not present

## 2015-07-02 DIAGNOSIS — E559 Vitamin D deficiency, unspecified: Secondary | ICD-10-CM | POA: Diagnosis not present

## 2015-07-02 DIAGNOSIS — D500 Iron deficiency anemia secondary to blood loss (chronic): Secondary | ICD-10-CM | POA: Diagnosis not present

## 2015-07-02 DIAGNOSIS — Z6820 Body mass index (BMI) 20.0-20.9, adult: Secondary | ICD-10-CM | POA: Diagnosis not present

## 2015-07-02 DIAGNOSIS — D696 Thrombocytopenia, unspecified: Secondary | ICD-10-CM | POA: Diagnosis not present

## 2015-07-02 DIAGNOSIS — I209 Angina pectoris, unspecified: Secondary | ICD-10-CM | POA: Diagnosis not present

## 2015-07-02 DIAGNOSIS — E1140 Type 2 diabetes mellitus with diabetic neuropathy, unspecified: Secondary | ICD-10-CM | POA: Diagnosis not present

## 2015-07-02 DIAGNOSIS — I2510 Atherosclerotic heart disease of native coronary artery without angina pectoris: Secondary | ICD-10-CM | POA: Diagnosis not present

## 2015-07-02 DIAGNOSIS — E1142 Type 2 diabetes mellitus with diabetic polyneuropathy: Secondary | ICD-10-CM | POA: Diagnosis not present

## 2015-07-02 DIAGNOSIS — E871 Hypo-osmolality and hyponatremia: Secondary | ICD-10-CM | POA: Diagnosis not present

## 2015-07-02 DIAGNOSIS — I10 Essential (primary) hypertension: Secondary | ICD-10-CM

## 2015-07-02 DIAGNOSIS — R740 Nonspecific elevation of levels of transaminase and lactic acid dehydrogenase [LDH]: Secondary | ICD-10-CM | POA: Diagnosis not present

## 2015-07-02 DIAGNOSIS — E042 Nontoxic multinodular goiter: Secondary | ICD-10-CM | POA: Diagnosis not present

## 2015-07-02 LAB — BASIC METABOLIC PANEL
BUN: 11 mg/dL (ref 7–25)
CO2: 26 mmol/L (ref 20–31)
Calcium: 9.2 mg/dL (ref 8.6–10.4)
Chloride: 100 mmol/L (ref 98–110)
Creat: 0.57 mg/dL — ABNORMAL LOW (ref 0.60–0.88)
Glucose, Bld: 123 mg/dL — ABNORMAL HIGH (ref 65–99)
POTASSIUM: 4.1 mmol/L (ref 3.5–5.3)
SODIUM: 134 mmol/L — AB (ref 135–146)

## 2015-07-02 LAB — LIPID PANEL
CHOL/HDL RATIO: 2.6 ratio (ref <=5.0)
CHOLESTEROL: 84 mg/dL — AB (ref 125–200)
HDL: 32 mg/dL — AB (ref >=46)
LDL Cholesterol: 44 mg/dL (ref <130)
TRIGLYCERIDES: 39 mg/dL (ref <150)
VLDL: 8 mg/dL (ref <30)

## 2015-07-02 LAB — HEPATIC FUNCTION PANEL
ALT: 16 U/L (ref 6–29)
AST: 25 U/L (ref 10–35)
Albumin: 4 g/dL (ref 3.6–5.1)
Alkaline Phosphatase: 58 U/L (ref 33–130)
Bilirubin, Direct: 0.2 mg/dL (ref <=0.2)
Indirect Bilirubin: 0.4 mg/dL (ref 0.2–1.2)
TOTAL PROTEIN: 6.2 g/dL (ref 6.1–8.1)
Total Bilirubin: 0.6 mg/dL (ref 0.2–1.2)

## 2015-07-02 NOTE — Addendum Note (Signed)
Addended by: Eulis Foster on: 07/02/2015 09:05 AM   Modules accepted: Orders

## 2015-07-02 NOTE — Addendum Note (Signed)
Addended by: Eulis Foster on: 07/02/2015 08:32 AM   Modules accepted: Orders

## 2015-07-12 ENCOUNTER — Ambulatory Visit (INDEPENDENT_AMBULATORY_CARE_PROVIDER_SITE_OTHER): Payer: Medicare Other | Admitting: Cardiovascular Disease

## 2015-07-12 ENCOUNTER — Encounter: Payer: Self-pay | Admitting: Cardiovascular Disease

## 2015-07-12 VITALS — BP 118/70 | HR 72 | Ht 62.0 in | Wt 113.0 lb

## 2015-07-12 DIAGNOSIS — E785 Hyperlipidemia, unspecified: Secondary | ICD-10-CM

## 2015-07-12 DIAGNOSIS — Z471 Aftercare following joint replacement surgery: Secondary | ICD-10-CM | POA: Diagnosis not present

## 2015-07-12 DIAGNOSIS — I25119 Atherosclerotic heart disease of native coronary artery with unspecified angina pectoris: Secondary | ICD-10-CM | POA: Diagnosis not present

## 2015-07-12 DIAGNOSIS — I2510 Atherosclerotic heart disease of native coronary artery without angina pectoris: Secondary | ICD-10-CM

## 2015-07-12 DIAGNOSIS — S52125D Nondisplaced fracture of head of left radius, subsequent encounter for closed fracture with routine healing: Secondary | ICD-10-CM | POA: Diagnosis not present

## 2015-07-12 DIAGNOSIS — Z96641 Presence of right artificial hip joint: Secondary | ICD-10-CM | POA: Diagnosis not present

## 2015-07-12 DIAGNOSIS — M25622 Stiffness of left elbow, not elsewhere classified: Secondary | ICD-10-CM | POA: Diagnosis not present

## 2015-07-12 NOTE — Patient Instructions (Signed)

## 2015-07-12 NOTE — Progress Notes (Signed)
Cardiology Office Note   Date:  07/12/2015   ID:  CAMRIE ROTTMANN, DOB July 17, 1932, MRN [id number]  PCP:  Sheela Stack, MD  Cardiologist:   Thayer Headings, MD   Chief Complaint  Patient presents with  . Follow-up   1. Coronary artery disease-status post PCI( April , 1998) and CABG ( Nov. 1998) 2. Dyslipidemia 3. Diabetes mellitus 4. Left carotid bruit 5. Right hip arthritis   History of Present Illness: Cynthia [name] is a 81 y.o. y.o. female with a history of coronary artery disease. She status post PCI and then later had coronary artery bypass grafting. She also has a history of dyslipidemia, diabetes mellitus, and a left carotid bruit.  She has done well from a cardiac standpoint. She has not been exercising as much as she has in the past. She complains of generalized fatigue. She stays busy doing chores. 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  January 16, 2013:  Cynthia [name] is dong well. She is getting over several viral infections over the past few months. No CP.   Dec. 22,2014:  Cynthia [name] has had a rough summer. Also has had a cold. Her husband has been at [facility] since early Sept. No cardiac issues.   January 08, 2014:  Cynthia [name] is doing ok. She has lost lots of weight over the past year or so and now is gaining some weight back. Her has an abdominal bruit. Renal artery duplex study was normal.   She has started insulin therapy.   Dec. 1, 2015:  Cynthia [name] is an 81 yo who I follow for CAD, dyslipidema.  She has had some BP variablility.  No CP,  Has not been exercising. Has hip / back problems.  Her husband passed away this past 2023-01-16.    January 11, 2015:  Cynthia [name] is a 81 y.o. female who presents for  Follow up of her CAD and HTN. Having lots of pain this am - as a result , her BP is high  May need to have hip surgery    Dec. 2, 2016:  Doing well.  No cardiac issues. Had her R hip replacement.  [place] and broke her left arm  while using her walker.   Bp and HR are well controlled.    Past Medical History  Diagnosis Date  . Dyslipidemia   . Diabetes mellitus   . Carotid bruit     LEFT  . Anxiety   . Palpitations   . PVC's (premature ventricular contractions)   . Coronary artery disease     STATUS POST PCI AND LATER CABG  . Myocardial infarction ([place])   . Hypertension   . Dysrhythmia     hx of palpitations and PVC's   . Arthritis   . History of chicken pox   . Measles     hx of   . Mumps     hx of     Past Surgical History  Procedure Laterality Date  . Cardiac catheterization  06/06/97    IT REVEALA MILD TO MODERATE LEFT VENTRICULAR SYSTOLIC DYSFUNCTION  WITH EF OF 40%. THERE IS PERSISTENT AKINESIS OF THE INFERIOR WALL. THERE IS MILD REGURGITATION PRESENT. THERE IS MILD HYPOKINESIS OF THE ANTERIOR  AND APEX WALL  . Coronary artery bypass graft    . Tonsillectomy    . Total hip arthroplasty Right 03/12/2015    Procedure: RIGHT TOTAL HIP ARTHROPLASTY ANTERIOR APPROACH;  Surgeon: Paralee Cancel, MD;  Location:  [facility];  Service: Orthopedics;  Laterality: Right;     Current Outpatient Prescriptions  Medication Sig Dispense Refill  . atorvastatin (LIPITOR) 40 MG tablet Take 1 tablet (40 mg total) by mouth daily. (Patient taking differently: Take 40 mg by mouth every evening. ) 90 tablet 3  . B-D INS SYR ULTRAFINE 1CC/31G 31G X 5/16" 1 ML MISC     . calcium-vitamin D (CALCIUM 500+D) 500-400 MG-UNIT per tablet Take 2 tablets by mouth 2 (two) times daily.     . Choline Fenofibrate (TRILIPIX) 135 MG capsule Take 1 capsule (135 mg total) by mouth daily. (Patient taking differently: Take 135 mg by mouth every evening. ) 90 capsule 3  . Coenzyme Q10 (COQ10) 200 MG CAPS Take 1 capsule by mouth every evening.     . ezetimibe (ZETIA) 10 MG tablet Take 1 tablet (10 mg total) by mouth daily. (Patient taking differently: Take 10 mg by mouth every evening. ) 90 tablet 3  . ferrous sulfate 325 (65 FE) MG tablet Take 1  tablet (325 mg total) by mouth 3 (three) times daily after meals.  3  . glimepiride (AMARYL) 2 MG tablet Take 2 mg by mouth every morning.     [name] LEVEMIR 100 UNIT/ML injection Inject 5 Units into the skin 2 (two) times daily.     . Multiple Vitamin (MULTIVITAMIN) tablet Take 1 tablet by mouth every morning.     . ONE TOUCH ULTRA TEST test strip 2 (two) times daily. test blood sugar  5  . ONETOUCH DELICA LANCETS 99991111 MISC CHECK BLOOD SUGARS 2 TIMES A DAY  5  . Polyethyl Glycol-Propyl Glycol (SYSTANE OP) Apply 1 drop to eye daily as needed (dry scratchy eyes).    . polyethylene glycol (MIRALAX / GLYCOLAX) packet Take 17 g by mouth 2 (two) times daily. 14 each 0  . sitaGLIPtan-metformin (JANUMET) 50-1000 MG per tablet Take 1 tablet by mouth 2 (two) times daily with a meal.    . vitamin C (ASCORBIC ACID) 500 MG tablet Take 500 mg by mouth every evening.     . docusate sodium (COLACE) 100 MG capsule Take 1 capsule (100 mg total) by mouth 2 (two) times daily. (Patient not taking: Reported on 04/10/2015) 10 capsule 0  . valsartan (DIOVAN) 80 MG tablet Take 80 mg by mouth as needed.  6   No current facility-administered medications for this visit.    Allergies:   Review of patient's allergies indicates no known allergies.    Social History:  The patient  reports that she has never smoked. She has never used smokeless tobacco. She reports that she does not drink alcohol or use illicit drugs.   Family History:  The patient's family history includes Cancer in her father; Heart attack in her mother; Stroke in her father.    ROS:  Please see the history of present illness.    Review of Systems: Constitutional:  denies fever, chills, diaphoresis, appetite change and fatigue.  HEENT: denies photophobia, eye pain, redness, hearing loss, ear pain, congestion, sore throat, rhinorrhea, sneezing, neck pain, neck stiffness and tinnitus.  Respiratory: denies SOB, DOE, cough, chest tightness, and wheezing.    Cardiovascular: denies chest pain, palpitations and leg swelling.  Gastrointestinal: denies nausea, vomiting, abdominal pain, diarrhea, constipation, blood in stool.  Genitourinary: denies dysuria, urgency, frequency, hematuria, flank pain and difficulty urinating.  Musculoskeletal: denies  myalgias, back pain, joint swelling, arthralgias and gait problem.   Skin: denies pallor, rash and wound.  Neurological: denies dizziness, seizures, syncope, weakness, light-headedness, numbness and headaches.   Hematological: denies adenopathy, easy bruising, personal or family bleeding history.  Psychiatric/ Behavioral: denies suicidal ideation, mood changes, confusion, nervousness, sleep disturbance and agitation.       All other systems are reviewed and negative.    PHYSICAL EXAM: VS:  BP 118/70 mmHg  Pulse 72  Ht 5\' 2"  (1.575 m)  Wt 113 lb (51.256 kg)  BMI 20.66 kg/m2 , BMI Body mass index is 20.66 kg/(m^2). GEN: Well nourished, well developed, in no acute distress HEENT: normal Neck: no JVD, carotid bruits, or masses Cardiac: RRR; no murmurs, rubs, or gallops,no edema  Respiratory:  clear to auscultation bilaterally, normal work of breathing GI: soft, nontender, nondistended, + BS MS: no deformity or atrophy Skin: warm and dry, no rash Neuro:  Strength and sensation are intact Psych: normal   EKG:  EKG is ordered today. The ekg ordered today demonstrates : NSR  , inc. RBBB , old inferior MI . Old ant. MI    Recent Labs: 03/14/2015: Hemoglobin 9.4*; Platelets 113* 07/02/2015: ALT 16; BUN 11; Creat 0.57*; Potassium 4.1; Sodium 134*    Lipid Panel    Component Value Date/Time   CHOL 84* 07/02/2015 0906   TRIG 39 07/02/2015 0906   HDL 32* 07/02/2015 0906   CHOLHDL 2.6 07/02/2015 0906   VLDL 8 07/02/2015 0906   LDLCALC 44 07/02/2015 0906      Wt Readings from Last 3 Encounters:  07/12/15 113 lb (51.256 kg)  03/12/15 116 lb (52.617 kg)  03/06/15 116 lb (52.617 kg)       Other studies Reviewed: Additional studies/ records that were reviewed today include: . Review of the above records demonstrates:    ASSESSMENT AND PLAN:  1. Coronary artery disease-status post PCI( April , 1998) and CABG ( Nov. 1998) - no angina  No dyspnea 2. Dyslipidemia -    Her insurance currently has informed her that they will not be covering fenofibrate capsules 135 mg. Alternatives include generic TriCor 145 mg, generic Lofibra  160 mg I would prefer that she take TriCor 145 mg. Either of these alternatives would be fine.    3. Diabetes mellitus 4. Left carotid bruit 5. Right hip arthritis -  She may need surgery on her right  Hip.  She is at low risk for cardiac complications with her hip surgery.    Current medicines are reviewed at length with the patient today.  The patient does not have concerns regarding medicines.  The following changes have been made:  no change  Labs/ tests ordered today include:  No orders of the defined types were placed in this encounter.     Disposition:   FU with me in 6 months    Nahser, Wonda Cheng, MD  07/12/2015 2:00 PM    [facility] [address], [place], [place]  [address] Phone: [phone]; Fax: [phone]   [facility]  [address] [place], [place]  [address] [phone]    Fax [phone]

## 2015-08-21 ENCOUNTER — Telehealth: Payer: Self-pay | Admitting: Cardiovascular Disease

## 2015-08-21 MED ORDER — FENOFIBRATE 145 MG PO TABS: 145.0000 mg | ORAL_TABLET | Freq: Every day | ORAL | Status: DC

## 2015-08-21 NOTE — Telephone Encounter (Signed)
New message      Pt c/o medication issue:  1. Name of Medication: fenobibrac  2. How are you currently taking this medication (dosage and times per day)? 135mg  3. Are you having a reaction (difficulty breathing--STAT)? no 4. What is your medication issue? Ins will not longer pay for this medication.  If ok with doctor, need fenofibrate 145mg  called in to [facility].  Ins will cover this.  Please call pt and let her know what Dr Acie Fredrickson says

## 2015-08-21 NOTE — Telephone Encounter (Signed)
Follow up        *STAT* If patient is at the pharmacy, call can be transferred to refill team.   1. Which medications need to be refilled? (please list name of each medication and dose if known)  Glimepiride 2mg  2. Which pharmacy/location (including street and city if local pharmacy) is medication to be sent to? [facility]  3. Do they need a 30 day or 90 day supply? 90 day supply

## 2015-08-21 NOTE — Telephone Encounter (Signed)
Fenofibrate 145 mg Rx has been sent.  Patient needs to contact PCP for Glimepiride Rx.

## 2015-09-15 ENCOUNTER — Other Ambulatory Visit: Payer: Self-pay | Admitting: Cardiovascular Disease

## 2015-10-28 DIAGNOSIS — E1140 Type 2 diabetes mellitus with diabetic neuropathy, unspecified: Secondary | ICD-10-CM | POA: Diagnosis not present

## 2015-10-28 DIAGNOSIS — E784 Other hyperlipidemia: Secondary | ICD-10-CM | POA: Diagnosis not present

## 2015-10-28 DIAGNOSIS — L814 Other melanin hyperpigmentation: Secondary | ICD-10-CM | POA: Diagnosis not present

## 2015-10-28 DIAGNOSIS — D2272 Melanocytic nevi of left lower limb, including hip: Secondary | ICD-10-CM | POA: Diagnosis not present

## 2015-10-28 DIAGNOSIS — D500 Iron deficiency anemia secondary to blood loss (chronic): Secondary | ICD-10-CM | POA: Diagnosis not present

## 2015-10-28 DIAGNOSIS — E1142 Type 2 diabetes mellitus with diabetic polyneuropathy: Secondary | ICD-10-CM | POA: Diagnosis not present

## 2015-10-28 DIAGNOSIS — L905 Scar conditions and fibrosis of skin: Secondary | ICD-10-CM | POA: Diagnosis not present

## 2015-10-28 DIAGNOSIS — L738 Other specified follicular disorders: Secondary | ICD-10-CM | POA: Diagnosis not present

## 2015-10-28 DIAGNOSIS — D225 Melanocytic nevi of trunk: Secondary | ICD-10-CM | POA: Diagnosis not present

## 2015-10-28 DIAGNOSIS — D696 Thrombocytopenia, unspecified: Secondary | ICD-10-CM | POA: Diagnosis not present

## 2015-10-28 DIAGNOSIS — I2510 Atherosclerotic heart disease of native coronary artery without angina pectoris: Secondary | ICD-10-CM | POA: Diagnosis not present

## 2015-10-28 DIAGNOSIS — E559 Vitamin D deficiency, unspecified: Secondary | ICD-10-CM | POA: Diagnosis not present

## 2015-10-28 DIAGNOSIS — L821 Other seborrheic keratosis: Secondary | ICD-10-CM | POA: Diagnosis not present

## 2015-10-28 DIAGNOSIS — Z6820 Body mass index (BMI) 20.0-20.9, adult: Secondary | ICD-10-CM | POA: Diagnosis not present

## 2015-10-28 DIAGNOSIS — D2271 Melanocytic nevi of right lower limb, including hip: Secondary | ICD-10-CM | POA: Diagnosis not present

## 2015-10-28 DIAGNOSIS — Z1389 Encounter for screening for other disorder: Secondary | ICD-10-CM | POA: Diagnosis not present

## 2015-10-28 DIAGNOSIS — E871 Hypo-osmolality and hyponatremia: Secondary | ICD-10-CM | POA: Diagnosis not present

## 2015-11-12 DIAGNOSIS — Z6820 Body mass index (BMI) 20.0-20.9, adult: Secondary | ICD-10-CM | POA: Diagnosis not present

## 2015-11-12 DIAGNOSIS — E1140 Type 2 diabetes mellitus with diabetic neuropathy, unspecified: Secondary | ICD-10-CM | POA: Diagnosis not present

## 2015-11-12 DIAGNOSIS — I10 Essential (primary) hypertension: Secondary | ICD-10-CM | POA: Diagnosis not present

## 2015-11-14 DIAGNOSIS — H25013 Cortical age-related cataract, bilateral: Secondary | ICD-10-CM | POA: Diagnosis not present

## 2015-11-14 DIAGNOSIS — Z0100 Encounter for examination of eyes and vision without abnormal findings: Secondary | ICD-10-CM | POA: Diagnosis not present

## 2015-11-14 DIAGNOSIS — H2513 Age-related nuclear cataract, bilateral: Secondary | ICD-10-CM | POA: Diagnosis not present

## 2015-12-12 DIAGNOSIS — J302 Other seasonal allergic rhinitis: Secondary | ICD-10-CM | POA: Diagnosis not present

## 2015-12-12 DIAGNOSIS — I10 Essential (primary) hypertension: Secondary | ICD-10-CM | POA: Diagnosis not present

## 2015-12-12 DIAGNOSIS — E1140 Type 2 diabetes mellitus with diabetic neuropathy, unspecified: Secondary | ICD-10-CM | POA: Diagnosis not present

## 2015-12-12 DIAGNOSIS — Z6820 Body mass index (BMI) 20.0-20.9, adult: Secondary | ICD-10-CM | POA: Diagnosis not present

## 2015-12-13 IMAGING — DX DG ELBOW COMPLETE 3+V*L*
4 series · 4 of 4 positions shown · non-contrast
Comparison: None.

CLINICAL DATA: 83-year-old female who fell yesterday. Pain. Initial
encounter.

EXAM:
LEFT ELBOW - COMPLETE 3+ VIEW

[elbow ap]
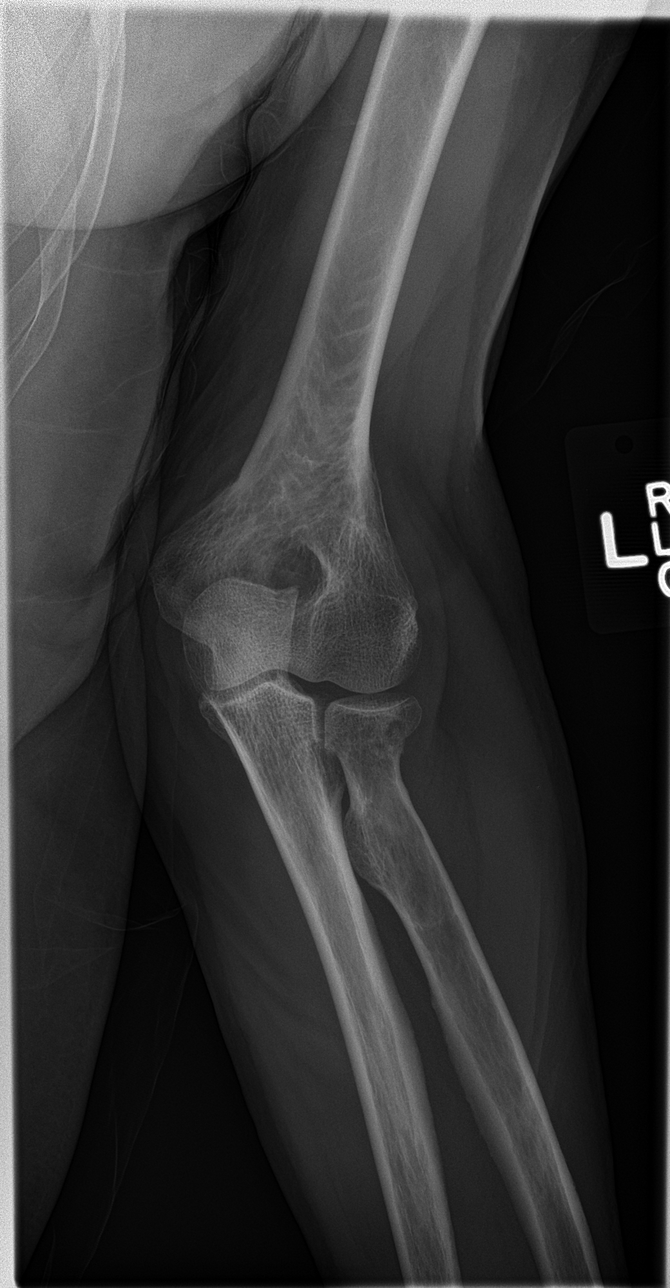

[elbow obl (1 of 2)]
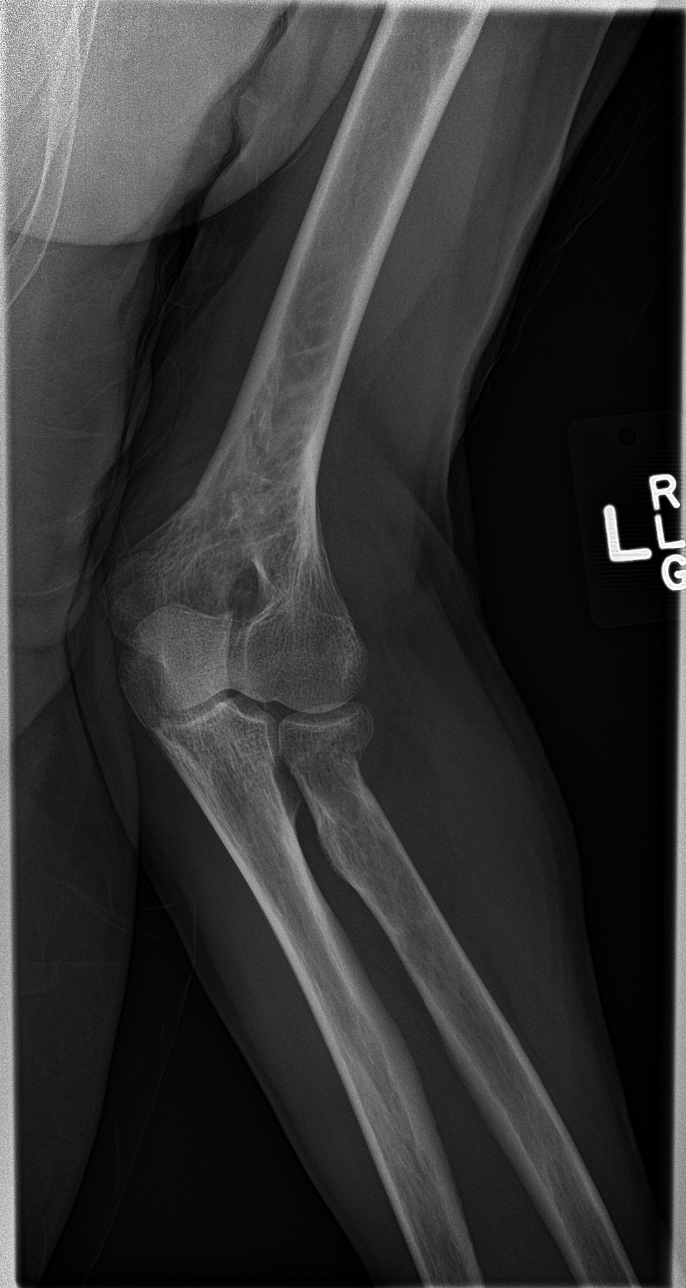

[elbow obl (2 of 2)]
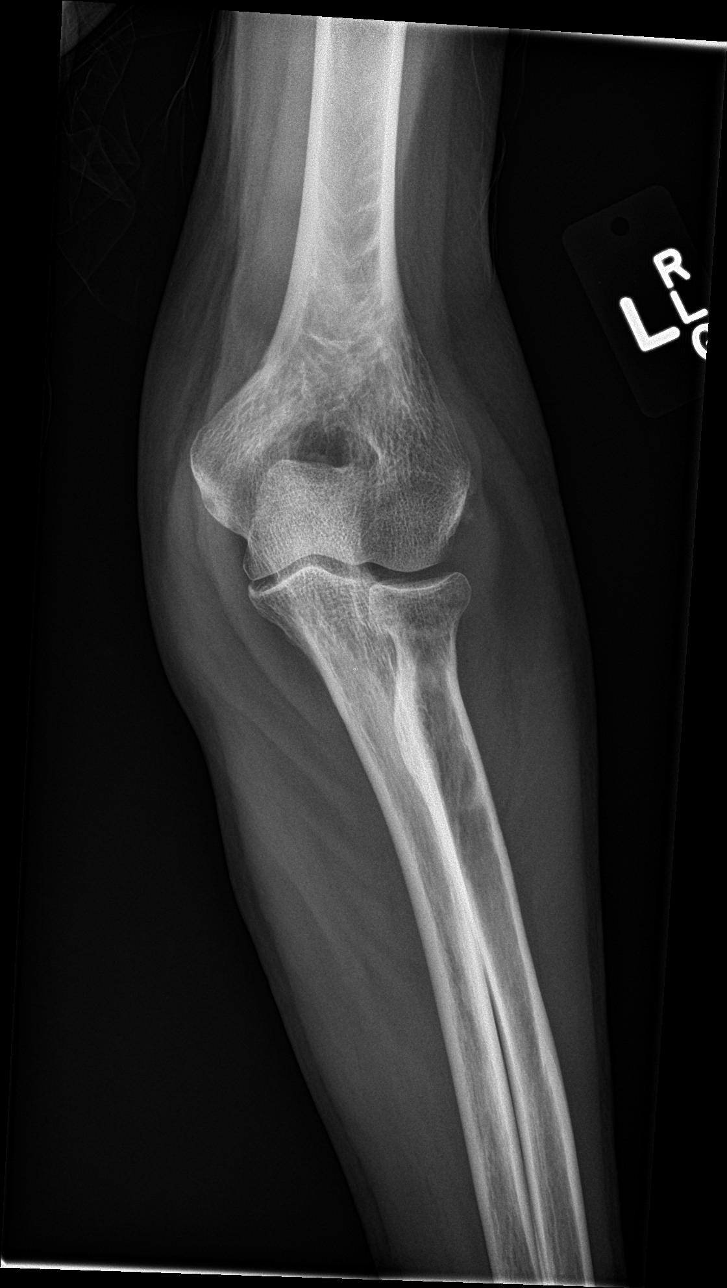

[elbow lat]
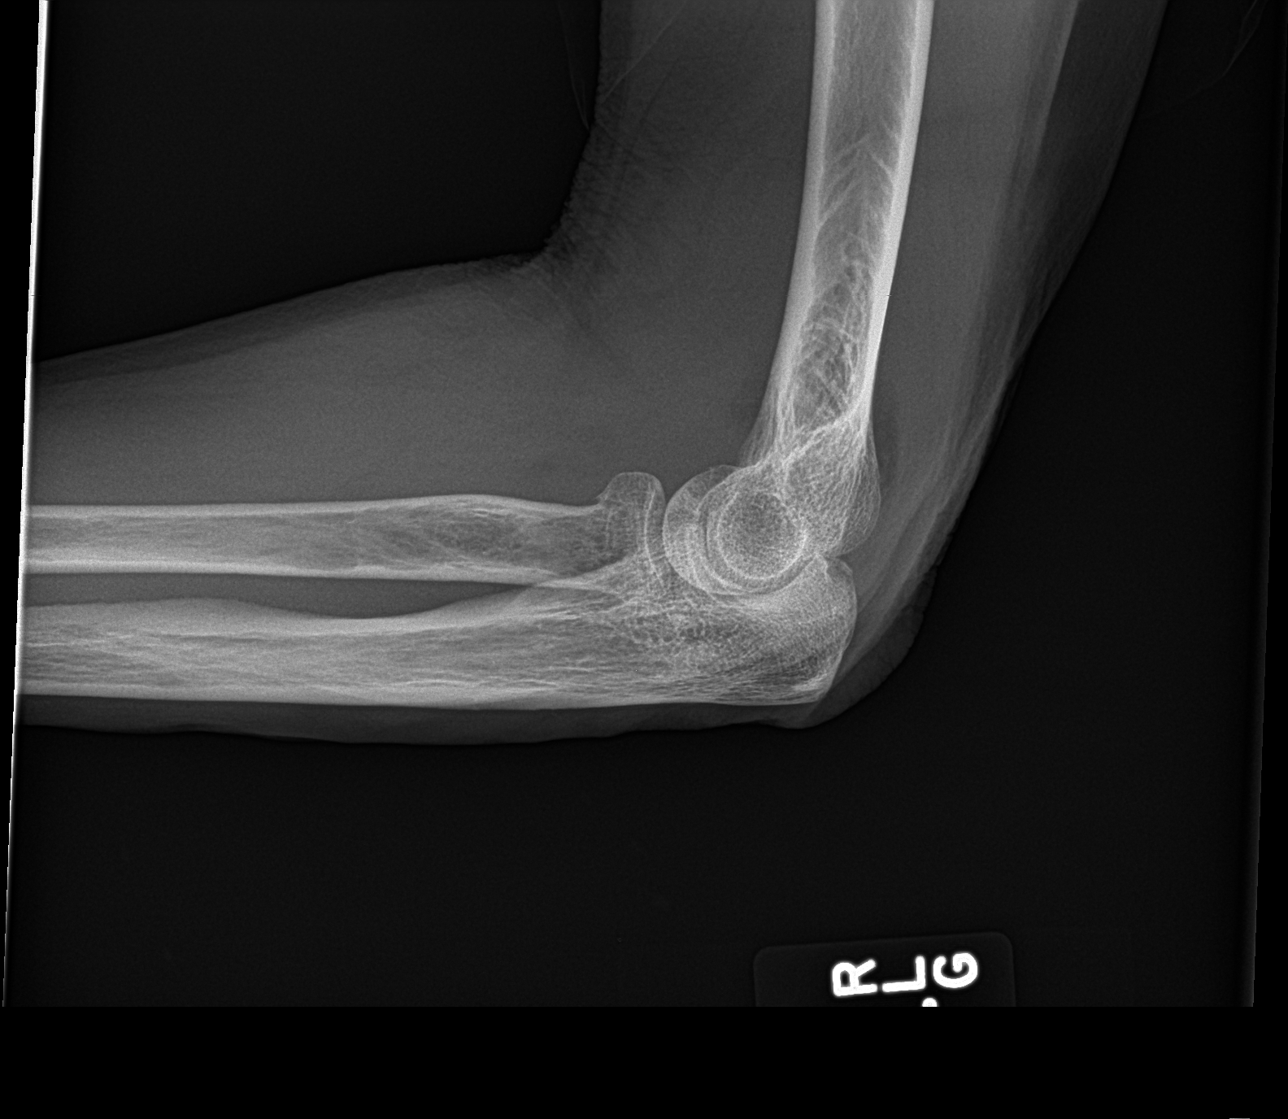

[4 of 4 positions shown; findings below may reference images not displayed]

FINDINGS: Positive joint effusion and comminuted, mildly impacted radial head/
neck fracture (lateral view). Joint spaces and alignment preserved.
Distal humerus appears intact. No proximal ulna fracture.
IMPRESSION: Mildly comminuted and impacted left radial head/ neck fracture with
hemarthrosis.

## 2015-12-13 IMAGING — DX DG WRIST COMPLETE 3+V*L*
3 series · 3 of 3 positions shown · non-contrast
Comparison: Left forearm and elbow films from today reported
separately.

CLINICAL DATA: 83-year-old female who fell yesterday. Pain. Initial
encounter.

EXAM:
LEFT WRIST - COMPLETE 3+ VIEW

[wrist pa]
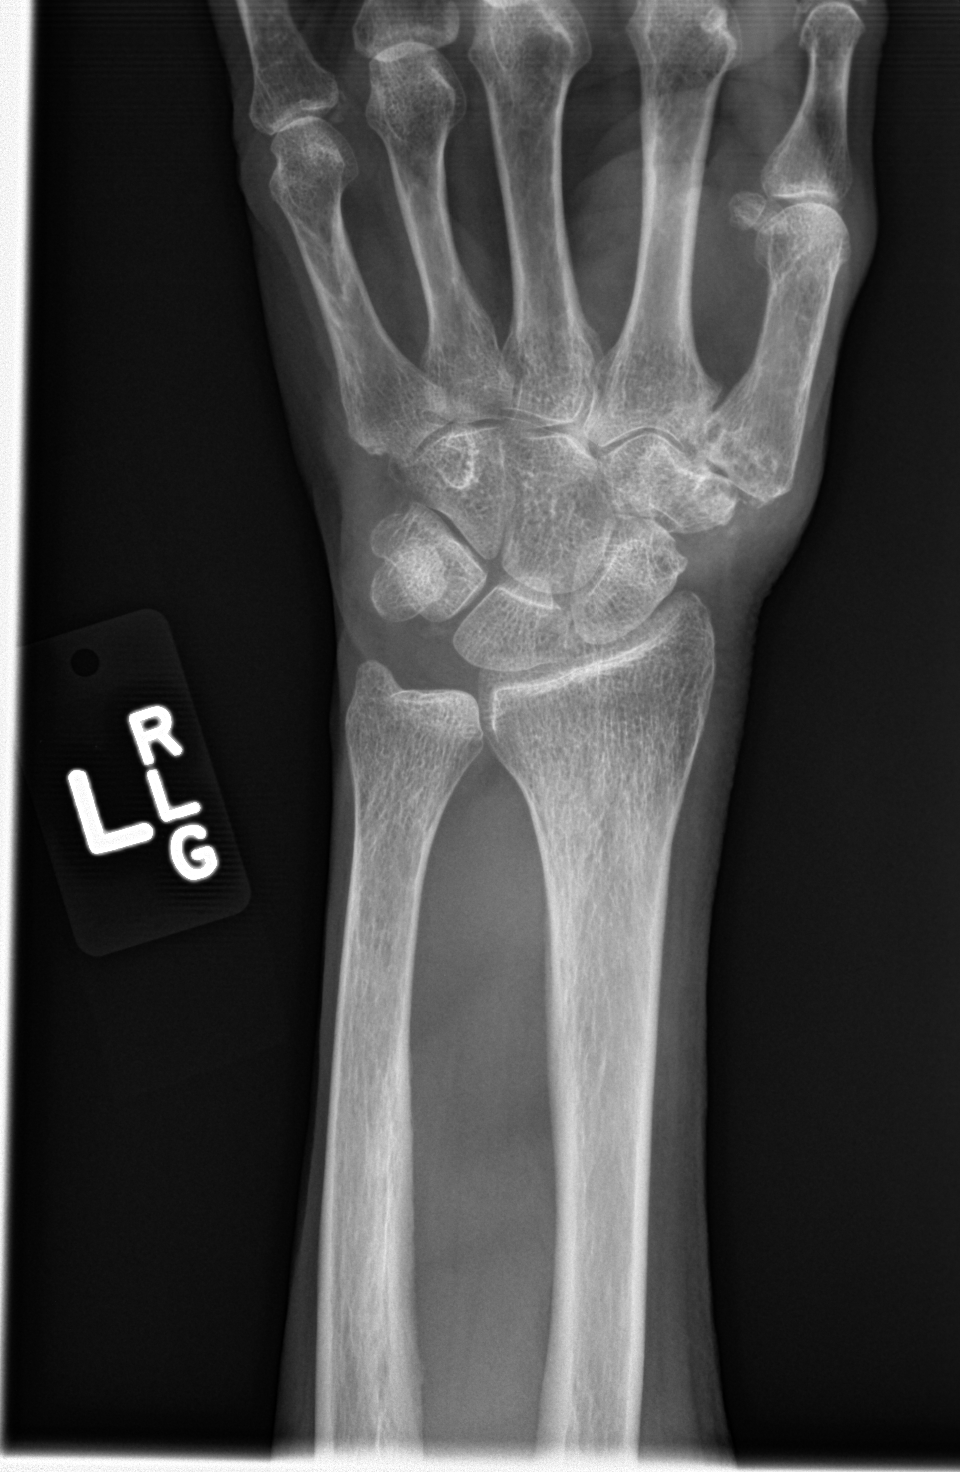

[wrist obl]
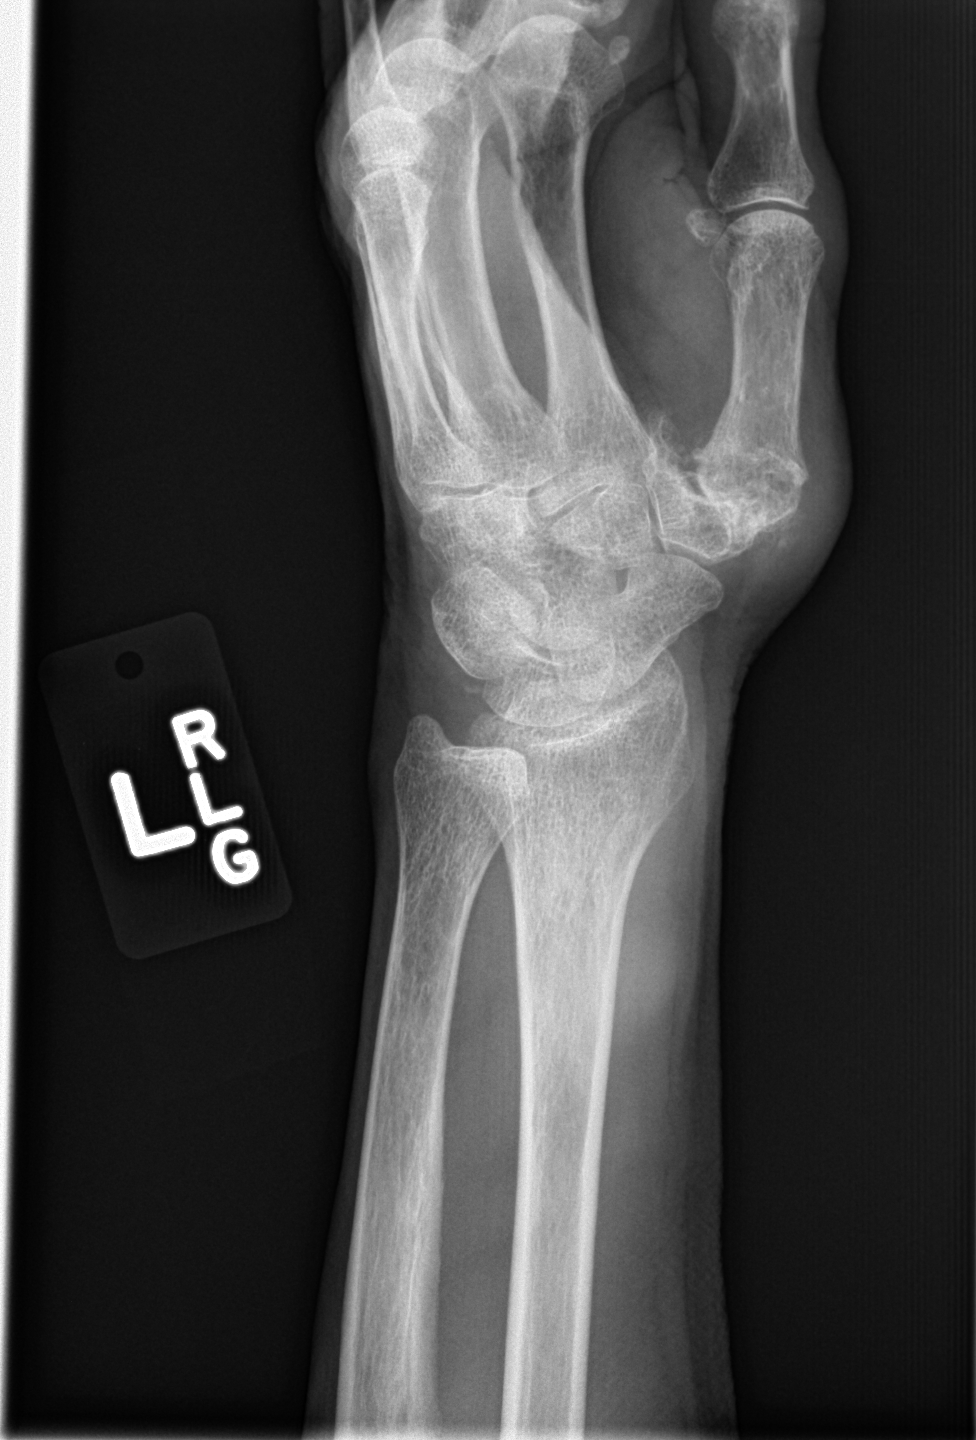

[wrist lat]
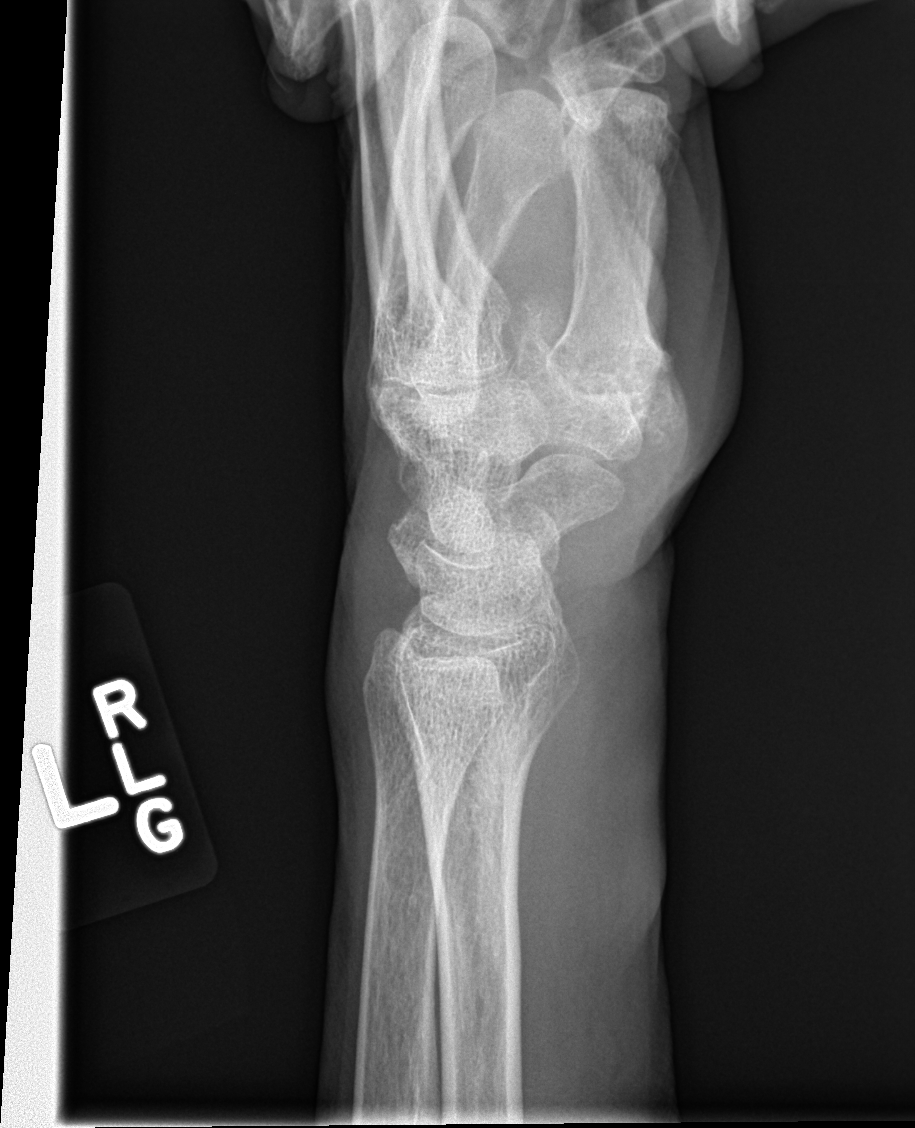

[3 of 3 positions shown; findings below may reference images not displayed]

FINDINGS: Distal left radius and ulna intact. Advanced degenerative changes at
the basal joint of the left thumb including subchondral sclerosis,
periarticular erosions, and periosteal reaction. Carpal bone
alignment is within normal limits. No acute fracture identified.
IMPRESSION: No acute fracture or dislocation identified about the left wrist.
Extensive degeneration probably secondary to erosive osteoarthritis
at the basal joint of the left thumb.

## 2015-12-29 ENCOUNTER — Other Ambulatory Visit: Payer: Self-pay | Admitting: Cardiovascular Disease

## 2016-01-09 ENCOUNTER — Encounter: Payer: Self-pay | Admitting: Cardiovascular Disease

## 2016-01-09 ENCOUNTER — Ambulatory Visit (INDEPENDENT_AMBULATORY_CARE_PROVIDER_SITE_OTHER): Payer: Medicare Other | Admitting: Cardiovascular Disease

## 2016-01-09 ENCOUNTER — Other Ambulatory Visit (INDEPENDENT_AMBULATORY_CARE_PROVIDER_SITE_OTHER): Payer: Medicare Other | Admitting: *Deleted

## 2016-01-09 VITALS — BP 124/50 | HR 60 | Ht 62.0 in | Wt 114.6 lb

## 2016-01-09 DIAGNOSIS — I10 Essential (primary) hypertension: Secondary | ICD-10-CM | POA: Diagnosis not present

## 2016-01-09 DIAGNOSIS — I2510 Atherosclerotic heart disease of native coronary artery without angina pectoris: Secondary | ICD-10-CM

## 2016-01-09 DIAGNOSIS — E785 Hyperlipidemia, unspecified: Secondary | ICD-10-CM

## 2016-01-09 LAB — LIPID PANEL
CHOLESTEROL: 101 mg/dL — AB (ref 125–200)
HDL: 40 mg/dL — AB (ref >=46)
LDL CALC: 53 mg/dL (ref <130)
TRIGLYCERIDES: 42 mg/dL (ref <150)
Total CHOL/HDL Ratio: 2.5 Ratio (ref <=5.0)
VLDL: 8 mg/dL (ref <30)

## 2016-01-09 LAB — COMPREHENSIVE METABOLIC PANEL
ALK PHOS: 48 U/L (ref 33–130)
ALT: 18 U/L (ref 6–29)
AST: 29 U/L (ref 10–35)
Albumin: 4.3 g/dL (ref 3.6–5.1)
BUN: 12 mg/dL (ref 7–25)
CALCIUM: 9.7 mg/dL (ref 8.6–10.4)
CHLORIDE: 98 mmol/L (ref 98–110)
CO2: 25 mmol/L (ref 20–31)
Creat: 0.63 mg/dL (ref 0.60–0.88)
GLUCOSE: 121 mg/dL — AB (ref 65–99)
POTASSIUM: 4.1 mmol/L (ref 3.5–5.3)
Sodium: 132 mmol/L — ABNORMAL LOW (ref 135–146)
Total Bilirubin: 0.6 mg/dL (ref 0.2–1.2)
Total Protein: 6.3 g/dL (ref 6.1–8.1)

## 2016-01-09 NOTE — Progress Notes (Signed)
Cardiology Office Note   Date:  13-Jan-2016   ID:  Cynthia [name], DOB 1931-10-19, MRN [id number]  PCP:  Sheela Stack, MD  Cardiologist:   Mertie Moores, MD   Chief Complaint  Patient presents with  . Coronary Artery Disease   1. Coronary artery disease-status post PCI( April , 1998) and CABG ( Nov. 1998) 2. Dyslipidemia 3. Diabetes mellitus 4. Left carotid bruit 5. Right hip arthritis   History of Present Illness: Cynthia [name] is a 82 y.o. y.o. female with a history of coronary artery disease. She status post PCI and then later had coronary artery bypass grafting. She also has a history of dyslipidemia, diabetes mellitus, and a left carotid bruit.  She has done well from a cardiac standpoint. She has not been exercising as much as she has in the past. She complains of generalized fatigue. She stays busy doing chores. 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  January 16, 2013:  Cynthia [name] is dong well. She is getting over several viral infections over the past few months. No CP.   Dec. 22,2014:  Cynthia [name] has had a rough summer. Also has had a cold. Her husband has been at [facility] since early Sept. No cardiac issues.   January 12, 2014:  Cynthia [name] is doing ok. She has lost lots of weight over the past year or so and now is gaining some weight back. Her has an abdominal bruit. Renal artery duplex study was normal.   She has started insulin therapy.   Dec. 1, 2015:  Cynthia [name] is an 82 yo who I follow for CAD, dyslipidema.  She has had some BP variablility.  No CP,  Has not been exercising. Has hip / back problems.  Her husband passed away this past 2023/01/13.    January 11, 2015:  Cynthia [name] is a 82 y.o. female who presents for  Follow up of her CAD and HTN. Having lots of pain this am - as a result , her BP is high  May need to have hip surgery    Dec. 2, 2016:  Doing well.  No cardiac issues. Had her R hip replacement.  [address] and broke her  left arm while using her walker.   Bp and HR are well controlled.    01-13-16: Doing well.  No CP or dyspnea. Does not have the endurance that she used to have   Past Medical History  Diagnosis Date  . Dyslipidemia   . Diabetes mellitus   . Carotid bruit     LEFT  . Anxiety   . Palpitations   . PVC's (premature ventricular contractions)   . Coronary artery disease     STATUS POST PCI AND LATER CABG  . Myocardial infarction ([place])   . Hypertension   . Dysrhythmia     hx of palpitations and PVC's   . Arthritis   . History of chicken pox   . Measles     hx of   . Mumps     hx of     Past Surgical History  Procedure Laterality Date  . Cardiac catheterization  06/06/97    IT REVEALA MILD TO MODERATE LEFT VENTRICULAR SYSTOLIC DYSFUNCTION  WITH EF OF 40%. THERE IS PERSISTENT AKINESIS OF THE INFERIOR WALL. THERE IS MILD REGURGITATION PRESENT. THERE IS MILD HYPOKINESIS OF THE ANTERIOR  AND APEX WALL  . Coronary artery bypass graft    . Tonsillectomy    .  Total hip arthroplasty Right 03/12/2015    Procedure: RIGHT TOTAL HIP ARTHROPLASTY ANTERIOR APPROACH;  Surgeon: Paralee Cancel, MD;  Location: [facility];  Service: Orthopedics;  Laterality: Right;     Current Outpatient Prescriptions  Medication Sig Dispense Refill  . atorvastatin (LIPITOR) 40 MG tablet Take 1 tablet (40 mg total) by mouth daily. 90 tablet 3  . B-D INS SYR ULTRAFINE 1CC/31G 31G X 5/16" 1 ML MISC     . calcium-vitamin D (CALCIUM 500+D) 500-400 MG-UNIT per tablet Take 2 tablets by mouth 2 (two) times daily.     . Coenzyme Q10 (COQ10) 200 MG CAPS Take 1 capsule by mouth every evening.     . docusate sodium (COLACE) 100 MG capsule Take 1 capsule (100 mg total) by mouth 2 (two) times daily. 10 capsule 0  . ezetimibe (ZETIA) 10 MG tablet Take 1 tablet (10 mg total) by mouth daily. 90 tablet 2  . fenofibrate (TRICOR) 145 MG tablet Take 1 tablet (145 mg total) by mouth daily. 90 tablet 3  . ferrous sulfate 325 (65 FE)  MG tablet Take 1 tablet (325 mg total) by mouth 3 (three) times daily after meals.  3  . Multiple Vitamin (MULTIVITAMIN) tablet Take 1 tablet by mouth every morning.     [name] NOVOLOG FLEXPEN 100 UNIT/ML FlexPen Inject 3 Units as directed 3 (three) times daily with meals.  4  . ONE TOUCH ULTRA TEST test strip 2 (two) times daily. test blood sugar  5  . ONETOUCH DELICA LANCETS 99991111 MISC CHECK BLOOD SUGARS 2 TIMES A DAY  5  . Polyethyl Glycol-Propyl Glycol (SYSTANE OP) Apply 1 drop to eye daily as needed (dry scratchy eyes).    . polyethylene glycol (MIRALAX / GLYCOLAX) packet Take 17 g by mouth 2 (two) times daily. 14 each 0  . sitaGLIPtan-metformin (JANUMET) 50-1000 MG per tablet Take 1 tablet by mouth 2 (two) times daily with a meal.    . TRESIBA FLEXTOUCH 100 UNIT/ML SOPN Inject 10 Units as directed daily.  4  . valsartan (DIOVAN) 80 MG tablet Take 80 mg by mouth as needed.  6  . vitamin C (ASCORBIC ACID) 500 MG tablet Take 500 mg by mouth every evening.      No current facility-administered medications for this visit.    Allergies:   Review of patient's allergies indicates no known allergies.    Social History:  The patient  reports that she has never smoked. She has never used smokeless tobacco. She reports that she does not drink alcohol or use illicit drugs.   Family History:  The patient's family history includes Cancer in her father; Heart attack in her mother; Stroke in her father.    ROS:  Please see the history of present illness.    Review of Systems: Constitutional:  denies fever, chills, diaphoresis, appetite change and fatigue.  HEENT: denies photophobia, eye pain, redness, hearing loss, ear pain, congestion, sore throat, rhinorrhea, sneezing, neck pain, neck stiffness and tinnitus.  Respiratory: denies SOB, DOE, cough, chest tightness, and wheezing.  Cardiovascular: denies chest pain, palpitations and leg swelling.  Gastrointestinal: denies nausea, vomiting, abdominal pain,  diarrhea, constipation, blood in stool.  Genitourinary: denies dysuria, urgency, frequency, hematuria, flank pain and difficulty urinating.  Musculoskeletal: denies  myalgias, back pain, joint swelling, arthralgias and gait problem.   Skin: denies pallor, rash and wound.  Neurological: denies dizziness, seizures, syncope, weakness, light-headedness, numbness and headaches.   Hematological: denies adenopathy, easy bruising, personal or family  bleeding history.  Psychiatric/ Behavioral: denies suicidal ideation, mood changes, confusion, nervousness, sleep disturbance and agitation.       All other systems are reviewed and negative.    PHYSICAL EXAM: VS:  BP 124/50 mmHg  Pulse 60  Ht 5\' 2"  (1.575 m)  Wt 114 lb 9.6 oz (51.982 kg)  BMI 20.96 kg/m2 , BMI Body mass index is 20.96 kg/(m^2). GEN: Well nourished, well developed, in no acute distress HEENT: normal Neck: no JVD, carotid bruits, or masses Cardiac: RRR; no murmurs, rubs, or gallops,no edema  Respiratory:  clear to auscultation bilaterally, normal work of breathing GI: soft, nontender, nondistended, + BS MS: no deformity or atrophy Skin: warm and dry, no rash Neuro:  Strength and sensation are intact Psych: normal   EKG:  EKG is ordered today. The ekg ordered today demonstrates : NSR at 74.  old inferior MI . Old ant. MI    Recent Labs: 03/14/2015: Hemoglobin 9.4*; Platelets 113* 07/02/2015: ALT 16; BUN 11; Creat 0.57*; Potassium 4.1; Sodium 134*    Lipid Panel    Component Value Date/Time   CHOL 84* 07/02/2015 0906   TRIG 39 07/02/2015 0906   HDL 32* 07/02/2015 0906   CHOLHDL 2.6 07/02/2015 0906   VLDL 8 07/02/2015 0906   LDLCALC 44 07/02/2015 0906      Wt Readings from Last 3 Encounters:  01/09/16 114 lb 9.6 oz (51.982 kg)  07/12/15 113 lb (51.256 kg)  03/12/15 116 lb (52.617 kg)      Other studies Reviewed: Additional studies/ records that were reviewed today include: . Review of the above records  demonstrates:    ASSESSMENT AND PLAN:  1. Coronary artery disease-status post PCI( April , 1998) and CABG ( Nov. 1998) - no angina  No dyspnea  2. Dyslipidemia -  On Zetia and fenofibrate 145.   Labs were drawn today   3. Diabetes mellitus  5. Right hip arthritis -      Current medicines are reviewed at length with the patient today.  The patient does not have concerns regarding medicines.  The following changes have been made:  no change  Labs/ tests ordered today include:  No orders of the defined types were placed in this encounter.     Disposition:   FU with me in 6 months    Mertie Moores, MD  01/09/2016 10:40 AM    [facility] [place], [place], [place]  [address] Phone: [phone]; Fax: [phone]   [facility]  [address] [place], [place]  [address] [phone]    Fax [phone]

## 2016-01-09 NOTE — Patient Instructions (Signed)
Medication Instructions:  Your physician recommends that you continue on your current medications as directed. Please refer to the Current Medication list given to you today.   Labwork: Already completed - I will call you with the results   Testing/Procedures: None Ordered   Follow-Up: Your physician wants you to follow-up in: 6 months with Dr. Acie Fredrickson.  You will receive a reminder letter in the mail two months in advance. If you don't receive a letter, please call our office to schedule the follow-up appointment.   If you need a refill on your cardiac medications before your next appointment, please call your pharmacy.   Thank you for choosing [facility]! Christen Bame, RN [phone]

## 2016-01-21 DIAGNOSIS — I10 Essential (primary) hypertension: Secondary | ICD-10-CM | POA: Diagnosis not present

## 2016-01-21 DIAGNOSIS — E1140 Type 2 diabetes mellitus with diabetic neuropathy, unspecified: Secondary | ICD-10-CM | POA: Diagnosis not present

## 2016-03-03 DIAGNOSIS — I6523 Occlusion and stenosis of bilateral carotid arteries: Secondary | ICD-10-CM | POA: Diagnosis not present

## 2016-03-03 DIAGNOSIS — E871 Hypo-osmolality and hyponatremia: Secondary | ICD-10-CM | POA: Diagnosis not present

## 2016-03-03 DIAGNOSIS — E559 Vitamin D deficiency, unspecified: Secondary | ICD-10-CM | POA: Diagnosis not present

## 2016-03-03 DIAGNOSIS — E042 Nontoxic multinodular goiter: Secondary | ICD-10-CM | POA: Diagnosis not present

## 2016-03-03 DIAGNOSIS — D500 Iron deficiency anemia secondary to blood loss (chronic): Secondary | ICD-10-CM | POA: Diagnosis not present

## 2016-03-03 DIAGNOSIS — E1142 Type 2 diabetes mellitus with diabetic polyneuropathy: Secondary | ICD-10-CM | POA: Diagnosis not present

## 2016-03-03 DIAGNOSIS — I2510 Atherosclerotic heart disease of native coronary artery without angina pectoris: Secondary | ICD-10-CM | POA: Diagnosis not present

## 2016-03-03 DIAGNOSIS — D696 Thrombocytopenia, unspecified: Secondary | ICD-10-CM | POA: Diagnosis not present

## 2016-03-03 DIAGNOSIS — R740 Nonspecific elevation of levels of transaminase and lactic acid dehydrogenase [LDH]: Secondary | ICD-10-CM | POA: Diagnosis not present

## 2016-03-03 DIAGNOSIS — E784 Other hyperlipidemia: Secondary | ICD-10-CM | POA: Diagnosis not present

## 2016-03-03 DIAGNOSIS — Z681 Body mass index (BMI) 19 or less, adult: Secondary | ICD-10-CM | POA: Diagnosis not present

## 2016-04-20 DIAGNOSIS — L821 Other seborrheic keratosis: Secondary | ICD-10-CM | POA: Diagnosis not present

## 2016-04-20 DIAGNOSIS — D225 Melanocytic nevi of trunk: Secondary | ICD-10-CM | POA: Diagnosis not present

## 2016-04-20 DIAGNOSIS — L720 Epidermal cyst: Secondary | ICD-10-CM | POA: Diagnosis not present

## 2016-04-22 DIAGNOSIS — E1140 Type 2 diabetes mellitus with diabetic neuropathy, unspecified: Secondary | ICD-10-CM | POA: Diagnosis not present

## 2016-04-22 DIAGNOSIS — Z6821 Body mass index (BMI) 21.0-21.9, adult: Secondary | ICD-10-CM | POA: Diagnosis not present

## 2016-04-22 DIAGNOSIS — Z23 Encounter for immunization: Secondary | ICD-10-CM | POA: Diagnosis not present

## 2016-04-22 DIAGNOSIS — I10 Essential (primary) hypertension: Secondary | ICD-10-CM | POA: Diagnosis not present

## 2016-06-06 DIAGNOSIS — M810 Age-related osteoporosis without current pathological fracture: Secondary | ICD-10-CM | POA: Diagnosis not present

## 2016-06-06 DIAGNOSIS — M8580 Other specified disorders of bone density and structure, unspecified site: Secondary | ICD-10-CM | POA: Diagnosis not present

## 2016-06-06 DIAGNOSIS — D696 Thrombocytopenia, unspecified: Secondary | ICD-10-CM | POA: Diagnosis not present

## 2016-06-06 DIAGNOSIS — E042 Nontoxic multinodular goiter: Secondary | ICD-10-CM | POA: Diagnosis not present

## 2016-06-06 DIAGNOSIS — E1142 Type 2 diabetes mellitus with diabetic polyneuropathy: Secondary | ICD-10-CM | POA: Diagnosis not present

## 2016-06-15 DIAGNOSIS — Z1231 Encounter for screening mammogram for malignant neoplasm of breast: Secondary | ICD-10-CM | POA: Diagnosis not present

## 2016-06-30 ENCOUNTER — Encounter: Payer: Self-pay | Admitting: Cardiovascular Disease

## 2016-07-07 ENCOUNTER — Ambulatory Visit: Payer: Medicare Other | Admitting: Cardiovascular Disease

## 2016-07-07 DIAGNOSIS — I10 Essential (primary) hypertension: Secondary | ICD-10-CM | POA: Diagnosis not present

## 2016-07-07 DIAGNOSIS — E042 Nontoxic multinodular goiter: Secondary | ICD-10-CM | POA: Diagnosis not present

## 2016-07-07 DIAGNOSIS — I351 Nonrheumatic aortic (valve) insufficiency: Secondary | ICD-10-CM | POA: Diagnosis not present

## 2016-07-07 DIAGNOSIS — D696 Thrombocytopenia, unspecified: Secondary | ICD-10-CM | POA: Diagnosis not present

## 2016-07-07 DIAGNOSIS — E1142 Type 2 diabetes mellitus with diabetic polyneuropathy: Secondary | ICD-10-CM | POA: Diagnosis not present

## 2016-07-07 DIAGNOSIS — I6523 Occlusion and stenosis of bilateral carotid arteries: Secondary | ICD-10-CM | POA: Diagnosis not present

## 2016-07-07 DIAGNOSIS — D500 Iron deficiency anemia secondary to blood loss (chronic): Secondary | ICD-10-CM | POA: Diagnosis not present

## 2016-07-07 DIAGNOSIS — M810 Age-related osteoporosis without current pathological fracture: Secondary | ICD-10-CM | POA: Diagnosis not present

## 2016-07-07 DIAGNOSIS — I2510 Atherosclerotic heart disease of native coronary artery without angina pectoris: Secondary | ICD-10-CM | POA: Diagnosis not present

## 2016-07-07 DIAGNOSIS — E784 Other hyperlipidemia: Secondary | ICD-10-CM | POA: Diagnosis not present

## 2016-07-07 DIAGNOSIS — Z6821 Body mass index (BMI) 21.0-21.9, adult: Secondary | ICD-10-CM | POA: Diagnosis not present

## 2016-07-07 DIAGNOSIS — E871 Hypo-osmolality and hyponatremia: Secondary | ICD-10-CM | POA: Diagnosis not present

## 2016-07-13 ENCOUNTER — Ambulatory Visit: Payer: Medicare Other | Admitting: Cardiovascular Disease

## 2016-07-23 ENCOUNTER — Encounter: Payer: Self-pay | Admitting: Cardiovascular Disease

## 2016-07-23 ENCOUNTER — Ambulatory Visit (INDEPENDENT_AMBULATORY_CARE_PROVIDER_SITE_OTHER): Payer: Medicare Other | Admitting: Cardiovascular Disease

## 2016-07-23 ENCOUNTER — Encounter: Payer: Self-pay | Admitting: Nurse Practitioner

## 2016-07-23 VITALS — BP 150/70 | HR 70 | Ht 62.0 in | Wt 123.2 lb

## 2016-07-23 DIAGNOSIS — I2510 Atherosclerotic heart disease of native coronary artery without angina pectoris: Secondary | ICD-10-CM | POA: Diagnosis not present

## 2016-07-23 DIAGNOSIS — I25110 Atherosclerotic heart disease of native coronary artery with unstable angina pectoris: Secondary | ICD-10-CM | POA: Diagnosis not present

## 2016-07-23 DIAGNOSIS — I200 Unstable angina: Secondary | ICD-10-CM | POA: Diagnosis not present

## 2016-07-23 LAB — BASIC METABOLIC PANEL
BUN: 11 mg/dL (ref 7–25)
CHLORIDE: 100 mmol/L (ref 98–110)
CO2: 28 mmol/L (ref 20–31)
CREATININE: 0.8 mg/dL (ref 0.60–0.88)
Calcium: 9.5 mg/dL (ref 8.6–10.4)
GLUCOSE: 97 mg/dL (ref 65–99)
Potassium: 4 mmol/L (ref 3.5–5.3)
Sodium: 136 mmol/L (ref 135–146)

## 2016-07-23 LAB — CBC
HCT: 37.6 % (ref 35.0–45.0)
Hemoglobin: 12.3 g/dL (ref 11.7–15.5)
MCH: 29.9 pg (ref 27.0–33.0)
MCHC: 32.7 g/dL (ref 32.0–36.0)
MCV: 91.5 fL (ref 80.0–100.0)
MPV: 10.4 fL (ref 7.5–12.5)
PLATELETS: 194 10*3/uL (ref 140–400)
RBC: 4.11 MIL/uL (ref 3.80–5.10)
RDW: 14.2 % (ref 11.0–15.0)
WBC: 5 10*3/uL (ref 3.8–10.8)

## 2016-07-23 MED ORDER — NITROGLYCERIN 0.4 MG SL SUBL: 0.4000 mg | SUBLINGUAL_TABLET | SUBLINGUAL | 6 refills | Status: DC | PRN

## 2016-07-23 NOTE — Progress Notes (Signed)
Cardiology Office Note   Date:  07/23/2016   ID:  Cynthia [name], DOB 1932/02/09, MRN [id number]  PCP:  Sheela Stack, MD  Cardiologist:   Mertie Moores, MD   Chief Complaint  Patient presents with  . Coronary Artery Disease   1. Coronary artery disease-status post PCI( April , 1998) and CABG ( Nov. 1998) 2. Dyslipidemia 3. Diabetes mellitus 4. Left carotid bruit 5. Right hip arthritis   History of Present Illness: Cynthia [name] is a 82 y.o. y.o. female with a history of coronary artery disease. She status post PCI and then later had coronary artery bypass grafting. She also has a history of dyslipidemia, diabetes mellitus, and a left carotid bruit.  She has done well from a cardiac standpoint. She has not been exercising as much as she has in the past. She complains of generalized fatigue. She stays busy doing chores. 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  January 16, 2013:  Cynthia [name] is dong well. She is getting over several viral infections over the past few months. No CP.   Dec. 22,2014:  Cynthia [name] has had a rough summer. Also has had a cold. Her husband has been at [facility] since early Sept. No cardiac issues.   January 08, 2014:  Cynthia [name] is doing ok. She has lost lots of weight over the past year or so and now is gaining some weight back. Her has an abdominal bruit. Renal artery duplex study was normal.   She has started insulin therapy.   Dec. 1, 2015:  Cynthia [name] is an 82 yo who I follow for CAD, dyslipidema.  She has had some BP variablility.  No CP,  Has not been exercising. Has hip / back problems.  Her husband passed away this past 03/04/2023.    January 11, 2015:  Cynthia [name] is a 82 y.o. female who presents for  Follow up of her CAD and HTN. Having lots of pain this am - as a result , her BP is high  May need to have hip surgery    Dec. 2, 2016:  Doing well.  No cardiac issues. Had her R hip replacement.  [address] and broke  her left arm while using her walker.   Bp and HR are well controlled.    January 09, 2016: Doing well.  No CP or dyspnea. Does not have the endurance that she used to have   Dec. 14, 2017  Having more DOE and chest pressure with exertion. Similar to prior to her CABG  Has not tried a SL NTG  Her symptoms started 3 weeks ago. Now she cannot do any exercise before she gets some chest tightness.   Past Medical History:  Diagnosis Date  . Anxiety   . Arthritis   . Carotid bruit    LEFT  . Coronary artery disease    STATUS POST PCI AND LATER CABG  . Diabetes mellitus   . Dyslipidemia   . Dysrhythmia    hx of palpitations and PVC's   . History of chicken pox   . Hypertension   . Measles    hx of   . Mumps    hx of   . Myocardial infarction   . Palpitations   . PVC's (premature ventricular contractions)     Past Surgical History:  Procedure Laterality Date  . CARDIAC CATHETERIZATION  06/06/97   IT REVEALA MILD TO MODERATE LEFT VENTRICULAR SYSTOLIC DYSFUNCTION  WITH EF OF  40%. THERE IS PERSISTENT AKINESIS OF THE INFERIOR WALL. THERE IS MILD REGURGITATION PRESENT. THERE IS MILD HYPOKINESIS OF THE ANTERIOR  AND APEX WALL  . CORONARY ARTERY BYPASS GRAFT    . TONSILLECTOMY    . TOTAL HIP ARTHROPLASTY Right 03/12/2015   Procedure: RIGHT TOTAL HIP ARTHROPLASTY ANTERIOR APPROACH;  Surgeon: Paralee Cancel, MD;  Location: [facility];  Service: Orthopedics;  Laterality: Right;     Current Outpatient Prescriptions  Medication Sig Dispense Refill  . aspirin EC 81 MG tablet Take 81 mg by mouth daily.    [name] atorvastatin (LIPITOR) 40 MG tablet Take 1 tablet (40 mg total) by mouth daily. 90 tablet 3  . B-D INS SYR ULTRAFINE 1CC/31G 31G X 5/16" 1 ML MISC     . calcium-vitamin D (CALCIUM 500+D) 500-400 MG-UNIT per tablet Take 2 tablets by mouth 2 (two) times daily.     . Coenzyme Q10 (COQ10) 200 MG CAPS Take 1 capsule by mouth every evening.     . ezetimibe (ZETIA) 10 MG tablet Take 1 tablet (10 mg  total) by mouth daily. 90 tablet 2  . fenofibrate (TRICOR) 145 MG tablet Take 1 tablet (145 mg total) by mouth daily. 90 tablet 3  . Multiple Vitamin (MULTIVITAMIN) tablet Take 1 tablet by mouth every morning.     [name] NOVOLOG FLEXPEN 100 UNIT/ML FlexPen Inject 3 Units as directed 3 (three) times daily with meals.  4  . ONE TOUCH ULTRA TEST test strip 2 (two) times daily. test blood sugar  5  . ONETOUCH DELICA LANCETS 99991111 MISC CHECK BLOOD SUGARS 2 TIMES A DAY  5  . Polyethyl Glycol-Propyl Glycol (SYSTANE OP) Apply 1 drop to eye daily as needed (dry scratchy eyes).    . polyethylene glycol (MIRALAX / GLYCOLAX) packet Take 17 g by mouth 2 (two) times daily. 14 each 0  . sitaGLIPtan-metformin (JANUMET) 50-1000 MG per tablet Take 1 tablet by mouth 2 (two) times daily with a meal.    . TRESIBA FLEXTOUCH 100 UNIT/ML SOPN Inject 10 Units as directed daily.  4  . valsartan (DIOVAN) 80 MG tablet Take 40 mg by mouth as needed.  6  . vitamin C (ASCORBIC ACID) 500 MG tablet Take 500 mg by mouth every evening.      No current facility-administered medications for this visit.     Allergies:   Patient has no known allergies.    Social History:  The patient  reports that she has never smoked. She has never used smokeless tobacco. She reports that she does not drink alcohol or use drugs.   Family History:  The patient's family history includes Cancer in her father; Heart attack in her mother; Stroke in her father.    ROS:  Please see the history of present illness.    Review of Systems: Constitutional:  denies fever, chills, diaphoresis, appetite change and fatigue.  HEENT: denies photophobia, eye pain, redness, hearing loss, ear pain, congestion, sore throat, rhinorrhea, sneezing, neck pain, neck stiffness and tinnitus.  Respiratory: denies SOB, DOE, cough, chest tightness, and wheezing.  Cardiovascular: admits to chest pain,   Gastrointestinal: denies nausea, vomiting, abdominal pain, diarrhea,  constipation, blood in stool.  Genitourinary: denies dysuria, urgency, frequency, hematuria, flank pain and difficulty urinating.  Musculoskeletal: denies  myalgias, back pain, joint swelling, arthralgias and gait problem.   Skin: denies pallor, rash and wound.  Neurological: denies dizziness, seizures, syncope, weakness, light-headedness, numbness and headaches.   Hematological: denies adenopathy, easy bruising, personal or  family bleeding history.  Psychiatric/ Behavioral: denies suicidal ideation, mood changes, confusion, nervousness, sleep disturbance and agitation.       All other systems are reviewed and negative.    PHYSICAL EXAM: VS:  BP (!) 150/70 (BP Location: Right Arm, Patient Position: Sitting, Cuff Size: Normal)   Pulse 70   Ht 5\' 2"  (1.575 m)   Wt 123 lb 3.2 oz (55.9 kg)   BMI 22.53 kg/m  , BMI Body mass index is 22.53 kg/m. GEN: Well nourished, well developed, in no acute distress  HEENT: normal  Neck: no JVD, carotid bruits, or masses Cardiac: RRR; no murmurs, rubs, or gallops,no edema  Respiratory:  clear to auscultation bilaterally, normal work of breathing GI: soft, nontender, nondistended, + BS MS: no deformity or atrophy  Skin: warm and dry, no rash Neuro:  Strength and sensation are intact Psych: normal   EKG:  EKG is ordered today. The ekg ordered today demonstrates : NSR at 77.  NS IVCD.     Recent Labs: 01/09/2016: ALT 18; BUN 12; Creat 0.63; Potassium 4.1; Sodium 132    Lipid Panel    Component Value Date/Time   CHOL 101 (L) 01/09/2016 0952   TRIG 42 01/09/2016 0952   HDL 40 (L) 01/09/2016 0952   CHOLHDL 2.5 01/09/2016 0952   VLDL 8 01/09/2016 0952   LDLCALC 53 01/09/2016 0952      Wt Readings from Last 3 Encounters:  07/23/16 123 lb 3.2 oz (55.9 kg)  01/09/16 114 lb 9.6 oz (52 kg)  07/12/15 113 lb (51.3 kg)      Other studies Reviewed: Additional studies/ records that were reviewed today include: . Review of the above records  demonstrates:    ASSESSMENT AND PLAN:  1. Coronary artery disease-status post PCI( April , 1998) and CABG ( Nov. 1998) - She's now having symptoms of unstable angina. She has exertional chest tightness and chest pressure with any sort of exertion. These symptoms started approximately 3 weeks ago. I'm concerned that this represents unstable angina. Her EKG shows a new left bundle-like configuration.  We discussed the risks, benefits, and options. She understands and agrees to proceed.  2. Dyslipidemia -  On Zetia and fenofibrate 145.   Labs were drawn today   3. Diabetes mellitus  5. Right hip arthritis -      Current medicines are reviewed at length with the patient today.  The patient does not have concerns regarding medicines.  The following changes have been made:  no change  Labs/ tests ordered today include:  No orders of the defined types were placed in this encounter.    Disposition:   FU with me in 6 months    Mertie Moores, MD  07/23/2016 2:07 PM    [facility] [place], [place], [place]  [address] Phone: [phone]; Fax: [phone]   [facility]  [address] [place], [place]  [address] [phone]    Fax [phone]

## 2016-07-23 NOTE — Patient Instructions (Signed)
Medication Instructions:  Your physician recommends that you continue on your current medications as directed. Please refer to the Current Medication list given to you today.   Labwork: TODAY - Pt/INR, basic metabolic panel, CBC   Testing/Procedures: Your physician has requested that you have a cardiac catheterization. Cardiac catheterization is used to diagnose and/or treat various heart conditions. Doctors may recommend this procedure for a number of different reasons. The most common reason is to evaluate chest pain. Chest pain can be a symptom of coronary artery disease (CAD), and cardiac catheterization can show whether plaque is narrowing or blocking your heart's arteries. This procedure is also used to evaluate the valves, as well as measure the blood flow and oxygen levels in different parts of your heart. For further information please visit [email or web address]. Please follow instruction sheet, as given.   Follow-Up: Your physician recommends that you schedule a follow-up appointment in: 6 weeks with Dr. Acie Fredrickson.    If you need a refill on your cardiac medications before your next appointment, please call your pharmacy.   Thank you for choosing [facility]! Christen Bame, RN [phone]

## 2016-07-24 LAB — PROTIME-INR
INR: 1.2 — ABNORMAL HIGH
PROTHROMBIN TIME: 12.7 s — AB (ref 9.0–11.5)

## 2016-07-27 ENCOUNTER — Encounter (HOSPITAL_COMMUNITY)
Admission: RE | Disposition: A | Discharge status: Home / Self Care | Payer: Self-pay | Source: Ambulatory Visit | Attending: Internal Medicine

## 2016-07-27 ENCOUNTER — Ambulatory Visit (HOSPITAL_COMMUNITY)
Admission: RE | Admit: 2016-07-27 | Discharge: 2016-07-27 | Disposition: A | Discharge status: Home / Self Care | Payer: Medicare Other | Source: Ambulatory Visit | Attending: Internal Medicine | Admitting: Internal Medicine

## 2016-07-27 ENCOUNTER — Encounter (HOSPITAL_COMMUNITY): Payer: Self-pay | Admitting: *Deleted

## 2016-07-27 DIAGNOSIS — I25110 Atherosclerotic heart disease of native coronary artery with unstable angina pectoris: Secondary | ICD-10-CM

## 2016-07-27 DIAGNOSIS — Z823 Family history of stroke: Secondary | ICD-10-CM | POA: Diagnosis not present

## 2016-07-27 DIAGNOSIS — Z96641 Presence of right artificial hip joint: Secondary | ICD-10-CM | POA: Diagnosis not present

## 2016-07-27 DIAGNOSIS — I252 Old myocardial infarction: Secondary | ICD-10-CM | POA: Insufficient documentation

## 2016-07-27 DIAGNOSIS — I2582 Chronic total occlusion of coronary artery: Secondary | ICD-10-CM | POA: Insufficient documentation

## 2016-07-27 DIAGNOSIS — R079 Chest pain, unspecified: Secondary | ICD-10-CM | POA: Diagnosis present

## 2016-07-27 DIAGNOSIS — Z8249 Family history of ischemic heart disease and other diseases of the circulatory system: Secondary | ICD-10-CM | POA: Diagnosis not present

## 2016-07-27 DIAGNOSIS — E119 Type 2 diabetes mellitus without complications: Secondary | ICD-10-CM | POA: Insufficient documentation

## 2016-07-27 DIAGNOSIS — M1611 Unilateral primary osteoarthritis, right hip: Secondary | ICD-10-CM | POA: Diagnosis not present

## 2016-07-27 DIAGNOSIS — I10 Essential (primary) hypertension: Secondary | ICD-10-CM | POA: Diagnosis not present

## 2016-07-27 DIAGNOSIS — Z955 Presence of coronary angioplasty implant and graft: Secondary | ICD-10-CM | POA: Insufficient documentation

## 2016-07-27 DIAGNOSIS — E785 Hyperlipidemia, unspecified: Secondary | ICD-10-CM | POA: Insufficient documentation

## 2016-07-27 DIAGNOSIS — F419 Anxiety disorder, unspecified: Secondary | ICD-10-CM | POA: Insufficient documentation

## 2016-07-27 DIAGNOSIS — I25700 Atherosclerosis of coronary artery bypass graft(s), unspecified, with unstable angina pectoris: Secondary | ICD-10-CM | POA: Diagnosis not present

## 2016-07-27 DIAGNOSIS — Z794 Long term (current) use of insulin: Secondary | ICD-10-CM | POA: Diagnosis not present

## 2016-07-27 DIAGNOSIS — Z7982 Long term (current) use of aspirin: Secondary | ICD-10-CM | POA: Diagnosis not present

## 2016-07-27 DIAGNOSIS — I2510 Atherosclerotic heart disease of native coronary artery without angina pectoris: Secondary | ICD-10-CM | POA: Diagnosis present

## 2016-07-27 HISTORY — PX: CARDIAC CATHETERIZATION: SHX172

## 2016-07-27 LAB — GLUCOSE, CAPILLARY
GLUCOSE-CAPILLARY: 188 mg/dL — AB (ref 65–99)
Glucose-Capillary: 191 mg/dL — ABNORMAL HIGH (ref 65–99)
Glucose-Capillary: 389 mg/dL — ABNORMAL HIGH (ref 65–99)
Glucose-Capillary: 417 mg/dL — ABNORMAL HIGH (ref 65–99)

## 2016-07-27 SURGERY — LEFT HEART CATH AND CORS/GRAFTS ANGIOGRAPHY
Anesthesia: LOCAL

## 2016-07-27 MED ORDER — MIDAZOLAM HCL 2 MG/2ML IJ SOLN
INTRAMUSCULAR | Status: DC | PRN
  Administered 2016-07-27: 1 mg via INTRAVENOUS

## 2016-07-27 MED ORDER — MIDAZOLAM HCL 2 MG/2ML IJ SOLN
INTRAMUSCULAR | Status: AC
  Filled 2016-07-27: qty 2

## 2016-07-27 MED ORDER — SODIUM CHLORIDE 0.9 % WEIGHT BASED INFUSION
3.0000 mL/kg/h | INTRAVENOUS | Status: AC
  Administered 2016-07-27: 3 mL/kg/h via INTRAVENOUS

## 2016-07-27 MED ORDER — FENTANYL CITRATE (PF) 100 MCG/2ML IJ SOLN
INTRAMUSCULAR | Status: AC
  Filled 2016-07-27: qty 2

## 2016-07-27 MED ORDER — HEPARIN (PORCINE) IN NACL 2-0.9 UNIT/ML-% IJ SOLN
INTRAMUSCULAR | Status: DC | PRN
  Administered 2016-07-27: 1000 mL

## 2016-07-27 MED ORDER — ISOSORBIDE MONONITRATE ER 30 MG PO TB24: 30.0000 mg | ORAL_TABLET | Freq: Every day | ORAL | 5 refills | Status: DC

## 2016-07-27 MED ORDER — VERAPAMIL HCL 2.5 MG/ML IV SOLN
INTRAVENOUS | Status: AC
  Filled 2016-07-27: qty 2

## 2016-07-27 MED ORDER — LIDOCAINE HCL (PF) 1 % IJ SOLN
INTRAMUSCULAR | Status: AC
  Filled 2016-07-27: qty 30

## 2016-07-27 MED ORDER — SODIUM CHLORIDE 0.9% FLUSH: 3.0000 mL | Freq: Two times a day (BID) | INTRAVENOUS | Status: DC

## 2016-07-27 MED ORDER — SODIUM CHLORIDE 0.9 % WEIGHT BASED INFUSION: 1.0000 mL/kg/h | INTRAVENOUS | Status: DC

## 2016-07-27 MED ORDER — IOPAMIDOL (ISOVUE-370) INJECTION 76%
INTRAVENOUS | Status: DC | PRN
  Administered 2016-07-27: 95 mL via INTRA_ARTERIAL

## 2016-07-27 MED ORDER — HEPARIN (PORCINE) IN NACL 2-0.9 UNIT/ML-% IJ SOLN
INTRAMUSCULAR | Status: AC
  Filled 2016-07-27: qty 1000

## 2016-07-27 MED ORDER — SODIUM CHLORIDE 0.9% FLUSH: 3.0000 mL | INTRAVENOUS | Status: DC | PRN

## 2016-07-27 MED ORDER — SODIUM CHLORIDE 0.9 % IV SOLN: 250.0000 mL | INTRAVENOUS | Status: DC | PRN

## 2016-07-27 MED ORDER — IOPAMIDOL (ISOVUE-370) INJECTION 76%
INTRAVENOUS | Status: AC
  Filled 2016-07-27: qty 125

## 2016-07-27 MED ORDER — ASPIRIN 81 MG PO CHEW: 81.0000 mg | CHEWABLE_TABLET | ORAL | Status: DC

## 2016-07-27 MED ORDER — SODIUM CHLORIDE 0.9 % IV SOLN: INTRAVENOUS | Status: AC

## 2016-07-27 MED ORDER — FENTANYL CITRATE (PF) 100 MCG/2ML IJ SOLN
INTRAMUSCULAR | Status: DC | PRN
  Administered 2016-07-27: 25 ug via INTRAVENOUS

## 2016-07-27 MED ORDER — FUROSEMIDE 40 MG PO TABS: 40.0000 mg | ORAL_TABLET | Freq: Every day | ORAL | 5 refills | Status: DC

## 2016-07-27 MED ORDER — POTASSIUM CHLORIDE ER 10 MEQ PO TBCR: 20.0000 meq | EXTENDED_RELEASE_TABLET | Freq: Every day | ORAL | 5 refills | Status: DC

## 2016-07-27 MED ORDER — LIDOCAINE HCL (PF) 1 % IJ SOLN
INTRAMUSCULAR | Status: DC | PRN
  Administered 2016-07-27: 20 mL via INTRADERMAL

## 2016-07-27 MED ORDER — SITAGLIPTIN PHOS-METFORMIN HCL 50-1000 MG PO TABS: 1.0000 | ORAL_TABLET | Freq: Two times a day (BID) | ORAL | Status: DC

## 2016-07-27 SURGICAL SUPPLY — 13 items
CATH EXPO 5F IM (CATHETERS) ×1 IMPLANT
CATH INFINITI 5FR MULTPACK ANG (CATHETERS) ×1 IMPLANT
COVER PRB 48X5XTLSCP FOLD TPE (BAG) IMPLANT
COVER PROBE 5X48 (BAG) ×2
COVER SWIFTLINK CONNECTOR (BAG) IMPLANT
KIT HEART LEFT (KITS) ×2 IMPLANT
PACK CARDIAC CATHETERIZATION (CUSTOM PROCEDURE TRAY) ×2 IMPLANT
SET INTRODUCER MICROPUNCT 5F (INTRODUCER) ×1 IMPLANT
SHEATH PINNACLE 5F 10CM (SHEATH) ×1 IMPLANT
SYR MEDRAD MARK V 150ML (SYRINGE) ×2 IMPLANT
TRANSDUCER W/STOPCOCK (MISCELLANEOUS) ×2 IMPLANT
TUBING CIL FLEX 10 FLL-RA (TUBING) ×2 IMPLANT
WIRE EMERALD 3MM-J .035X150CM (WIRE) ×1 IMPLANT

## 2016-07-27 NOTE — Interval H&P Note (Signed)
History and Physical Interval Note:  07/27/2016 8:46 AM  Cynthia [name]  has presented today for cardiac catheterization, with the diagnosis of CAD, unstable angina  The various methods of treatment have been discussed with the patient and family. After consideration of risks, benefits and other options for treatment, the patient has consented to  Procedure(s): Left Heart Cath and Cors/Grafts Angiography (N/A) as a surgical intervention .  The patient's history has been reviewed, patient examined, no change in status, stable for surgery.  I have reviewed the patient's chart and labs.  Questions were answered to the patient's satisfaction.    Cath Lab Visit (complete for each Cath Lab visit)  Clinical Evaluation Leading to the Procedure:   ACS: Yes.   (unstable angina)  Non-ACS:    Anginal Classification: CCS III  Anti-ischemic medical therapy: No Therapy  Non-Invasive Test Results: No non-invasive testing performed  Prior CABG: Previous CABG  [name]

## 2016-07-27 NOTE — Progress Notes (Signed)
Pt ambulated without dificulty or bleeding.  Pt getting dressed to go home and stated she felt a little shaky. CBG obtained. Blood suger is 415. Reported to Dr. Saunders Revel who came to see pt. Dr. Saunders Revel instructed pt to take 3units of her home fast acting insulin and half of her long acting insulin.  0240 Pt followed instructions and we will do a follow up CBG at 3 30

## 2016-07-27 NOTE — Discharge Instructions (Signed)

## 2016-07-27 NOTE — Progress Notes (Signed)
Site area: Right groin a 5 french arterial sheath by Tammy Mink RCIS  Site Prior to Removal:  Level 0  Pressure Applied For 20 MINUTES    Bedrest Beginning at 1025am  Manual:   Yes.    Patient Status During Pull:  stable  Post Pull Groin Site:  Level 0  Post Pull Instructions Given:  Yes.    Post Pull Pulses Present:  Yes.    Dressing Applied:  Yes.    Comments:  VS remain stable during sheath pull

## 2016-07-27 NOTE — Brief Op Note (Signed)
Brief Cardiac Catheterization Note (Full Report to Follow)  Date: 07/27/2016 Time: 9:57 AM  PATIENT:  Cynthia [name]  82 y.o. female  PRE-OPERATIVE DIAGNOSIS:  CAD, unstable angina  POST-OPERATIVE DIAGNOSIS: CAD, ischemic cardiomyopathy  PROCEDURE:  Procedure(s): Left Heart Cath and Cors/Grafts Angiography (N/A)  SURGEON:  Surgeon(s) and Role:    * Nelva Bush, MD - Primary  FINDINGS: 1. Two vessel native coronary artery disease, with ostially occluded LCx and ulcerated 50% mid RCA lesions. 2. Patent SVG to OM and SVG to diagonal. 3. Ostially occluded SVG to RCA. 4. Atretic LIMA. 5. Mildly elevated LVEDP. 6. Moderately reduced LV contraction with inferior and apical akinesis.  Recommendations: 1. Start isosorbide mononitrate 30 mg daily, furosemide 40 mg daily, and KCl 20 mEq daily. 2. Continue remainder of medications. 3. Follow-up in clinic in 1 week.  Findings and recommendations were discussed with Dr. Acie Fredrickson.  Nelva Bush, MD [facility] Pager: [phone]

## 2016-07-27 NOTE — Progress Notes (Signed)
Pt states she feels much better. CBG down to 389. Dr, End notified and gave ok for pt to go home.He   Instructed pt to keep a close check on her blood sugar today and notify her primary for further insulin instructions if needed.  Pt verbalizes understanding and denies further questions.

## 2016-07-27 NOTE — H&P (View-Only) (Signed)
Cardiology Office Note   Date:  07/23/2016   ID:  Cynthia [name], DOB October 17, 1931, MRN [id number]  PCP:  Sheela Stack, MD  Cardiologist:   Mertie Moores, MD   Chief Complaint  Patient presents with  . Coronary Artery Disease   1. Coronary artery disease-status post PCI( April , 1998) and CABG ( Nov. 1998) 2. Dyslipidemia 3. Diabetes mellitus 4. Left carotid bruit 5. Right hip arthritis   History of Present Illness: Cynthia [name] is a 82 y.o. y.o. female with a history of coronary artery disease. She status post PCI and then later had coronary artery bypass grafting. She also has a history of dyslipidemia, diabetes mellitus, and a left carotid bruit.  She has done well from a cardiac standpoint. She has not been exercising as much as she has in the past. She complains of generalized fatigue. She stays busy doing chores. 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  January 16, 2013:  Cynthia [name] is dong well. She is getting over several viral infections over the past few months. No CP.   Dec. 22,2014:  Cynthia [name] has had a rough summer. Also has had a cold. Her husband has been at [facility] since early Sept. No cardiac issues.   January 08, 2014:  Cynthia [name] is doing ok. She has lost lots of weight over the past year or so and now is gaining some weight back. Her has an abdominal bruit. Renal artery duplex study was normal.   She has started insulin therapy.   Dec. 1, 2015:  Cynthia [name] is an 82 yo who I follow for CAD, dyslipidema.  She has had some BP variablility.  No CP,  Has not been exercising. Has hip / back problems.  Her husband passed away this past 02/14/2023.    January 11, 2015:  Cynthia [name] is a 82 y.o. female who presents for  Follow up of her CAD and HTN. Having lots of pain this am - as a result , her BP is high  May need to have hip surgery    Dec. 2, 2016:  Doing well.  No cardiac issues. Had her R hip replacement.  [place] and broke  her left arm while using her walker.   Bp and HR are well controlled.    January 09, 2016: Doing well.  No CP or dyspnea. Does not have the endurance that she used to have   Dec. 14, 2017  Having more DOE and chest pressure with exertion. Similar to prior to her CABG  Has not tried a SL NTG  Her symptoms started 3 weeks ago. Now she cannot do any exercise before she gets some chest tightness.   Past Medical History:  Diagnosis Date  . Anxiety   . Arthritis   . Carotid bruit    LEFT  . Coronary artery disease    STATUS POST PCI AND LATER CABG  . Diabetes mellitus   . Dyslipidemia   . Dysrhythmia    hx of palpitations and PVC's   . History of chicken pox   . Hypertension   . Measles    hx of   . Mumps    hx of   . Myocardial infarction   . Palpitations   . PVC's (premature ventricular contractions)     Past Surgical History:  Procedure Laterality Date  . CARDIAC CATHETERIZATION  06/06/97   IT REVEALA MILD TO MODERATE LEFT VENTRICULAR SYSTOLIC DYSFUNCTION  WITH EF OF  40%. THERE IS PERSISTENT AKINESIS OF THE INFERIOR WALL. THERE IS MILD REGURGITATION PRESENT. THERE IS MILD HYPOKINESIS OF THE ANTERIOR  AND APEX WALL  . CORONARY ARTERY BYPASS GRAFT    . TONSILLECTOMY    . TOTAL HIP ARTHROPLASTY Right 03/12/2015   Procedure: RIGHT TOTAL HIP ARTHROPLASTY ANTERIOR APPROACH;  Surgeon: Paralee Cancel, MD;  Location: [facility];  Service: Orthopedics;  Laterality: Right;     Current Outpatient Prescriptions  Medication Sig Dispense Refill  . aspirin EC 81 MG tablet Take 81 mg by mouth daily.    [name] atorvastatin (LIPITOR) 40 MG tablet Take 1 tablet (40 mg total) by mouth daily. 90 tablet 3  . B-D INS SYR ULTRAFINE 1CC/31G 31G X 5/16" 1 ML MISC     . calcium-vitamin D (CALCIUM 500+D) 500-400 MG-UNIT per tablet Take 2 tablets by mouth 2 (two) times daily.     . Coenzyme Q10 (COQ10) 200 MG CAPS Take 1 capsule by mouth every evening.     . ezetimibe (ZETIA) 10 MG tablet Take 1 tablet (10 mg  total) by mouth daily. 90 tablet 2  . fenofibrate (TRICOR) 145 MG tablet Take 1 tablet (145 mg total) by mouth daily. 90 tablet 3  . Multiple Vitamin (MULTIVITAMIN) tablet Take 1 tablet by mouth every morning.     [name] NOVOLOG FLEXPEN 100 UNIT/ML FlexPen Inject 3 Units as directed 3 (three) times daily with meals.  4  . ONE TOUCH ULTRA TEST test strip 2 (two) times daily. test blood sugar  5  . ONETOUCH DELICA LANCETS 99991111 MISC CHECK BLOOD SUGARS 2 TIMES A DAY  5  . Polyethyl Glycol-Propyl Glycol (SYSTANE OP) Apply 1 drop to eye daily as needed (dry scratchy eyes).    . polyethylene glycol (MIRALAX / GLYCOLAX) packet Take 17 g by mouth 2 (two) times daily. 14 each 0  . sitaGLIPtan-metformin (JANUMET) 50-1000 MG per tablet Take 1 tablet by mouth 2 (two) times daily with a meal.    . TRESIBA FLEXTOUCH 100 UNIT/ML SOPN Inject 10 Units as directed daily.  4  . valsartan (DIOVAN) 80 MG tablet Take 40 mg by mouth as needed.  6  . vitamin C (ASCORBIC ACID) 500 MG tablet Take 500 mg by mouth every evening.      No current facility-administered medications for this visit.     Allergies:   Patient has no known allergies.    Social History:  The patient  reports that she has never smoked. She has never used smokeless tobacco. She reports that she does not drink alcohol or use drugs.   Family History:  The patient's family history includes Cancer in her father; Heart attack in her mother; Stroke in her father.    ROS:  Please see the history of present illness.    Review of Systems: Constitutional:  denies fever, chills, diaphoresis, appetite change and fatigue.  HEENT: denies photophobia, eye pain, redness, hearing loss, ear pain, congestion, sore throat, rhinorrhea, sneezing, neck pain, neck stiffness and tinnitus.  Respiratory: denies SOB, DOE, cough, chest tightness, and wheezing.  Cardiovascular: admits to chest pain,   Gastrointestinal: denies nausea, vomiting, abdominal pain, diarrhea,  constipation, blood in stool.  Genitourinary: denies dysuria, urgency, frequency, hematuria, flank pain and difficulty urinating.  Musculoskeletal: denies  myalgias, back pain, joint swelling, arthralgias and gait problem.   Skin: denies pallor, rash and wound.  Neurological: denies dizziness, seizures, syncope, weakness, light-headedness, numbness and headaches.   Hematological: denies adenopathy, easy bruising, personal or  family bleeding history.  Psychiatric/ Behavioral: denies suicidal ideation, mood changes, confusion, nervousness, sleep disturbance and agitation.       All other systems are reviewed and negative.    PHYSICAL EXAM: VS:  BP (!) 150/70 (BP Location: Right Arm, Patient Position: Sitting, Cuff Size: Normal)   Pulse 70   Ht 5\' 2"  (1.575 m)   Wt 123 lb 3.2 oz (55.9 kg)   BMI 22.53 kg/m  , BMI Body mass index is 22.53 kg/m. GEN: Well nourished, well developed, in no acute distress  HEENT: normal  Neck: no JVD, carotid bruits, or masses Cardiac: RRR; no murmurs, rubs, or gallops,no edema  Respiratory:  clear to auscultation bilaterally, normal work of breathing GI: soft, nontender, nondistended, + BS MS: no deformity or atrophy  Skin: warm and dry, no rash Neuro:  Strength and sensation are intact Psych: normal   EKG:  EKG is ordered today. The ekg ordered today demonstrates : NSR at 77.  NS IVCD.     Recent Labs: 01/09/2016: ALT 18; BUN 12; Creat 0.63; Potassium 4.1; Sodium 132    Lipid Panel    Component Value Date/Time   CHOL 101 (L) 01/09/2016 0952   TRIG 42 01/09/2016 0952   HDL 40 (L) 01/09/2016 0952   CHOLHDL 2.5 01/09/2016 0952   VLDL 8 01/09/2016 0952   LDLCALC 53 01/09/2016 0952      Wt Readings from Last 3 Encounters:  07/23/16 123 lb 3.2 oz (55.9 kg)  01/09/16 114 lb 9.6 oz (52 kg)  07/12/15 113 lb (51.3 kg)      Other studies Reviewed: Additional studies/ records that were reviewed today include: . Review of the above records  demonstrates:    ASSESSMENT AND PLAN:  1. Coronary artery disease-status post PCI( April , 1998) and CABG ( Nov. 1998) - She's now having symptoms of unstable angina. She has exertional chest tightness and chest pressure with any sort of exertion. These symptoms started approximately 3 weeks ago. I'm concerned that this represents unstable angina. Her EKG shows a new left bundle-like configuration.  We discussed the risks, benefits, and options. She understands and agrees to proceed.  2. Dyslipidemia -  On Zetia and fenofibrate 145.   Labs were drawn today   3. Diabetes mellitus  5. Right hip arthritis -      Current medicines are reviewed at length with the patient today.  The patient does not have concerns regarding medicines.  The following changes have been made:  no change  Labs/ tests ordered today include:  No orders of the defined types were placed in this encounter.    Disposition:   FU with me in 6 months    Mertie Moores, MD  07/23/2016 2:07 PM    [address] [place], [place], [place]  [address] Phone: [phone]; Fax: [phone]   [facility]  [address] [place], [place]  [address] [phone]    Fax [phone]

## 2016-07-28 ENCOUNTER — Encounter (HOSPITAL_COMMUNITY): Payer: Self-pay | Admitting: Internal Medicine

## 2016-07-28 MED FILL — Verapamil HCl IV Soln 2.5 MG/ML: INTRAVENOUS | Qty: 2 | Status: AC

## 2016-08-04 ENCOUNTER — Encounter: Payer: Self-pay | Admitting: Physician Assistant

## 2016-08-04 ENCOUNTER — Telehealth: Payer: Self-pay | Admitting: *Deleted

## 2016-08-04 ENCOUNTER — Other Ambulatory Visit: Payer: Self-pay | Admitting: Cardiovascular Disease

## 2016-08-04 ENCOUNTER — Ambulatory Visit (INDEPENDENT_AMBULATORY_CARE_PROVIDER_SITE_OTHER): Payer: Medicare Other | Admitting: Physician Assistant

## 2016-08-04 VITALS — BP 100/56 | HR 65 | Ht 62.0 in | Wt 122.8 lb

## 2016-08-04 DIAGNOSIS — I25119 Atherosclerotic heart disease of native coronary artery with unspecified angina pectoris: Secondary | ICD-10-CM

## 2016-08-04 DIAGNOSIS — I10 Essential (primary) hypertension: Secondary | ICD-10-CM | POA: Diagnosis not present

## 2016-08-04 DIAGNOSIS — I493 Ventricular premature depolarization: Secondary | ICD-10-CM

## 2016-08-04 DIAGNOSIS — I5022 Chronic systolic (congestive) heart failure: Secondary | ICD-10-CM

## 2016-08-04 DIAGNOSIS — I420 Dilated cardiomyopathy: Secondary | ICD-10-CM | POA: Diagnosis not present

## 2016-08-04 DIAGNOSIS — I2510 Atherosclerotic heart disease of native coronary artery without angina pectoris: Secondary | ICD-10-CM

## 2016-08-04 DIAGNOSIS — E785 Hyperlipidemia, unspecified: Secondary | ICD-10-CM

## 2016-08-04 DIAGNOSIS — I209 Angina pectoris, unspecified: Secondary | ICD-10-CM

## 2016-08-04 LAB — BASIC METABOLIC PANEL
BUN: 15 mg/dL (ref 7–25)
CALCIUM: 9.8 mg/dL (ref 8.6–10.4)
CO2: 28 mmol/L (ref 20–31)
CREATININE: 0.77 mg/dL (ref 0.60–0.88)
Chloride: 93 mmol/L — ABNORMAL LOW (ref 98–110)
Glucose, Bld: 103 mg/dL — ABNORMAL HIGH (ref 65–99)
Potassium: 4.6 mmol/L (ref 3.5–5.3)
SODIUM: 130 mmol/L — AB (ref 135–146)

## 2016-08-04 MED ORDER — POTASSIUM CHLORIDE ER 10 MEQ PO TBCR: 10.0000 meq | EXTENDED_RELEASE_TABLET | Freq: Every day | ORAL | 3 refills | Status: DC

## 2016-08-04 MED ORDER — FUROSEMIDE 20 MG PO TABS: 20.0000 mg | ORAL_TABLET | Freq: Every day | ORAL | 3 refills | Status: DC

## 2016-08-04 MED ORDER — ISOSORBIDE MONONITRATE ER 30 MG PO TB24: 15.0000 mg | ORAL_TABLET | Freq: Every day | ORAL | 3 refills | Status: DC

## 2016-08-04 NOTE — Telephone Encounter (Signed)
Pt notified of lab results and med changes. Decrease lasix to 20 mg daily; decrease K+ to 10 meq daily, bmet 12/28 when she comes in for monitor.

## 2016-08-04 NOTE — Progress Notes (Signed)
Cardiology Office Note:    Date:  08/04/2016   ID:  [name], DOB 1932-07-15, MRN [id number]  PCP:  Sheela Stack, MD  Cardiologist:  Dr. Liam Rogers   Electrophysiologist:  n/a  Referring MD: Reynold Bowen, MD   Chief Complaint  Patient presents with  . Follow-up    chest pain, s/p cardiac cath    History of Present Illness:    Cynthia [name] is a 82 y.o. female with a hx of CAD s/p PCI in 11/1996 and CABG in 06/1997, HL, DM2.  Last seen by Dr. Liam Rogers 07/23/16.  She complained of chest pain and shortness of breath with exertion.  Cardiac catheterization was arranged.  LHC demonstrated CTO of the LCx, mild to mod non-obstructive CAD involving the ostial LM, mid RCA and the proximal LAD stent was patent with minimal ISR.  S-OM2, S-D2 were patent.  S-RPDA was occluded.  L-LAD was atretic.  The EF was 35-40%.  LVEDP was 16.  Med Rx was recommended.  Isosorbide and furosemide/K+ were added to her medical regimen. She returns for follow up.    She is here alone.  She has only been on the new medication a few days. She has not noticed a difference in her symptoms.  She continues to feel short of breath with rapid walking or increased emotional stress.  She denies any chest pain. She feels "nervous" in her chest.  She denies syncope or near syncope.  She has recently had higher sugars than usual.  Her symptoms do not seem to coincide with hyperglycemia.  She denies orthopnea, PND.  She has mild pedal edema.  She denies melena, hematochezia, nausea, vomiting, diarrhea, coughing or wheezing.   Prior CV studies that were reviewed today include:    LHC 07/27/16 LM ostial 30 LAD ostial 20, mid stent patent with 10 ISR LCx ostial 100 CTO RCA mid 40, distal 30 S-RPDA 100 S-D2 proximal 30 S-OM2 patent L-LAD atretic, 100 occluded EF 35-45  Renal Artery Duplex 6/14 Normal caliber abd Aorta Mod aorto-iliac atherosclerosis without stenosis Normal renal arteries,  bilaterally  Myoview 11/10 Inf-lat infarct, EF 49; no change from 2004  Echo 11/03 Inf basal AK, mod HK of mid inf wall, EF 55-60, severe MAC, mild to mod MR, mod TR   Past Medical History:  Diagnosis Date  . Anxiety   . Arthritis   . Carotid bruit    LEFT  . Coronary artery disease    STATUS POST PCI AND LATER CABG  . Diabetes mellitus   . Dyslipidemia   . Dysrhythmia    hx of palpitations and PVC's   . History of chicken pox   . Hypertension   . Measles    hx of   . Mumps    hx of   . Myocardial infarction   . Palpitations   . PVC's (premature ventricular contractions)     Past Surgical History:  Procedure Laterality Date  . CARDIAC CATHETERIZATION  06/06/97   IT REVEALA MILD TO MODERATE LEFT VENTRICULAR SYSTOLIC DYSFUNCTION  WITH EF OF 40%. THERE IS PERSISTENT AKINESIS OF THE INFERIOR WALL. THERE IS MILD REGURGITATION PRESENT. THERE IS MILD HYPOKINESIS OF THE ANTERIOR  AND APEX WALL  . CARDIAC CATHETERIZATION N/A 07/27/2016   Procedure: Left Heart Cath and Cors/Grafts Angiography;  Surgeon: Nelva Bush, MD;  Location: [facility];  Service: Cardiovascular;  Laterality: N/A;  . CORONARY ARTERY BYPASS GRAFT    . TONSILLECTOMY    .  TOTAL HIP ARTHROPLASTY Right 03/12/2015   Procedure: RIGHT TOTAL HIP ARTHROPLASTY ANTERIOR APPROACH;  Surgeon: Paralee Cancel, MD;  Location: [facility];  Service: Orthopedics;  Laterality: Right;    Current Medications: Current Meds  Medication Sig  . amoxicillin (AMOXIL) 500 MG capsule Take 2,000 mg by mouth See admin instructions. Dor Dental procedures  . aspirin EC 81 MG tablet Take 81 mg by mouth daily.  [name] atorvastatin (LIPITOR) 40 MG tablet Take 1 tablet (40 mg total) by mouth daily.  . B-D INS SYR ULTRAFINE 1CC/31G 31G X 5/16" 1 ML MISC   . calcium-vitamin D (CALCIUM 500+D) 500-400 MG-UNIT per tablet Take 1 tablet by mouth 2 (two) times daily.   . Coenzyme Q10 (COQ10) 200 MG CAPS Take 1 capsule by mouth every evening.   .  ezetimibe (ZETIA) 10 MG tablet Take 1 tablet (10 mg total) by mouth daily.  . fenofibrate (TRICOR) 145 MG tablet Take 1 tablet (145 mg total) by mouth daily.  . furosemide (LASIX) 40 MG tablet Take 1 tablet (40 mg total) by mouth daily.  . Multiple Vitamin (MULTIVITAMIN) tablet Take 1 tablet by mouth every morning.   . nitroGLYCERIN (NITROSTAT) 0.4 MG SL tablet Place 1 tablet (0.4 mg total) under the tongue every 5 (five) minutes as needed for chest pain.  [name] NOVOLOG FLEXPEN 100 UNIT/ML FlexPen Inject 3 Units as directed 3 (three) times daily with meals.  . ONE TOUCH ULTRA TEST test strip 2 (two) times daily. test blood sugar  . ONETOUCH DELICA LANCETS 99991111 MISC CHECK BLOOD SUGARS 2 TIMES A DAY  . Polyethyl Glycol-Propyl Glycol (SYSTANE OP) Apply 1 drop to eye daily as needed (dry scratchy eyes).  . potassium chloride (K-DUR) 10 MEQ tablet Take 2 tablets (20 mEq total) by mouth daily.  . sitaGLIPtin-metformin (JANUMET) 50-1000 MG tablet Take 1 tablet by mouth 2 (two) times daily with a meal.  . TRESIBA FLEXTOUCH 100 UNIT/ML SOPN Inject 10 Units as directed daily.  . valsartan (DIOVAN) 80 MG tablet Take 40 mg by mouth daily.   . vitamin C (ASCORBIC ACID) 500 MG tablet Take 500 mg by mouth every evening.   . [DISCONTINUED] isosorbide mononitrate (IMDUR) 30 MG 24 hr tablet Take 1 tablet (30 mg total) by mouth daily.     Allergies:   Patient has no known allergies.   Social History   Social History  . Marital status: Married    Spouse name: N/A  . Number of children: N/A  . Years of education: N/A   Social History Main Topics  . Smoking status: Never Smoker  . Smokeless tobacco: Never Used  . Alcohol use No  . Drug use: No  . Sexual activity: Not Asked   Other Topics Concern  . None   Social History Narrative  . None     Family History:  The patient's family history includes Cancer in her father; Heart attack in her mother; Stroke in her father.   ROS:   Please see the history  of present illness.    ROS All other systems reviewed and are negative.   EKGs/Labs/Other Test Reviewed:    EKG:  EKG is  ordered today.  The ekg ordered today demonstrates NSR, HR 65, IVCD, inf Q waves, ant Q waves, QTc 472 ms, PACs, similar to prior tracings  Recent Labs: 01/09/2016: ALT 18 07/23/2016: BUN 11; Creat 0.80; Hemoglobin 12.3; Platelets 194; Potassium 4.0; Sodium 136   Recent Lipid Panel    Component Value  Date/Time   CHOL 101 (L) 01/09/2016 0952   TRIG 42 01/09/2016 0952   HDL 40 (L) 01/09/2016 0952   CHOLHDL 2.5 01/09/2016 0952   VLDL 8 01/09/2016 0952   LDLCALC 53 01/09/2016 0952     Physical Exam:    VS:  BP (!) 100/56   Pulse 65   Ht 5\' 2"  (1.575 m)   Wt 122 lb 12.8 oz (55.7 kg)   BMI 22.46 kg/m     Wt Readings from Last 3 Encounters:  08/04/16 122 lb 12.8 oz (55.7 kg)  07/27/16 123 lb (55.8 kg)  07/23/16 123 lb 3.2 oz (55.9 kg)     Physical Exam  Constitutional: She is oriented to person, place, and time. She appears well-developed and well-nourished. No distress.  HENT:  Head: Normocephalic and atraumatic.  Eyes: No scleral icterus.  Neck: No JVD present.  Cardiovascular: Normal rate, regular rhythm and normal heart sounds.   No murmur heard. Pulmonary/Chest: Effort normal. She has no wheezes. She has no rales.  Abdominal: There is no tenderness.  Musculoskeletal: She exhibits no edema.  R groin without hematoma/bruit  Neurological: She is alert and oriented to person, place, and time.  Skin: Skin is warm and dry.  Psychiatric: She has a normal mood and affect.    ASSESSMENT:    1. Coronary artery disease involving native coronary artery of native heart with angina pectoris ([place])   2. Dilated cardiomyopathy ([place])   3. Chronic systolic congestive heart failure ([place])   4. PVC's (premature ventricular contractions)   5. Essential hypertension   6. Dyslipidemia    PLAN:    In order of problems listed above:  1. CAD - s/p PCI to LAD  in 1998 and subsequent CABG.  She recently underwent LHC for exertional chest pain and shortness of breath.  LHC showed essentially severe 1 v CAD with CTO of the LCx and patent S-OM2.  LAD stent was patent and the L-LAD was atretic.  Her EF was lower at 35-45% and her filling pressures were s/w elevated.  She was placed on nitrates and furosemide.  Since then, she denies any change in her symptoms.  Her blood pressure is running lower and she decreased her Diovan on her own to 40 mg QD.  She has a few PVCs vs PACs on her ECG today.  Question if she is symptomatic with these to explain her syndrome.    -  Continue ASA, statin  -  Decrease Imdur to 15 mg QD.  2. Cardiomyopathy - EF now 35-45 by LV-gram.  Her ejection fraction in the past was normal.  She has frequent PVC vs PACs on ECG today.  There was inferior and apical akinesis on her LV-gram which has been documented in the past on echocardiogram.  Question if this is an ischemic CM vs NICM (?related to PVCs).  Her blood pressure has been low and her HR is in the 60s.  At this point, I am not sure she could tolerate a beta-blocker.  Will need to reconsider this over time.  -  Continue angiotensin receptor blocker, nitrates.  -  Arrange echocardiogram  -  Arrange 48 hour Holter to assess PVC burden  -  Consider beta-blocker in the future.  3. Chronic systolic CHF - Volume stable on exam.  She was just started on Lasix and K+ last week.  Obtain BMET today.  Continue angiotensin receptor blocker, nitrates.  Consider beta-blocker over time.  Obtain echocardiogram as noted.  4. PVCs - Her ECG shows PVCs vs PACs.  Question if these are contributing to her symptoms. If she has a large burden of PVCs, this may be contributing to her low EF.  As noted above, arrange echocardiogram and Holter monitor.  She will follow up as planned with Dr. Liam Rogers.    5. HTN - BP running low since she had her LHC.  She reduced her Diovan on her own.  Continue Diovan 40  mg QD.  Decrease Imdur as noted to 15 mg QD.  6. HL - Continue statin.  LDL was 53 in 6/17.    Medication Adjustments/Labs and Tests Ordered: Current medicines are reviewed at length with the patient today.  Concerns regarding medicines are outlined above.  Medication changes, Labs and Tests ordered today are outlined in the Patient Instructions noted below. Patient Instructions  Medication Instructions:  Continue taking the Diovan 40 mg Once daily  DECREASE Isosorbide to 30 mg take 1/2 tablet Once daily (15 mg)  Labwork: Today - BMET  Testing/Procedures: Schedule an echocardiogram  Schedule a 48 Hour Holter Monitor   Follow-Up: Dr. Liam Rogers in January as scheduled.   Any Other Special Instructions Will Be Listed Below (If Applicable).  If you need a refill on your cardiac medications before your next appointment, please call your pharmacy.   Signed, Richardson Dopp, PA-C  08/04/2016 12:35 PM    [facility] [place], [place], [place]  [address] Phone: [phone]; Fax: [phone]

## 2016-08-04 NOTE — Patient Instructions (Signed)
Medication Instructions:  Continue taking the Diovan 40 mg Once daily  DECREASE Isosorbide to 30 mg take 1/2 tablet Once daily (15 mg)  Labwork: Today - BMET  Testing/Procedures: Schedule an echocardiogram  Schedule a 48 Hour Holter Monitor   Follow-Up: Dr. Liam Rogers in January as scheduled.   Any Other Special Instructions Will Be Listed Below (If Applicable).  If you need a refill on your cardiac medications before your next appointment, please call your pharmacy.

## 2016-08-06 ENCOUNTER — Other Ambulatory Visit: Payer: Medicare Other | Admitting: *Deleted

## 2016-08-06 ENCOUNTER — Other Ambulatory Visit: Payer: Self-pay | Admitting: Physician Assistant

## 2016-08-06 ENCOUNTER — Ambulatory Visit (INDEPENDENT_AMBULATORY_CARE_PROVIDER_SITE_OTHER): Payer: Medicare Other

## 2016-08-06 DIAGNOSIS — I10 Essential (primary) hypertension: Secondary | ICD-10-CM

## 2016-08-06 DIAGNOSIS — I420 Dilated cardiomyopathy: Secondary | ICD-10-CM

## 2016-08-06 DIAGNOSIS — I493 Ventricular premature depolarization: Secondary | ICD-10-CM

## 2016-08-06 LAB — BASIC METABOLIC PANEL
BUN: 17 mg/dL (ref 7–25)
CALCIUM: 9.3 mg/dL (ref 8.6–10.4)
CO2: 27 mmol/L (ref 20–31)
CREATININE: 0.64 mg/dL (ref 0.60–0.88)
Chloride: 93 mmol/L — ABNORMAL LOW (ref 98–110)
Glucose, Bld: 141 mg/dL — ABNORMAL HIGH (ref 65–99)
Potassium: 4.3 mmol/L (ref 3.5–5.3)
SODIUM: 127 mmol/L — AB (ref 135–146)

## 2016-08-07 ENCOUNTER — Telehealth: Payer: Self-pay | Admitting: *Deleted

## 2016-08-07 DIAGNOSIS — E871 Hypo-osmolality and hyponatremia: Secondary | ICD-10-CM | POA: Insufficient documentation

## 2016-08-07 NOTE — Telephone Encounter (Signed)
Pt notified of lab results and findings today. Pt advised to stop Lasix, K+ and to limit fluid intake to no more than 50 oz daily due to Na (sodium) level dropped more. BMET 08/13/16, pt said her PCP is in [place] as well so she will just come over to our office for the lab work.

## 2016-08-13 ENCOUNTER — Other Ambulatory Visit: Payer: Medicare Other

## 2016-08-17 ENCOUNTER — Other Ambulatory Visit: Payer: Medicare Other

## 2016-08-18 ENCOUNTER — Ambulatory Visit (HOSPITAL_COMMUNITY): Payer: Medicare Other | Attending: Internal Medicine

## 2016-08-18 ENCOUNTER — Other Ambulatory Visit: Payer: Medicare Other | Admitting: *Deleted

## 2016-08-18 ENCOUNTER — Other Ambulatory Visit: Payer: Self-pay

## 2016-08-18 DIAGNOSIS — I361 Nonrheumatic tricuspid (valve) insufficiency: Secondary | ICD-10-CM | POA: Insufficient documentation

## 2016-08-18 DIAGNOSIS — R9439 Abnormal result of other cardiovascular function study: Secondary | ICD-10-CM | POA: Diagnosis not present

## 2016-08-18 DIAGNOSIS — I493 Ventricular premature depolarization: Secondary | ICD-10-CM

## 2016-08-18 DIAGNOSIS — I501 Left ventricular failure: Secondary | ICD-10-CM | POA: Diagnosis not present

## 2016-08-18 DIAGNOSIS — I340 Nonrheumatic mitral (valve) insufficiency: Secondary | ICD-10-CM | POA: Diagnosis not present

## 2016-08-18 DIAGNOSIS — I420 Dilated cardiomyopathy: Secondary | ICD-10-CM | POA: Diagnosis not present

## 2016-08-18 DIAGNOSIS — I10 Essential (primary) hypertension: Secondary | ICD-10-CM

## 2016-08-18 DIAGNOSIS — I2510 Atherosclerotic heart disease of native coronary artery without angina pectoris: Secondary | ICD-10-CM | POA: Diagnosis not present

## 2016-08-19 ENCOUNTER — Encounter: Payer: Self-pay | Admitting: Physician Assistant

## 2016-08-19 LAB — BASIC METABOLIC PANEL
BUN / CREAT RATIO: 25 (ref 12–28)
BUN: 16 mg/dL (ref 8–27)
CO2: 23 mmol/L (ref 18–29)
Calcium: 9.5 mg/dL (ref 8.7–10.3)
Chloride: 100 mmol/L (ref 96–106)
Creatinine, Ser: 0.63 mg/dL (ref 0.57–1.00)
GFR, EST AFRICAN AMERICAN: 97 mL/min/{1.73_m2} (ref >59)
GFR, EST NON AFRICAN AMERICAN: 84 mL/min/{1.73_m2} (ref >59)
Glucose: 149 mg/dL — ABNORMAL HIGH (ref 65–99)
POTASSIUM: 4.5 mmol/L (ref 3.5–5.2)
SODIUM: 139 mmol/L (ref 134–144)

## 2016-08-20 ENCOUNTER — Telehealth: Payer: Self-pay | Admitting: Cardiovascular Disease

## 2016-08-20 DIAGNOSIS — Z6822 Body mass index (BMI) 22.0-22.9, adult: Secondary | ICD-10-CM | POA: Diagnosis not present

## 2016-08-20 DIAGNOSIS — I10 Essential (primary) hypertension: Secondary | ICD-10-CM | POA: Diagnosis not present

## 2016-08-20 DIAGNOSIS — E1140 Type 2 diabetes mellitus with diabetic neuropathy, unspecified: Secondary | ICD-10-CM | POA: Diagnosis not present

## 2016-08-20 NOTE — Telephone Encounter (Signed)
Patient called back with echo and lab results. Message routed to PCP.

## 2016-08-20 NOTE — Telephone Encounter (Signed)
-----   Message from Liliane Shi, [place] sent at 08/19/2016  5:09 PM EST ----- Please call the patient The echocardiogram shows that her heart function (ejection fraction) is actually normal (this is better than what was seen at the heart cath - the echocardiogram is more accurate for this measurement). There is mildly impaired relaxation of the left ventricle.   There is mild leakage of the mitral and tricuspid valves and the lung pressures (PASP) are mildly elevated (this is likely related to age).   Continue current treatment plan and keep planned follow up.   Please route a copy of this study result to her PCP:  Sheela Stack, MD  Richardson Dopp, PA-C    08/19/2016 4:59 PM

## 2016-08-20 NOTE — Telephone Encounter (Signed)
New Message  Pt voiced returning nurses call and for nurse to call her on her call.  Please f/u

## 2016-09-03 ENCOUNTER — Ambulatory Visit (INDEPENDENT_AMBULATORY_CARE_PROVIDER_SITE_OTHER): Payer: Medicare Other | Admitting: Cardiovascular Disease

## 2016-09-03 ENCOUNTER — Other Ambulatory Visit: Payer: Self-pay | Admitting: Cardiovascular Disease

## 2016-09-03 ENCOUNTER — Encounter: Payer: Self-pay | Admitting: Cardiovascular Disease

## 2016-09-03 VITALS — BP 146/64 | HR 76 | Ht 62.0 in | Wt 124.1 lb

## 2016-09-03 DIAGNOSIS — I2510 Atherosclerotic heart disease of native coronary artery without angina pectoris: Secondary | ICD-10-CM | POA: Diagnosis not present

## 2016-09-03 NOTE — Patient Instructions (Signed)

## 2016-09-03 NOTE — Progress Notes (Signed)
Cardiology Office Note   Date:  09/03/2016   ID:  [name], DOB 09/02/1931, MRN [id number]  PCP:  Sheela Stack, MD  Cardiologist:   Mertie Moores, MD   Chief Complaint  Patient presents with  . Coronary Artery Disease  . Hyperlipidemia   1. Coronary artery disease-status post PCI( April , 1998) and CABG ( Nov. 1998) 2. Dyslipidemia 3. Diabetes mellitus 4. Left carotid bruit 5. Right hip arthritis   History of Present Illness: Cynthia [name] is a 83 y.o. y.o. female with a history of coronary artery disease. She status post PCI and then later had coronary artery bypass grafting. She also has a history of dyslipidemia, diabetes mellitus, and a left carotid bruit.  She has done well from a cardiac standpoint. She has not been exercising as much as she has in the past. She complains of generalized fatigue. She stays busy doing chores. 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  January 16, 2013:  Cynthia [name] is dong well. She is getting over several viral infections over the past few months. No CP.   Dec. 22,2014:  Cynthia [name] has had a rough summer. Also has had a cold. Her husband has been at [facility] since early Sept. No cardiac issues.   January 08, 2014:  Cynthia [name] is doing ok. She has lost lots of weight over the past year or so and now is gaining some weight back. Her has an abdominal bruit. Renal artery duplex study was normal.   She has started insulin therapy.   Dec. 1, 2015:  Cynthia [name] is an 83 yo who I follow for CAD, dyslipidema.  She has had some BP variablility.  No CP,  Has not been exercising. Has hip / back problems.  Her husband passed away this past 01/22/23.    January 11, 2015:  Cynthia [name] is a 83 y.o. female who presents for  Follow up of her CAD and HTN. Having lots of pain this am - as a result , her BP is high  May need to have hip surgery    Dec. 2, 2016:  Doing well.  No cardiac issues. Had her R hip replacement.   [address] and broke her left arm while using her walker.   Bp and HR are well controlled.    January 09, 2016: Doing well.  No CP or dyspnea. Does not have the endurance that she used to have   Dec. 14, 2017  Having more DOE and chest pressure with exertion. Similar to prior to her CABG  Has not tried a SL NTG  Her symptoms started 3 weeks ago. Now she cannot do any exercise before she gets some chest tightness.   Jan. 25 2018: She presented to the office with angina in mid December. She was sent to the hospital and had a heart catheterization. She was found to have an ostial occlusion of the left circumflex artery. The LIMA to LAD was patent but the IMA was atretic.[name] She has a proximal/mid LAD stent that is patent. There is a patent saphenous vein graft to the obtuse marginal artery and a patent saphenous vein graft to the second diagonal. The saphenous vein graft to the right coronary artery is occluded.  She had moderate left ventricle systolic function by cath  with an EF of 35-40%. She had inferior apical akinesis.  Echocardiogram revealed normal left ventricle systolic function. She has mild tricuspid regurgitation and mild mitral regurgitation.  She  seems to be feeling better.    No further episodes of CP or dyspnea.     Past Medical History:  Diagnosis Date  . Anxiety   . Arthritis   . Carotid bruit    LEFT  . Coronary artery disease    STATUS POST PCI AND LATER CABG  . Diabetes mellitus   . Dyslipidemia   . Dysrhythmia    hx of palpitations and PVC's   . History of chicken pox   . History of echocardiogram    Echo 08-31-22: mild LVH, EF 55-60, no RWMA, Gr 1 DD, MAC, mild MR, mild LAE, mild TR, PASP 31  . Hypertension   . Measles    hx of   . Mumps    hx of   . Myocardial infarction   . Palpitations   . PVC's (premature ventricular contractions)     Past Surgical History:  Procedure Laterality Date  . CARDIAC CATHETERIZATION  06/06/97   IT REVEALA MILD TO MODERATE  LEFT VENTRICULAR SYSTOLIC DYSFUNCTION  WITH EF OF 40%. THERE IS PERSISTENT AKINESIS OF THE INFERIOR WALL. THERE IS MILD REGURGITATION PRESENT. THERE IS MILD HYPOKINESIS OF THE ANTERIOR  AND APEX WALL  . CARDIAC CATHETERIZATION N/A 07/27/2016   Procedure: Left Heart Cath and Cors/Grafts Angiography;  Surgeon: Nelva Bush, MD;  Location: [facility];  Service: Cardiovascular;  Laterality: N/A;  . CORONARY ARTERY BYPASS GRAFT    . TONSILLECTOMY    . TOTAL HIP ARTHROPLASTY Right 03/12/2015   Procedure: RIGHT TOTAL HIP ARTHROPLASTY ANTERIOR APPROACH;  Surgeon: Paralee Cancel, MD;  Location: [facility];  Service: Orthopedics;  Laterality: Right;     Current Outpatient Prescriptions  Medication Sig Dispense Refill  . amoxicillin (AMOXIL) 500 MG capsule Take 2,000 mg by mouth See admin instructions. Dor Dental procedures    . aspirin EC 81 MG tablet Take 81 mg by mouth daily.    [name] atorvastatin (LIPITOR) 40 MG tablet Take 1 tablet (40 mg total) by mouth daily. 90 tablet 2  . B-D INS SYR ULTRAFINE 1CC/31G 31G X 5/16" 1 ML MISC     . calcium-vitamin D (CALCIUM 500+D) 500-400 MG-UNIT per tablet Take 1 tablet by mouth 2 (two) times daily.     . Coenzyme Q10 (COQ10) 200 MG CAPS Take 1 capsule by mouth every evening.     . ezetimibe (ZETIA) 10 MG tablet Take 1 tablet (10 mg total) by mouth daily. 90 tablet 2  . fenofibrate (TRICOR) 145 MG tablet TAKE 1 TABLET DAILY 90 tablet 1  . isosorbide mononitrate (IMDUR) 30 MG 24 hr tablet Take 0.5 tablets (15 mg total) by mouth daily. 90 tablet 3  . Multiple Vitamin (MULTIVITAMIN) tablet Take 1 tablet by mouth every morning.     . nitroGLYCERIN (NITROSTAT) 0.4 MG SL tablet Place 1 tablet (0.4 mg total) under the tongue every 5 (five) minutes as needed for chest pain. 25 tablet 6  . NOVOLOG FLEXPEN 100 UNIT/ML FlexPen Inject 3 Units as directed 3 (three) times daily with meals.  4  . ONE TOUCH ULTRA TEST test strip 2 (two) times daily. test blood sugar  5  .  ONETOUCH DELICA LANCETS 65H MISC CHECK BLOOD SUGARS 2 TIMES A DAY  5  . Polyethyl Glycol-Propyl Glycol (SYSTANE OP) Apply 1 drop to eye daily as needed (dry scratchy eyes).    . sitaGLIPtin-metformin (JANUMET) 50-1000 MG tablet Take 1 tablet by mouth 2 (two) times daily with a meal.    .  TRESIBA FLEXTOUCH 100 UNIT/ML SOPN Inject 10 Units as directed daily.  4  . valsartan (DIOVAN) 80 MG tablet Take 40 mg by mouth daily.   6  . vitamin C (ASCORBIC ACID) 500 MG tablet Take 500 mg by mouth every evening.      No current facility-administered medications for this visit.     Allergies:   Patient has no known allergies.    Social History:  The patient  reports that she has never smoked. She has never used smokeless tobacco. She reports that she does not drink alcohol or use drugs.   Family History:  The patient's family history includes Cancer in her father; Heart attack in her mother; Stroke in her father.    ROS:  Please see the history of present illness.    Review of Systems: Constitutional:  denies fever, chills, diaphoresis, appetite change and fatigue.  HEENT: denies photophobia, eye pain, redness, hearing loss, ear pain, congestion, sore throat, rhinorrhea, sneezing, neck pain, neck stiffness and tinnitus.  Respiratory: denies SOB, DOE, cough, chest tightness, and wheezing.  Cardiovascular: admits to chest pain,   Gastrointestinal: denies nausea, vomiting, abdominal pain, diarrhea, constipation, blood in stool.  Genitourinary: denies dysuria, urgency, frequency, hematuria, flank pain and difficulty urinating.  Musculoskeletal: denies  myalgias, back pain, joint swelling, arthralgias and gait problem.   Skin: denies pallor, rash and wound.  Neurological: denies dizziness, seizures, syncope, weakness, light-headedness, numbness and headaches.   Hematological: denies adenopathy, easy bruising, personal or family bleeding history.  Psychiatric/ Behavioral: denies suicidal ideation,  mood changes, confusion, nervousness, sleep disturbance and agitation.       All other systems are reviewed and negative.    PHYSICAL EXAM: VS:  BP (!) 146/64 (BP Location: Right Arm, Patient Position: Sitting, Cuff Size: Normal)   Pulse 76   Ht _0  (1.575 m)   Wt 124 lb 1.9 oz (56.3 kg)   SpO2 99%   BMI 22.70 kg/m  , BMI Body mass index is 22.7 kg/m. GEN: Well nourished, well developed, in no acute distress  HEENT: normal  Neck: no JVD, carotid bruits, or masses Cardiac: RRR; no murmurs, rubs, or gallops,no edema  Respiratory:  clear to auscultation bilaterally, normal work of breathing GI: soft, nontender, nondistended, + BS MS: no deformity or atrophy  Skin: warm and dry, no rash Neuro:  Strength and sensation are intact Psych: normal   EKG:  EKG is ordered today. The ekg ordered today demonstrates : NSR at 77.  NS IVCD.     Recent Labs: 01/09/2016: ALT 18 07/23/2016: Hemoglobin 12.3; Platelets 194 08/18/2016: BUN 16; Creatinine, Ser 0.63; Potassium 4.5; Sodium 139    Lipid Panel    Component Value Date/Time   CHOL 101 (L) 01/09/2016 0952   TRIG 42 01/09/2016 0952   HDL 40 (L) 01/09/2016 0952   CHOLHDL 2.5 01/09/2016 0952   VLDL 8 01/09/2016 0952   LDLCALC 53 01/09/2016 0952      Wt Readings from Last 3 Encounters:  09/03/16 124 lb 1.9 oz (56.3 kg)  08/04/16 122 lb 12.8 oz (55.7 kg)  07/27/16 123 lb (55.8 kg)      Other studies Reviewed: Additional studies/ records that were reviewed today include: . Review of the above records demonstrates:    ASSESSMENT AND PLAN:  1. Coronary artery disease-status post PCI( April , 1998) and CABG ( Nov. 1998) -  Her symptoms seem to be better. She has an occluded saphenous vein graft to the right coronary  artery. Her LIMA is atretic.    2. Dyslipidemia -  On Zetia and fenofibrate 145.   Labs were drawn today   3. Diabetes mellitus  5. Right hip arthritis -      Current medicines are reviewed at length  with the patient today.  The patient does not have concerns regarding medicines.  The following changes have been made:  no change  Labs/ tests ordered today include:  No orders of the defined types were placed in this encounter.    Disposition:   FU with me in 6 months    Mertie Moores, MD  09/03/2016 2:38 PM    [address], [place], [place]  [address] Phone: [phone]; Fax: [phone]

## 2016-09-17 ENCOUNTER — Other Ambulatory Visit: Payer: Self-pay | Admitting: Cardiovascular Disease

## 2016-09-17 DIAGNOSIS — E785 Hyperlipidemia, unspecified: Secondary | ICD-10-CM

## 2016-11-18 DIAGNOSIS — I2510 Atherosclerotic heart disease of native coronary artery without angina pectoris: Secondary | ICD-10-CM | POA: Diagnosis not present

## 2016-11-18 DIAGNOSIS — E1142 Type 2 diabetes mellitus with diabetic polyneuropathy: Secondary | ICD-10-CM | POA: Diagnosis not present

## 2016-11-18 DIAGNOSIS — D500 Iron deficiency anemia secondary to blood loss (chronic): Secondary | ICD-10-CM | POA: Diagnosis not present

## 2016-11-18 DIAGNOSIS — I10 Essential (primary) hypertension: Secondary | ICD-10-CM | POA: Diagnosis not present

## 2016-11-18 DIAGNOSIS — Z1389 Encounter for screening for other disorder: Secondary | ICD-10-CM | POA: Diagnosis not present

## 2016-11-18 DIAGNOSIS — D696 Thrombocytopenia, unspecified: Secondary | ICD-10-CM | POA: Diagnosis not present

## 2016-11-18 DIAGNOSIS — I6523 Occlusion and stenosis of bilateral carotid arteries: Secondary | ICD-10-CM | POA: Diagnosis not present

## 2016-11-18 DIAGNOSIS — E1140 Type 2 diabetes mellitus with diabetic neuropathy, unspecified: Secondary | ICD-10-CM | POA: Diagnosis not present

## 2016-11-18 DIAGNOSIS — I351 Nonrheumatic aortic (valve) insufficiency: Secondary | ICD-10-CM | POA: Diagnosis not present

## 2016-11-18 DIAGNOSIS — M810 Age-related osteoporosis without current pathological fracture: Secondary | ICD-10-CM | POA: Diagnosis not present

## 2016-11-18 DIAGNOSIS — E042 Nontoxic multinodular goiter: Secondary | ICD-10-CM | POA: Diagnosis not present

## 2016-11-18 DIAGNOSIS — E871 Hypo-osmolality and hyponatremia: Secondary | ICD-10-CM | POA: Diagnosis not present

## 2016-11-24 DIAGNOSIS — H524 Presbyopia: Secondary | ICD-10-CM | POA: Diagnosis not present

## 2016-11-24 DIAGNOSIS — H2513 Age-related nuclear cataract, bilateral: Secondary | ICD-10-CM | POA: Diagnosis not present

## 2016-12-16 ENCOUNTER — Other Ambulatory Visit: Payer: Self-pay | Admitting: Cardiovascular Disease

## 2016-12-16 DIAGNOSIS — E785 Hyperlipidemia, unspecified: Secondary | ICD-10-CM

## 2016-12-25 DIAGNOSIS — H2513 Age-related nuclear cataract, bilateral: Secondary | ICD-10-CM | POA: Diagnosis not present

## 2017-01-12 DIAGNOSIS — E1140 Type 2 diabetes mellitus with diabetic neuropathy, unspecified: Secondary | ICD-10-CM | POA: Diagnosis not present

## 2017-01-12 DIAGNOSIS — Z6822 Body mass index (BMI) 22.0-22.9, adult: Secondary | ICD-10-CM | POA: Diagnosis not present

## 2017-01-12 DIAGNOSIS — I10 Essential (primary) hypertension: Secondary | ICD-10-CM | POA: Diagnosis not present

## 2017-01-25 ENCOUNTER — Other Ambulatory Visit: Payer: Self-pay | Admitting: Cardiovascular Disease

## 2017-03-24 ENCOUNTER — Other Ambulatory Visit: Payer: Medicare Other | Admitting: *Deleted

## 2017-03-24 DIAGNOSIS — I2510 Atherosclerotic heart disease of native coronary artery without angina pectoris: Secondary | ICD-10-CM | POA: Diagnosis not present

## 2017-03-24 LAB — LIPID PANEL
CHOL/HDL RATIO: 2.8 ratio (ref 0.0–4.4)
Cholesterol, Total: 97 mg/dL — ABNORMAL LOW (ref 100–199)
HDL: 35 mg/dL — AB (ref >39)
LDL Calculated: 54 mg/dL (ref 0–99)
TRIGLYCERIDES: 39 mg/dL (ref 0–149)
VLDL Cholesterol Cal: 8 mg/dL (ref 5–40)

## 2017-03-24 LAB — COMPREHENSIVE METABOLIC PANEL
A/G RATIO: 2.1 (ref 1.2–2.2)
ALT: 19 IU/L (ref 0–32)
AST: 32 IU/L (ref 0–40)
Albumin: 4.2 g/dL (ref 3.5–4.7)
Alkaline Phosphatase: 45 IU/L (ref 39–117)
BUN / CREAT RATIO: 14 (ref 12–28)
BUN: 9 mg/dL (ref 8–27)
Bilirubin Total: 0.5 mg/dL (ref 0.0–1.2)
CHLORIDE: 100 mmol/L (ref 96–106)
CO2: 22 mmol/L (ref 20–29)
Calcium: 9.4 mg/dL (ref 8.7–10.3)
Creatinine, Ser: 0.66 mg/dL (ref 0.57–1.00)
GFR calc non Af Amer: 82 mL/min/{1.73_m2} (ref >59)
GFR, EST AFRICAN AMERICAN: 94 mL/min/{1.73_m2} (ref >59)
GLOBULIN, TOTAL: 2 g/dL (ref 1.5–4.5)
Glucose: 126 mg/dL — ABNORMAL HIGH (ref 65–99)
POTASSIUM: 4.3 mmol/L (ref 3.5–5.2)
SODIUM: 136 mmol/L (ref 134–144)
TOTAL PROTEIN: 6.2 g/dL (ref 6.0–8.5)

## 2017-03-25 ENCOUNTER — Telehealth: Payer: Self-pay

## 2017-03-25 NOTE — Telephone Encounter (Signed)
-----   Message from Thayer Headings, MD sent at 03/25/2017 10:10 AM EDT ----- Labs are stable

## 2017-03-25 NOTE — Telephone Encounter (Signed)
Spoke with patient about recent lab results. Patient verbalized understanding and agreed to continue with current treatment plan.

## 2017-03-26 ENCOUNTER — Ambulatory Visit (INDEPENDENT_AMBULATORY_CARE_PROVIDER_SITE_OTHER): Payer: Medicare Other | Admitting: Cardiovascular Disease

## 2017-03-26 ENCOUNTER — Encounter: Payer: Self-pay | Admitting: Cardiovascular Disease

## 2017-03-26 VITALS — BP 136/60 | HR 60 | Ht 62.0 in | Wt 125.4 lb

## 2017-03-26 DIAGNOSIS — E782 Mixed hyperlipidemia: Secondary | ICD-10-CM | POA: Diagnosis not present

## 2017-03-26 DIAGNOSIS — I2510 Atherosclerotic heart disease of native coronary artery without angina pectoris: Secondary | ICD-10-CM

## 2017-03-26 NOTE — Patient Instructions (Signed)

## 2017-03-26 NOTE — Progress Notes (Signed)
Cardiology Office Note   Date:  03/26/2017   ID:  Cynthia [name], DOB 25-Feb-1932, MRN [id number]  PCP:  Reynold Bowen, MD  Cardiologist:   Mertie Moores, MD   Chief Complaint  Patient presents with  . Coronary Artery Disease   1. Coronary artery disease-status post PCI( April , 1998) and CABG ( Nov. 1998) 2. Dyslipidemia 3. Diabetes mellitus 4. Left carotid bruit 5. Right hip arthritis   History of Present Illness: Cynthia [name] is a 83 y.o. y.o. female with a history of coronary artery disease. She status post PCI and then later had coronary artery bypass grafting. She also has a history of dyslipidemia, diabetes mellitus, and a left carotid bruit.  She has done well from a cardiac standpoint. She has not been exercising as much as she has in the past. She complains of generalized fatigue. She stays busy doing chores. 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  January 16, 2013:  Cynthia [name] is dong well. She is getting over several viral infections over the past few months. No CP.   Dec. 22,2014:  Cynthia [name] has had a rough summer. Also has had a cold. Her husband has been at [facility] since early Sept. No cardiac issues.   January 08, 2014:  Cynthia [name] is doing ok. She has lost lots of weight over the past year or so and now is gaining some weight back. Her has an abdominal bruit. Renal artery duplex study was normal.   She has started insulin therapy.   Dec. 1, 2015:  Cynthia [name] is an 83 yo who I follow for CAD, dyslipidema.  She has had some BP variablility.  No CP,  Has not been exercising. Has hip / back problems.  Her husband passed away this past 02-04-2023.    January 11, 2015:  Cynthia [name] is a 83 y.o. female who presents for  Follow up of her CAD and HTN. Having lots of pain this am - as a result , her BP is high  May need to have hip surgery    Dec. 2, 2016:  Doing well.  No cardiac issues. Had her R hip replacement.  [address] and broke her  left arm while using her walker.   Bp and HR are well controlled.    January 09, 2016: Doing well.  No CP or dyspnea. Does not have the endurance that she used to have   Dec. 14, 2017  Having more DOE and chest pressure with exertion. Similar to prior to her CABG  Has not tried a SL NTG  Her symptoms started 3 weeks ago. Now she cannot do any exercise before she gets some chest tightness.   Jan. 25 2018: She presented to the office with angina in mid December. She was sent to the hospital and had a heart catheterization. She was found to have an ostial occlusion of the left circumflex artery. The LIMA to LAD was patent but the IMA was atretic.[name] She has a proximal/mid LAD stent that is patent. There is a patent saphenous vein graft to the obtuse marginal artery and a patent saphenous vein graft to the second diagonal. The saphenous vein graft to the right coronary artery is occluded.  She had moderate left ventricle systolic function by cath  with an EF of 35-40%. She had inferior apical akinesis.  Echocardiogram revealed normal left ventricle systolic function. She has mild tricuspid regurgitation and mild mitral regurgitation.  She seems to be  feeling better.    No further episodes of CP or dyspnea.    Aug. 17, 2018:   . Has some rare episodes of chest tightness ( especially when carrying heavy objects )   Past Medical History:  Diagnosis Date  . Anxiety   . Arthritis   . Carotid bruit    LEFT  . Coronary artery disease    STATUS POST PCI AND LATER CABG  . Diabetes mellitus   . Dyslipidemia   . Dysrhythmia    hx of palpitations and PVC's   . History of chicken pox   . History of echocardiogram    Echo 09/04/2022: mild LVH, EF 55-60, no RWMA, Gr 1 DD, MAC, mild MR, mild LAE, mild TR, PASP 31  . Hypertension   . Measles    hx of   . Mumps    hx of   . Myocardial infarction ([place])   . Palpitations   . PVC's (premature ventricular contractions)     Past Surgical History:    Procedure Laterality Date  . CARDIAC CATHETERIZATION  06/06/97   IT REVEALA MILD TO MODERATE LEFT VENTRICULAR SYSTOLIC DYSFUNCTION  WITH EF OF 40%. THERE IS PERSISTENT AKINESIS OF THE INFERIOR WALL. THERE IS MILD REGURGITATION PRESENT. THERE IS MILD HYPOKINESIS OF THE ANTERIOR  AND APEX WALL  . CARDIAC CATHETERIZATION N/A 07/27/2016   Procedure: Left Heart Cath and Cors/Grafts Angiography;  Surgeon: Nelva Bush, MD;  Location: [facility];  Service: Cardiovascular;  Laterality: N/A;  . CORONARY ARTERY BYPASS GRAFT    . TONSILLECTOMY    . TOTAL HIP ARTHROPLASTY Right 03/12/2015   Procedure: RIGHT TOTAL HIP ARTHROPLASTY ANTERIOR APPROACH;  Surgeon: Paralee Cancel, MD;  Location: [facility];  Service: Orthopedics;  Laterality: Right;     Current Outpatient Prescriptions  Medication Sig Dispense Refill  . amoxicillin (AMOXIL) 500 MG capsule Take 2,000 mg by mouth See admin instructions. Dor Dental procedures    . aspirin EC 81 MG tablet Take 81 mg by mouth daily.    [name] atorvastatin (LIPITOR) 40 MG tablet Take 1 tablet (40 mg total) by mouth daily. 90 tablet 2  . B-D INS SYR ULTRAFINE 1CC/31G 31G X 5/16" 1 ML MISC     . calcium-vitamin D (CALCIUM 500+D) 500-400 MG-UNIT per tablet Take 1 tablet by mouth 2 (two) times daily.     . Coenzyme Q10 (COQ10) 200 MG CAPS Take 1 capsule by mouth every evening.     . ezetimibe (ZETIA) 10 MG tablet Take 1 tablet (10 mg total) by mouth daily. 90 tablet 2  . fenofibrate (TRICOR) 145 MG tablet TAKE 1 TABLET DAILY 90 tablet 2  . losartan (COZAAR) 25 MG tablet Take 25 mg by mouth every morning.    . Multiple Vitamin (MULTIVITAMIN) tablet Take 1 tablet by mouth every morning.     [name] NOVOLOG FLEXPEN 100 UNIT/ML FlexPen Inject 3 Units as directed 3 (three) times daily with meals.  4  . ONE TOUCH ULTRA TEST test strip 2 (two) times daily. test blood sugar  5  . ONETOUCH DELICA LANCETS 62X MISC CHECK BLOOD SUGARS 2 TIMES A DAY  5  . Polyethyl Glycol-Propyl Glycol  (SYSTANE OP) Apply 1 drop to eye daily as needed (dry scratchy eyes).    . sitaGLIPtin-metformin (JANUMET) 50-1000 MG tablet Take 1 tablet by mouth 2 (two) times daily with a meal.    . TRESIBA FLEXTOUCH 100 UNIT/ML SOPN Inject 10 Units as directed daily.  4  .  vitamin C (ASCORBIC ACID) 500 MG tablet Take 500 mg by mouth every evening.     . isosorbide mononitrate (IMDUR) 30 MG 24 hr tablet Take 0.5 tablets (15 mg total) by mouth daily. 90 tablet 3  . nitroGLYCERIN (NITROSTAT) 0.4 MG SL tablet Place 1 tablet (0.4 mg total) under the tongue every 5 (five) minutes as needed for chest pain. 25 tablet 6   No current facility-administered medications for this visit.     Allergies:   Patient has no known allergies.    Social History:  The patient  reports that she has never smoked. She has never used smokeless tobacco. She reports that she does not drink alcohol or use drugs.   Family History:  The patient's family history includes Cancer in her father; Heart attack in her mother; Stroke in her father.    ROS:  Please see the history of present illness.    Review of Systems: Constitutional:  denies fever, chills, diaphoresis, appetite change and fatigue.  HEENT: denies photophobia, eye pain, redness, hearing loss, ear pain, congestion, sore throat, rhinorrhea, sneezing, neck pain, neck stiffness and tinnitus.  Respiratory: denies SOB, DOE, cough, chest tightness, and wheezing.  Cardiovascular: admits to chest pain,   Gastrointestinal: denies nausea, vomiting, abdominal pain, diarrhea, constipation, blood in stool.  Genitourinary: denies dysuria, urgency, frequency, hematuria, flank pain and difficulty urinating.  Musculoskeletal: denies  myalgias, back pain, joint swelling, arthralgias and gait problem.   Skin: denies pallor, rash and wound.  Neurological: denies dizziness, seizures, syncope, weakness, light-headedness, numbness and headaches.   Hematological: denies adenopathy, easy  bruising, personal or family bleeding history.  Psychiatric/ Behavioral: denies suicidal ideation, mood changes, confusion, nervousness, sleep disturbance and agitation.       All other systems are reviewed and negative.    PHYSICAL EXAM: VS:  BP 136/60   Pulse 60   Ht _0  (1.575 m)   Wt 125 lb 6.4 oz (56.9 kg)   SpO2 98%   BMI 22.94 kg/m  , BMI Body mass index is 22.94 kg/m. GEN: Well nourished, well developed, in no acute distress  HEENT: normal  Neck: no JVD, carotid bruits, or masses Cardiac: RRR; no murmurs, rubs, or gallops,no edema  Respiratory:  clear to auscultation bilaterally, normal work of breathing GI: soft, nontender, nondistended, + BS -  2+ abdominal bruit  MS: no deformity or atrophy  Skin: warm and dry, no rash Neuro:  Strength and sensation are intact Psych: normal   EKG:  EKG is ordered today. The ekg ordered today demonstrates : NSR at 77.  NS IVCD.     Recent Labs: 07/23/2016: Hemoglobin 12.3; Platelets 194 03/24/2017: ALT 19; BUN 9; Creatinine, Ser 0.66; Potassium 4.3; Sodium 136    Lipid Panel    Component Value Date/Time   CHOL 97 (L) 03/24/2017 0742   TRIG 39 03/24/2017 0742   HDL 35 (L) 03/24/2017 0742   CHOLHDL 2.8 03/24/2017 0742   CHOLHDL 2.5 01/09/2016 0952   VLDL 8 01/09/2016 0952   LDLCALC 54 03/24/2017 0742      Wt Readings from Last 3 Encounters:  03/26/17 125 lb 6.4 oz (56.9 kg)  09/03/16 124 lb 1.9 oz (56.3 kg)  08/04/16 122 lb 12.8 oz (55.7 kg)      Other studies Reviewed: Additional studies/ records that were reviewed today include: . Review of the above records demonstrates:    ASSESSMENT AND PLAN:  1. Coronary artery disease-status post PCI( April , 1998) and CABG (  Nov. 1998) -  Her symptoms seem to be better. She has an occluded saphenous vein graft to the right coronary artery. Her LIMA is atretic.    2. Dyslipidemia -  On Zetia and fenofibrate 145.     3. Abdominal Bruit:   Renal artery duplex in  2014 showed no renal artery stenosis Creatinine is normal Will consider repeating in the future    3. Diabetes mellitus  5. Right hip arthritis -      Current medicines are reviewed at length with the patient today.  The patient does not have concerns regarding medicines.  The following changes have been made:  no change  Labs/ tests ordered today include:  No orders of the defined types were placed in this encounter.    Disposition:   FU with me in 6 months    Mertie Moores, MD  03/26/2017 10:07 AM    [address], [place], [place]  [address] Phone: [phone]; Fax: [phone]

## 2017-04-07 DIAGNOSIS — D500 Iron deficiency anemia secondary to blood loss (chronic): Secondary | ICD-10-CM | POA: Diagnosis not present

## 2017-04-07 DIAGNOSIS — Z6822 Body mass index (BMI) 22.0-22.9, adult: Secondary | ICD-10-CM | POA: Diagnosis not present

## 2017-04-07 DIAGNOSIS — Z1389 Encounter for screening for other disorder: Secondary | ICD-10-CM | POA: Diagnosis not present

## 2017-04-07 DIAGNOSIS — E559 Vitamin D deficiency, unspecified: Secondary | ICD-10-CM | POA: Diagnosis not present

## 2017-04-07 DIAGNOSIS — I2510 Atherosclerotic heart disease of native coronary artery without angina pectoris: Secondary | ICD-10-CM | POA: Diagnosis not present

## 2017-04-07 DIAGNOSIS — I10 Essential (primary) hypertension: Secondary | ICD-10-CM | POA: Diagnosis not present

## 2017-04-07 DIAGNOSIS — D696 Thrombocytopenia, unspecified: Secondary | ICD-10-CM | POA: Diagnosis not present

## 2017-04-07 DIAGNOSIS — E1142 Type 2 diabetes mellitus with diabetic polyneuropathy: Secondary | ICD-10-CM | POA: Diagnosis not present

## 2017-04-07 DIAGNOSIS — E871 Hypo-osmolality and hyponatremia: Secondary | ICD-10-CM | POA: Diagnosis not present

## 2017-04-07 DIAGNOSIS — E784 Other hyperlipidemia: Secondary | ICD-10-CM | POA: Diagnosis not present

## 2017-04-07 DIAGNOSIS — E1140 Type 2 diabetes mellitus with diabetic neuropathy, unspecified: Secondary | ICD-10-CM | POA: Diagnosis not present

## 2017-04-22 DIAGNOSIS — H25811 Combined forms of age-related cataract, right eye: Secondary | ICD-10-CM | POA: Diagnosis not present

## 2017-04-22 DIAGNOSIS — H2511 Age-related nuclear cataract, right eye: Secondary | ICD-10-CM | POA: Diagnosis not present

## 2017-05-20 DIAGNOSIS — H25812 Combined forms of age-related cataract, left eye: Secondary | ICD-10-CM | POA: Diagnosis not present

## 2017-05-20 DIAGNOSIS — H2512 Age-related nuclear cataract, left eye: Secondary | ICD-10-CM | POA: Diagnosis not present

## 2017-05-23 ENCOUNTER — Other Ambulatory Visit: Payer: Self-pay | Admitting: Cardiovascular Disease

## 2017-06-08 DIAGNOSIS — Z23 Encounter for immunization: Secondary | ICD-10-CM | POA: Diagnosis not present

## 2017-06-08 DIAGNOSIS — E1140 Type 2 diabetes mellitus with diabetic neuropathy, unspecified: Secondary | ICD-10-CM | POA: Diagnosis not present

## 2017-06-08 DIAGNOSIS — I10 Essential (primary) hypertension: Secondary | ICD-10-CM | POA: Diagnosis not present

## 2017-06-08 DIAGNOSIS — Z794 Long term (current) use of insulin: Secondary | ICD-10-CM | POA: Diagnosis not present

## 2017-06-08 DIAGNOSIS — Z6822 Body mass index (BMI) 22.0-22.9, adult: Secondary | ICD-10-CM | POA: Diagnosis not present

## 2017-06-16 DIAGNOSIS — Z1231 Encounter for screening mammogram for malignant neoplasm of breast: Secondary | ICD-10-CM | POA: Diagnosis not present

## 2017-06-22 DIAGNOSIS — R921 Mammographic calcification found on diagnostic imaging of breast: Secondary | ICD-10-CM | POA: Diagnosis not present

## 2017-08-04 ENCOUNTER — Other Ambulatory Visit: Payer: Self-pay | Admitting: Physician Assistant

## 2017-08-04 DIAGNOSIS — I10 Essential (primary) hypertension: Secondary | ICD-10-CM

## 2017-08-18 DIAGNOSIS — Z1389 Encounter for screening for other disorder: Secondary | ICD-10-CM | POA: Diagnosis not present

## 2017-08-18 DIAGNOSIS — Z6822 Body mass index (BMI) 22.0-22.9, adult: Secondary | ICD-10-CM | POA: Diagnosis not present

## 2017-08-18 DIAGNOSIS — I2510 Atherosclerotic heart disease of native coronary artery without angina pectoris: Secondary | ICD-10-CM | POA: Diagnosis not present

## 2017-08-18 DIAGNOSIS — E042 Nontoxic multinodular goiter: Secondary | ICD-10-CM | POA: Diagnosis not present

## 2017-08-18 DIAGNOSIS — E871 Hypo-osmolality and hyponatremia: Secondary | ICD-10-CM | POA: Diagnosis not present

## 2017-08-18 DIAGNOSIS — M810 Age-related osteoporosis without current pathological fracture: Secondary | ICD-10-CM | POA: Diagnosis not present

## 2017-08-18 DIAGNOSIS — Z794 Long term (current) use of insulin: Secondary | ICD-10-CM | POA: Diagnosis not present

## 2017-08-18 DIAGNOSIS — R740 Nonspecific elevation of levels of transaminase and lactic acid dehydrogenase [LDH]: Secondary | ICD-10-CM | POA: Diagnosis not present

## 2017-08-18 DIAGNOSIS — D696 Thrombocytopenia, unspecified: Secondary | ICD-10-CM | POA: Diagnosis not present

## 2017-08-18 DIAGNOSIS — E1142 Type 2 diabetes mellitus with diabetic polyneuropathy: Secondary | ICD-10-CM | POA: Diagnosis not present

## 2017-08-18 DIAGNOSIS — E559 Vitamin D deficiency, unspecified: Secondary | ICD-10-CM | POA: Diagnosis not present

## 2017-08-18 DIAGNOSIS — E7849 Other hyperlipidemia: Secondary | ICD-10-CM | POA: Diagnosis not present

## 2017-09-02 DIAGNOSIS — L821 Other seborrheic keratosis: Secondary | ICD-10-CM | POA: Diagnosis not present

## 2017-09-02 DIAGNOSIS — D1801 Hemangioma of skin and subcutaneous tissue: Secondary | ICD-10-CM | POA: Diagnosis not present

## 2017-09-02 DIAGNOSIS — D225 Melanocytic nevi of trunk: Secondary | ICD-10-CM | POA: Diagnosis not present

## 2017-09-02 DIAGNOSIS — D2371 Other benign neoplasm of skin of right lower limb, including hip: Secondary | ICD-10-CM | POA: Diagnosis not present

## 2017-09-02 DIAGNOSIS — B078 Other viral warts: Secondary | ICD-10-CM | POA: Diagnosis not present

## 2017-09-02 DIAGNOSIS — L820 Inflamed seborrheic keratosis: Secondary | ICD-10-CM | POA: Diagnosis not present

## 2017-09-02 DIAGNOSIS — D2262 Melanocytic nevi of left upper limb, including shoulder: Secondary | ICD-10-CM | POA: Diagnosis not present

## 2017-09-02 DIAGNOSIS — L814 Other melanin hyperpigmentation: Secondary | ICD-10-CM | POA: Diagnosis not present

## 2017-09-06 ENCOUNTER — Other Ambulatory Visit: Payer: Self-pay | Admitting: *Deleted

## 2017-09-06 ENCOUNTER — Other Ambulatory Visit: Payer: Self-pay | Admitting: Cardiovascular Disease

## 2017-09-06 DIAGNOSIS — E785 Hyperlipidemia, unspecified: Secondary | ICD-10-CM

## 2017-10-06 DIAGNOSIS — Z794 Long term (current) use of insulin: Secondary | ICD-10-CM | POA: Diagnosis not present

## 2017-10-06 DIAGNOSIS — Z6822 Body mass index (BMI) 22.0-22.9, adult: Secondary | ICD-10-CM | POA: Diagnosis not present

## 2017-10-06 DIAGNOSIS — I10 Essential (primary) hypertension: Secondary | ICD-10-CM | POA: Diagnosis not present

## 2017-10-06 DIAGNOSIS — E1140 Type 2 diabetes mellitus with diabetic neuropathy, unspecified: Secondary | ICD-10-CM | POA: Diagnosis not present

## 2017-10-19 ENCOUNTER — Other Ambulatory Visit: Payer: Self-pay | Admitting: Cardiovascular Disease

## 2017-10-22 ENCOUNTER — Ambulatory Visit (INDEPENDENT_AMBULATORY_CARE_PROVIDER_SITE_OTHER): Payer: Medicare Other | Admitting: Cardiovascular Disease

## 2017-10-22 ENCOUNTER — Encounter: Payer: Self-pay | Admitting: Cardiovascular Disease

## 2017-10-22 VITALS — BP 140/58 | HR 73 | Ht 62.0 in | Wt 128.0 lb

## 2017-10-22 DIAGNOSIS — I2510 Atherosclerotic heart disease of native coronary artery without angina pectoris: Secondary | ICD-10-CM | POA: Diagnosis not present

## 2017-10-22 DIAGNOSIS — I10 Essential (primary) hypertension: Secondary | ICD-10-CM | POA: Diagnosis not present

## 2017-10-22 LAB — LIPID PANEL
Chol/HDL Ratio: 2.6 ratio (ref 0.0–4.4)
Cholesterol, Total: 91 mg/dL — ABNORMAL LOW (ref 100–199)
HDL: 35 mg/dL — ABNORMAL LOW (ref >39)
LDL CALC: 47 mg/dL (ref 0–99)
Triglycerides: 44 mg/dL (ref 0–149)
VLDL CHOLESTEROL CAL: 9 mg/dL (ref 5–40)

## 2017-10-22 LAB — HEPATIC FUNCTION PANEL
ALBUMIN: 4.4 g/dL (ref 3.5–4.7)
ALT: 22 IU/L (ref 0–32)
AST: 33 IU/L (ref 0–40)
Alkaline Phosphatase: 48 IU/L (ref 39–117)
Bilirubin Total: 0.5 mg/dL (ref 0.0–1.2)
Bilirubin, Direct: 0.21 mg/dL (ref 0.00–0.40)
TOTAL PROTEIN: 6.4 g/dL (ref 6.0–8.5)

## 2017-10-22 LAB — BASIC METABOLIC PANEL
BUN/Creatinine Ratio: 18 (ref 12–28)
BUN: 13 mg/dL (ref 8–27)
CALCIUM: 10.3 mg/dL (ref 8.7–10.3)
CO2: 24 mmol/L (ref 20–29)
CREATININE: 0.72 mg/dL (ref 0.57–1.00)
Chloride: 98 mmol/L (ref 96–106)
GFR calc Af Amer: 90 mL/min/{1.73_m2} (ref >59)
GFR, EST NON AFRICAN AMERICAN: 78 mL/min/{1.73_m2} (ref >59)
Glucose: 179 mg/dL — ABNORMAL HIGH (ref 65–99)
POTASSIUM: 4.6 mmol/L (ref 3.5–5.2)
Sodium: 138 mmol/L (ref 134–144)

## 2017-10-22 MED ORDER — ISOSORBIDE MONONITRATE ER 30 MG PO TB24: 30.0000 mg | ORAL_TABLET | Freq: Every day | ORAL | 3 refills | Status: DC

## 2017-10-22 MED ORDER — TELMISARTAN 20 MG PO TABS: 20.0000 mg | ORAL_TABLET | Freq: Every day | ORAL | 3 refills | Status: DC

## 2017-10-22 MED ORDER — NITROGLYCERIN 0.4 MG SL SUBL: 0.4000 mg | SUBLINGUAL_TABLET | SUBLINGUAL | 6 refills | Status: DC | PRN

## 2017-10-22 NOTE — Progress Notes (Signed)
TODA

## 2017-10-22 NOTE — Progress Notes (Signed)
Cardiology Office Note   Date:  10/22/2017   ID:  Cynthia [name], DOB September 16, 1931, MRN [id number]  PCP:  Reynold Bowen, MD  Cardiologist:   Mertie Moores, MD   Chief Complaint  Patient presents with  . Coronary Artery Disease   1. Coronary artery disease-status post PCI( April , 1998) and CABG ( Nov. 1998) 2. Dyslipidemia 3. Diabetes mellitus 4. Left carotid bruit 5. Right hip arthritis     Cynthia [name] is a 84 y.o. y.o. female with a history of coronary artery disease. She status post PCI and then later had coronary artery bypass grafting. She also has a history of dyslipidemia, diabetes mellitus, and a left carotid bruit.  She has done well from a cardiac standpoint. She has not been exercising as much as she has in the past. She complains of generalized fatigue. She stays busy doing chores. 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  January 16, 2013:  Cynthia [name] is dong well. She is getting over several viral infections over the past few months. No CP.   Dec. 22,2014:  Cynthia [name] has had a rough summer. Also has had a cold. Her husband has been at [facility] since early Sept. No cardiac issues.   January 08, 2014:  Cynthia [name] is doing ok. She has lost lots of weight over the past year or so and now is gaining some weight back. Her has an abdominal bruit. Renal artery duplex study was normal.   She has started insulin therapy.   Dec. 1, 2015:  Cynthia [name] is an 84 yo who I follow for CAD, dyslipidema.  She has had some BP variablility.  No CP,  Has not been exercising. Has hip / back problems.  Her husband passed away this past January 20, 2023.    January 11, 2015:  Cynthia [name] is a 84 y.o. female who presents for  Follow up of her CAD and HTN. Having lots of pain this am - as a result , her BP is high  May need to have hip surgery    Dec. 2, 2016:  Doing well.  No cardiac issues. Had her R hip replacement.  [address] and broke her left arm while using her  walker.   Bp and HR are well controlled.    January 09, 2016: Doing well.  No CP or dyspnea. Does not have the endurance that she used to have   Dec. 14, 2017  Having more DOE and chest pressure with exertion. Similar to prior to her CABG  Has not tried a SL NTG  Her symptoms started 3 weeks ago. Now she cannot do any exercise before she gets some chest tightness.   Jan. 25 2018: She presented to the office with angina in mid December. She was sent to the hospital and had a heart catheterization. She was found to have an ostial occlusion of the left circumflex artery. The LIMA to LAD was patent but the IMA was atretic.Cynthia [name] She has a proximal/mid LAD stent that is patent. There is a patent saphenous vein graft to the obtuse marginal artery and a patent saphenous vein graft to the second diagonal. The saphenous vein graft to the right coronary artery is occluded.  She had moderate left ventricle systolic function by cath  with an EF of 35-40%. She had inferior apical akinesis.  Echocardiogram revealed normal left ventricle systolic function. She has mild tricuspid regurgitation and mild mitral regurgitation.  She seems to be feeling better.  No further episodes of CP or dyspnea.    Aug. 17, 2018:   . Has some rare episodes of chest tightness ( especially when carrying heavy objects )   October 22, 2017: He is seen back today for follow-up of her coronary artery disease, dyslipidemia, and peripheral vascular disease. She has noticed some chest pressure if she waks too fast  - especialy if she is carrying something .   Last cath was in Dec. 2017.   She thinks her symptoms have worsened since that time  Was on Valsartan - was recalled,    Past Medical History:  Diagnosis Date  . Anxiety   . Arthritis   . Carotid bruit    LEFT  . Coronary artery disease    STATUS POST PCI AND LATER CABG  . Diabetes mellitus   . Dyslipidemia   . Dysrhythmia    hx of palpitations and PVC's   .  History of chicken pox   . History of echocardiogram    Echo Sep 09, 2022: mild LVH, EF 55-60, no RWMA, Gr 1 DD, MAC, mild MR, mild LAE, mild TR, PASP 31  . Hypertension   . Measles    hx of   . Mumps    hx of   . Myocardial infarction ([place])   . Palpitations   . PVC's (premature ventricular contractions)     Past Surgical History:  Procedure Laterality Date  . CARDIAC CATHETERIZATION  06/06/97   IT REVEALA MILD TO MODERATE LEFT VENTRICULAR SYSTOLIC DYSFUNCTION  WITH EF OF 40%. THERE IS PERSISTENT AKINESIS OF THE INFERIOR WALL. THERE IS MILD REGURGITATION PRESENT. THERE IS MILD HYPOKINESIS OF THE ANTERIOR  AND APEX WALL  . CARDIAC CATHETERIZATION N/A 07/27/2016   Procedure: Left Heart Cath and Cors/Grafts Angiography;  Surgeon: Nelva Bush, MD;  Location: [facility];  Service: Cardiovascular;  Laterality: N/A;  . CORONARY ARTERY BYPASS GRAFT    . TONSILLECTOMY    . TOTAL HIP ARTHROPLASTY Right 03/12/2015   Procedure: RIGHT TOTAL HIP ARTHROPLASTY ANTERIOR APPROACH;  Surgeon: Paralee Cancel, MD;  Location: [facility];  Service: Orthopedics;  Laterality: Right;     Current Outpatient Medications  Medication Sig Dispense Refill  . amoxicillin (AMOXIL) 500 MG capsule Take 2,000 mg by mouth See admin instructions. Dor Dental procedures    . aspirin EC 81 MG tablet Take 81 mg by mouth daily.    Cynthia [name] atorvastatin (LIPITOR) 40 MG tablet Take 1 tablet (40 mg total) by mouth daily. 90 tablet 2  . B-D INS SYR ULTRAFINE 1CC/31G 31G X 5/16" 1 ML MISC     . BD PEN NEEDLE NANO U/F 32G X 4 MM MISC Inject 1 each into the skin as directed. With insulin  6  . calcium-vitamin D (CALCIUM 500+D) 500-400 MG-UNIT per tablet Take 1 tablet by mouth 2 (two) times daily.     . Coenzyme Q10 (COQ10) 200 MG CAPS Take 1 capsule by mouth every evening.     . ezetimibe (ZETIA) 10 MG tablet Take 1 tablet (10 mg total) by mouth daily. 90 tablet 1  . fenofibrate (TRICOR) 145 MG tablet Take 145 mg by mouth daily.    .  isosorbide mononitrate (IMDUR) 30 MG 24 hr tablet Take 15 mg by mouth daily.    Cynthia [name] losartan (COZAAR) 25 MG tablet Take 25 mg by mouth every morning.    . Multiple Vitamin (MULTIVITAMIN) tablet Take 1 tablet by mouth every morning.     Cynthia [name] NOVOLOG FLEXPEN  100 UNIT/ML FlexPen Inject 3 Units as directed 3 (three) times daily with meals.  4  . ONE TOUCH ULTRA TEST test strip 2 (two) times daily. test blood sugar  5  . ONETOUCH DELICA LANCETS 96P MISC CHECK BLOOD SUGARS 2 TIMES A DAY  5  . Polyethyl Glycol-Propyl Glycol (SYSTANE OP) Apply 1 drop to eye daily as needed (dry scratchy eyes).    . sitaGLIPtin-metformin (JANUMET) 50-1000 MG tablet Take 1 tablet by mouth 2 (two) times daily with a meal.    . TRESIBA FLEXTOUCH 100 UNIT/ML SOPN Inject 10 Units as directed daily.  4  . vitamin C (ASCORBIC ACID) 500 MG tablet Take 500 mg by mouth every evening.     . nitroGLYCERIN (NITROSTAT) 0.4 MG SL tablet Place 1 tablet (0.4 mg total) under the tongue every 5 (five) minutes as needed for chest pain. 25 tablet 6   No current facility-administered medications for this visit.     Allergies:   Patient has no known allergies.    Social History:  The patient  reports that  has never smoked. she has never used smokeless tobacco. She reports that she does not drink alcohol or use drugs.   Family History:  The patient's family history includes Cancer in her father; Heart attack in her mother; Stroke in her father.    ROS: Noted in current history, all other systems are negative.  Physical Exam: Blood pressure (!) 140/58, pulse 73, height _0  (1.575 m), weight 128 lb (58.1 kg), SpO2 98 %.  GEN:  Well nourished, well developed in no acute distress HEENT: Normal NECK: No JVD; No carotid bruits LYMPHATICS: No lymphadenopathy CARDIAC: RRR  RESPIRATORY:  Clear to auscultation without rales, wheezing or rhonchi  ABDOMEN: Soft, non-tender, non-distended MUSCULOSKELETAL:  No edema; No deformity  SKIN: Warm and  dry NEUROLOGIC:  Alert and oriented x 3   EKG:   Normal sinus rhythm at 72.  She has a nonspecific IVCD.   Recent Labs: 03/24/2017: ALT 19; BUN 9; Creatinine, Ser 0.66; Potassium 4.3; Sodium 136    Lipid Panel    Component Value Date/Time   CHOL 97 (L) 03/24/2017 0742   TRIG 39 03/24/2017 0742   HDL 35 (L) 03/24/2017 0742   CHOLHDL 2.8 03/24/2017 0742   CHOLHDL 2.5 01/09/2016 0952   VLDL 8 01/09/2016 0952   LDLCALC 54 03/24/2017 0742      Wt Readings from Last 3 Encounters:  10/22/17 128 lb (58.1 kg)  03/26/17 125 lb 6.4 oz (56.9 kg)  09/03/16 124 lb 1.9 oz (56.3 kg)      Other studies Reviewed: Additional studies/ records that were reviewed today include: . Review of the above records demonstrates:    ASSESSMENT AND PLAN:  1. Coronary artery disease-status post PCI( April , 1998) and CABG ( Nov. 1998) -  This Dunklee continues to have episodes of angina.  Heart catheterization in December, 2017 reveals an atretic LIMA.  She has a chronically occluded circumflex artery.  She continues to have intermittent episodes of angina which we are treating medically. We will increase the isosorbide to 30 mg a day.  Pressure is also a little bit elevated.  We will discontinue the losartan and start on telmisartan 20 mg a day.  It may also help with her angina.    2. Dyslipidemia -   check labs today.  Continue current meds.   3.  Hypertension:    Current medicines are reviewed at length with the  patient today.  The patient does not have concerns regarding medicines.  The following changes have been made:  no change  Labs/ tests ordered today include:  No orders of the defined types were placed in this encounter.  Disposition:   FU with me in 3 months    Mertie Moores, MD  10/22/2017 9:47 AM    [address], [place], [place]  [address] Phone: [phone]; Fax: [phone]

## 2017-10-22 NOTE — Patient Instructions (Addendum)
Medication Instructions:  Your physician has recommended you make the following change in your medication: STOP Losartan after you finish your current bottle then START Telmisartan (Micardis) 20 mg once daily  INCREASE Imdur (Isosorbide) to 30 mg once daily   Labwork: TODAY - cholesterol, liver panel, basic metabolic panel  Your physician recommends that you return for lab work in: 7 weeks for basic metabolic panel   Testing/Procedures: None Ordered   Follow-Up: Your physician recommends that you schedule a follow-up appointment in: 3 months with Dr. Acie Fredrickson   If you need a refill on your cardiac medications before your next appointment, please call your pharmacy.   Thank you for choosing [facility]! Christen Bame, RN [phone]

## 2017-12-10 ENCOUNTER — Other Ambulatory Visit: Payer: Medicare Other | Admitting: *Deleted

## 2017-12-10 DIAGNOSIS — I10 Essential (primary) hypertension: Secondary | ICD-10-CM

## 2017-12-10 DIAGNOSIS — I2510 Atherosclerotic heart disease of native coronary artery without angina pectoris: Secondary | ICD-10-CM

## 2017-12-10 LAB — BASIC METABOLIC PANEL
BUN/Creatinine Ratio: 14 (ref 12–28)
BUN: 10 mg/dL (ref 8–27)
CO2: 22 mmol/L (ref 20–29)
CREATININE: 0.69 mg/dL (ref 0.57–1.00)
Calcium: 9.5 mg/dL (ref 8.7–10.3)
Chloride: 98 mmol/L (ref 96–106)
GFR calc non Af Amer: 81 mL/min/{1.73_m2} (ref >59)
GFR, EST AFRICAN AMERICAN: 93 mL/min/{1.73_m2} (ref >59)
GLUCOSE: 155 mg/dL — AB (ref 65–99)
Potassium: 4.8 mmol/L (ref 3.5–5.2)
Sodium: 132 mmol/L — ABNORMAL LOW (ref 134–144)

## 2017-12-21 DIAGNOSIS — Z794 Long term (current) use of insulin: Secondary | ICD-10-CM | POA: Diagnosis not present

## 2017-12-21 DIAGNOSIS — E042 Nontoxic multinodular goiter: Secondary | ICD-10-CM | POA: Diagnosis not present

## 2017-12-21 DIAGNOSIS — R740 Nonspecific elevation of levels of transaminase and lactic acid dehydrogenase [LDH]: Secondary | ICD-10-CM | POA: Diagnosis not present

## 2017-12-21 DIAGNOSIS — E7849 Other hyperlipidemia: Secondary | ICD-10-CM | POA: Diagnosis not present

## 2017-12-21 DIAGNOSIS — E1142 Type 2 diabetes mellitus with diabetic polyneuropathy: Secondary | ICD-10-CM | POA: Diagnosis not present

## 2017-12-21 DIAGNOSIS — D696 Thrombocytopenia, unspecified: Secondary | ICD-10-CM | POA: Diagnosis not present

## 2017-12-21 DIAGNOSIS — D500 Iron deficiency anemia secondary to blood loss (chronic): Secondary | ICD-10-CM | POA: Diagnosis not present

## 2017-12-21 DIAGNOSIS — E1140 Type 2 diabetes mellitus with diabetic neuropathy, unspecified: Secondary | ICD-10-CM | POA: Diagnosis not present

## 2017-12-21 DIAGNOSIS — I10 Essential (primary) hypertension: Secondary | ICD-10-CM | POA: Diagnosis not present

## 2017-12-21 DIAGNOSIS — M859 Disorder of bone density and structure, unspecified: Secondary | ICD-10-CM | POA: Diagnosis not present

## 2017-12-21 DIAGNOSIS — E871 Hypo-osmolality and hyponatremia: Secondary | ICD-10-CM | POA: Diagnosis not present

## 2017-12-21 DIAGNOSIS — Z6822 Body mass index (BMI) 22.0-22.9, adult: Secondary | ICD-10-CM | POA: Diagnosis not present

## 2018-01-17 ENCOUNTER — Other Ambulatory Visit: Payer: Self-pay | Admitting: Cardiovascular Disease

## 2018-01-24 ENCOUNTER — Ambulatory Visit (INDEPENDENT_AMBULATORY_CARE_PROVIDER_SITE_OTHER): Payer: Medicare Other | Admitting: Cardiovascular Disease

## 2018-01-24 ENCOUNTER — Encounter: Payer: Self-pay | Admitting: Cardiovascular Disease

## 2018-01-24 VITALS — BP 120/50 | HR 63 | Ht 62.0 in | Wt 127.2 lb

## 2018-01-24 DIAGNOSIS — I2510 Atherosclerotic heart disease of native coronary artery without angina pectoris: Secondary | ICD-10-CM | POA: Diagnosis not present

## 2018-01-24 DIAGNOSIS — R0989 Other specified symptoms and signs involving the circulatory and respiratory systems: Secondary | ICD-10-CM | POA: Diagnosis not present

## 2018-01-24 DIAGNOSIS — E782 Mixed hyperlipidemia: Secondary | ICD-10-CM

## 2018-01-24 NOTE — Patient Instructions (Addendum)
Medication Instructions:  Your physician recommends that you continue on your current medications as directed. Please refer to the Current Medication list given to you today.   Labwork: Your physician recommends that you return for lab work in: 6 months on the day of or a few days before your office visit  You will need to FAST for this appointment - nothing to eat or drink after midnight the night before except water.   Testing/Procedures: Your physician has requested that you have a carotid duplex. This test is an ultrasound of the carotid arteries in your neck. It looks at blood flow through these arteries that supply the brain with blood. Allow one hour for this exam. There are no restrictions or special instructions.    Follow-Up: Your physician wants you to follow-up in: 6 months with Richardson Dopp, PA. You will receive a reminder letter in the mail two months in advance. If you don't receive a letter, please call our office to schedule the follow-up appointment.   If you need a refill on your cardiac medications before your next appointment, please call your pharmacy.   Thank you for choosing [facility]! Christen Bame, RN [phone]

## 2018-01-24 NOTE — Progress Notes (Signed)
Cardiology Office Note   Date:  01/24/2018   ID:  [name], DOB 07-06-1932, MRN [id number]  PCP:  Reynold Bowen, MD  Cardiologist:   Mertie Moores, MD   No chief complaint on file.  1. Coronary artery disease-status post PCI( April , 1998) and CABG ( Nov. 1998) 2. Dyslipidemia 3. Diabetes mellitus 4. Left carotid bruit 5. Right hip arthritis     Cynthia [name] is a 84 y.o. y.o. female with a history of coronary artery disease. She status post PCI and then later had coronary artery bypass grafting. She also has a history of dyslipidemia, diabetes mellitus, and a left carotid bruit.  She has done well from a cardiac standpoint. She has not been exercising as much as she has in the past. She complains of generalized fatigue. She stays busy doing chores. 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  January 16, 2013:  [name] is dong well. She is getting over several viral infections over the past few months. No CP.   Dec. 22,2014:  [name] has had a rough summer. Also has had a cold. Her husband has been at [facility] since early Sept. No cardiac issues.   January 08, 2014:  [name] is doing ok. She has lost lots of weight over the past year or so and now is gaining some weight back. Her has an abdominal bruit. Renal artery duplex study was normal.   She has started insulin therapy.   Dec. 1, 2015:  [name] is an 84 yo who I follow for CAD, dyslipidema.  She has had some BP variablility.  No CP,  Has not been exercising. Has hip / back problems.  Her husband passed away this past 29-Jan-2023.    January 11, 2015:  [name] is a 84 y.o. female who presents for  Follow up of her CAD and HTN. Having lots of pain this am - as a result , her BP is high  May need to have hip surgery    Dec. 2, 2016:  Doing well.  No cardiac issues. Had her R hip replacement.  [address] and broke her left arm while using her walker.   Bp and HR are well controlled.      January 09, 2016: Doing well.  No CP or dyspnea. Does not have the endurance that she used to have   Dec. 14, 2017  Having more DOE and chest pressure with exertion. Similar to prior to her CABG  Has not tried a SL NTG  Her symptoms started 3 weeks ago. Now she cannot do any exercise before she gets some chest tightness.   Jan. 25 2018: She presented to the office with angina in mid December. She was sent to the hospital and had a heart catheterization. She was found to have an ostial occlusion of the left circumflex artery. The LIMA to LAD was patent but the IMA was atretic.Cynthia [name] She has a proximal/mid LAD stent that is patent. There is a patent saphenous vein graft to the obtuse marginal artery and a patent saphenous vein graft to the second diagonal. The saphenous vein graft to the right coronary artery is occluded.  She had moderate left ventricle systolic function by cath  with an EF of 35-40%. She had inferior apical akinesis.  Echocardiogram revealed normal left ventricle systolic function. She has mild tricuspid regurgitation and mild mitral regurgitation.  She seems to be feeling better.    No further episodes  of CP or dyspnea.    Aug. 17, 2018:   . Has some rare episodes of chest tightness ( especially when carrying heavy objects )   October 22, 2017: He is seen back today for follow-up of her coronary artery disease, dyslipidemia, and peripheral vascular disease. She has noticed some chest pressure if she waks too fast  - especialy if she is carrying something .   Last cath was in Dec. 2017.   She thinks her symptoms have worsened since that time  Was on Valsartan - was recalled,    January 24, 2018:   [name] is seen today for follow-up of her coronary artery disease, diabetes mellitus and hyperlipidemia. Is now on Telmasartan  Brought her BP log,  Most readings are good  No cp or dyspnea    Past Medical History:  Diagnosis Date  . Anxiety   . Arthritis   . Carotid bruit     LEFT  . Coronary artery disease    STATUS POST PCI AND LATER CABG  . Diabetes mellitus   . Dyslipidemia   . Dysrhythmia    hx of palpitations and PVC's   . History of chicken pox   . History of echocardiogram    Echo 09/20/22: mild LVH, EF 55-60, no RWMA, Gr 1 DD, MAC, mild MR, mild LAE, mild TR, PASP 31  . Hypertension   . Measles    hx of   . Mumps    hx of   . Myocardial infarction ([place])   . Palpitations   . PVC's (premature ventricular contractions)     Past Surgical History:  Procedure Laterality Date  . CARDIAC CATHETERIZATION  06/06/97   IT REVEALA MILD TO MODERATE LEFT VENTRICULAR SYSTOLIC DYSFUNCTION  WITH EF OF 40%. THERE IS PERSISTENT AKINESIS OF THE INFERIOR WALL. THERE IS MILD REGURGITATION PRESENT. THERE IS MILD HYPOKINESIS OF THE ANTERIOR  AND APEX WALL  . CARDIAC CATHETERIZATION N/A 07/27/2016   Procedure: Left Heart Cath and Cors/Grafts Angiography;  Surgeon: Nelva Bush, MD;  Location: [facility];  Service: Cardiovascular;  Laterality: N/A;  . CORONARY ARTERY BYPASS GRAFT    . TONSILLECTOMY    . TOTAL HIP ARTHROPLASTY Right 03/12/2015   Procedure: RIGHT TOTAL HIP ARTHROPLASTY ANTERIOR APPROACH;  Surgeon: Paralee Cancel, MD;  Location: [facility];  Service: Orthopedics;  Laterality: Right;     Current Outpatient Medications  Medication Sig Dispense Refill  . amoxicillin (AMOXIL) 500 MG capsule Take 2,000 mg by mouth See admin instructions. Dor Dental procedures    . aspirin EC 81 MG tablet Take 81 mg by mouth daily.    Cynthia [name] atorvastatin (LIPITOR) 40 MG tablet Take 1 tablet (40 mg total) by mouth daily. 90 tablet 2  . B-D INS SYR ULTRAFINE 1CC/31G 31G X 5/16" 1 ML MISC     . BD PEN NEEDLE NANO U/F 32G X 4 MM MISC Inject 1 each into the skin as directed. With insulin  6  . calcium-vitamin D (CALCIUM 500+D) 500-400 MG-UNIT per tablet Take 1 tablet by mouth 2 (two) times daily.     . Coenzyme Q10 (COQ10) 200 MG CAPS Take 1 capsule by mouth every evening.     .  ezetimibe (ZETIA) 10 MG tablet Take 1 tablet (10 mg total) by mouth daily. 90 tablet 1  . fenofibrate (TRICOR) 145 MG tablet Take 145 mg by mouth daily.    . isosorbide mononitrate (IMDUR) 30 MG 24 hr tablet Take 1 tablet (30 mg  total) by mouth daily. 90 tablet 3  . Multiple Vitamin (MULTIVITAMIN) tablet Take 1 tablet by mouth every morning.     Cynthia [name] NOVOLOG FLEXPEN 100 UNIT/ML FlexPen Inject 3 Units as directed 3 (three) times daily with meals.  4  . ONE TOUCH ULTRA TEST test strip 2 (two) times daily. test blood sugar  5  . ONETOUCH DELICA LANCETS 54S MISC CHECK BLOOD SUGARS 2 TIMES A DAY  5  . Polyethyl Glycol-Propyl Glycol (SYSTANE OP) Apply 1 drop to eye daily as needed (dry scratchy eyes).    . sitaGLIPtin-metformin (JANUMET) 50-1000 MG tablet Take 1 tablet by mouth 2 (two) times daily with a meal.    . telmisartan (MICARDIS) 20 MG tablet Take 1 tablet (20 mg total) by mouth daily. 90 tablet 3  . TRESIBA FLEXTOUCH 100 UNIT/ML SOPN Inject 10 Units as directed daily.  4  . vitamin C (ASCORBIC ACID) 500 MG tablet Take 500 mg by mouth every evening.     . nitroGLYCERIN (NITROSTAT) 0.4 MG SL tablet Place 1 tablet (0.4 mg total) under the tongue every 5 (five) minutes as needed for chest pain. 25 tablet 6   No current facility-administered medications for this visit.     Allergies:   Patient has no known allergies.    Social History:  The patient  reports that she has never smoked. She has never used smokeless tobacco. She reports that she does not drink alcohol or use drugs.   Family History:  The patient's family history includes Cancer in her father; Heart attack in her mother; Stroke in her father.    ROS: Noted in current history, all other systems are negative.  Physical Exam: Blood pressure (!) 120/50, pulse 63, height _0  (1.575 m), weight 127 lb 3.2 oz (57.7 kg), SpO2 98 %.  GEN:  Well nourished, well developed in no acute distress HEENT: Normal NECK: No JVD;  + left  Carotid  bruit  LYMPHATICS: No lymphadenopathy CARDIAC: RRR , no murmurs, rubs, gallops RESPIRATORY:  Clear to auscultation without rales, wheezing or rhonchi  ABDOMEN: Soft, non-tender, non-distended MUSCULOSKELETAL:  No edema; No deformity  SKIN: Warm and dry NEUROLOGIC:  Alert and oriented x 3  EKG:     Recent Labs: 10/22/2017: ALT 22 12/10/2017: BUN 10; Creatinine, Ser 0.69; Potassium 4.8; Sodium 132    Lipid Panel    Component Value Date/Time   CHOL 91 (L) 10/22/2017 1031   TRIG 44 10/22/2017 1031   HDL 35 (L) 10/22/2017 1031   CHOLHDL 2.6 10/22/2017 1031   CHOLHDL 2.5 01/09/2016 0952   VLDL 8 01/09/2016 0952   LDLCALC 47 10/22/2017 1031      Wt Readings from Last 3 Encounters:  01/24/18 127 lb 3.2 oz (57.7 kg)  10/22/17 128 lb (58.1 kg)  03/26/17 125 lb 6.4 oz (56.9 kg)      Other studies Reviewed: Additional studies/ records that were reviewed today include: . Review of the above records demonstrates:    ASSESSMENT AND PLAN:  1. Coronary artery disease-status post PCI( April , 1998) and CABG ( Nov. 1998) -  Her last heart catheterization was in December, 2017 which revealed an atretic LIMA.  Her native LAD has mild nonobstructive disease and has stents.  She has 35% stenosis in the saphenous vein graft to the second diagonal. Will return to see Scott in 6 months   2. Dyslipidemia -   Labs have been good.   Check labs in 6 months  3.  Hypertension:    BP is well controlled  Current medicines are reviewed at length with the patient today.  The patient does not have concerns regarding medicines.  4.  Left carotid bruit: We will check a carotid duplex scan.  The following changes have been made:  no change  Labs/ tests ordered today include:   Orders Placed This Encounter  Procedures  . Lipid Profile  . Basic Metabolic Panel (BMET)  . Hepatic function panel   Disposition:   FU with Scott in 6 months  Mertie Moores, MD  01/24/2018 1:46 PM    [facility] [address], [place], [place]  [address] Phone: [phone]; Fax: [phone]

## 2018-01-26 ENCOUNTER — Other Ambulatory Visit: Payer: Self-pay | Admitting: Cardiovascular Disease

## 2018-01-26 DIAGNOSIS — R0989 Other specified symptoms and signs involving the circulatory and respiratory systems: Secondary | ICD-10-CM

## 2018-01-26 DIAGNOSIS — I6523 Occlusion and stenosis of bilateral carotid arteries: Secondary | ICD-10-CM

## 2018-02-01 DIAGNOSIS — E7849 Other hyperlipidemia: Secondary | ICD-10-CM | POA: Diagnosis not present

## 2018-02-01 DIAGNOSIS — Z794 Long term (current) use of insulin: Secondary | ICD-10-CM | POA: Diagnosis not present

## 2018-02-01 DIAGNOSIS — I10 Essential (primary) hypertension: Secondary | ICD-10-CM | POA: Diagnosis not present

## 2018-02-01 DIAGNOSIS — Z6822 Body mass index (BMI) 22.0-22.9, adult: Secondary | ICD-10-CM | POA: Diagnosis not present

## 2018-02-01 DIAGNOSIS — E1140 Type 2 diabetes mellitus with diabetic neuropathy, unspecified: Secondary | ICD-10-CM | POA: Diagnosis not present

## 2018-02-14 ENCOUNTER — Ambulatory Visit (HOSPITAL_COMMUNITY)
Admission: RE | Admit: 2018-02-14 | Discharge: 2018-02-14 | Disposition: A | Discharge status: Home / Self Care | Payer: Medicare Other | Source: Ambulatory Visit | Attending: Cardiovascular Disease | Admitting: Cardiovascular Disease

## 2018-02-14 DIAGNOSIS — R0989 Other specified symptoms and signs involving the circulatory and respiratory systems: Secondary | ICD-10-CM

## 2018-02-14 DIAGNOSIS — I6523 Occlusion and stenosis of bilateral carotid arteries: Secondary | ICD-10-CM | POA: Insufficient documentation

## 2018-02-28 ENCOUNTER — Other Ambulatory Visit: Payer: Self-pay | Admitting: Cardiovascular Disease

## 2018-02-28 DIAGNOSIS — E785 Hyperlipidemia, unspecified: Secondary | ICD-10-CM

## 2018-04-25 DIAGNOSIS — M810 Age-related osteoporosis without current pathological fracture: Secondary | ICD-10-CM | POA: Diagnosis not present

## 2018-04-25 DIAGNOSIS — I2510 Atherosclerotic heart disease of native coronary artery without angina pectoris: Secondary | ICD-10-CM | POA: Diagnosis not present

## 2018-04-25 DIAGNOSIS — E7849 Other hyperlipidemia: Secondary | ICD-10-CM | POA: Diagnosis not present

## 2018-04-25 DIAGNOSIS — E559 Vitamin D deficiency, unspecified: Secondary | ICD-10-CM | POA: Diagnosis not present

## 2018-04-25 DIAGNOSIS — Z794 Long term (current) use of insulin: Secondary | ICD-10-CM | POA: Diagnosis not present

## 2018-04-25 DIAGNOSIS — D72819 Decreased white blood cell count, unspecified: Secondary | ICD-10-CM | POA: Diagnosis not present

## 2018-04-25 DIAGNOSIS — R740 Nonspecific elevation of levels of transaminase and lactic acid dehydrogenase [LDH]: Secondary | ICD-10-CM | POA: Diagnosis not present

## 2018-04-25 DIAGNOSIS — E1142 Type 2 diabetes mellitus with diabetic polyneuropathy: Secondary | ICD-10-CM | POA: Diagnosis not present

## 2018-04-25 DIAGNOSIS — E871 Hypo-osmolality and hyponatremia: Secondary | ICD-10-CM | POA: Diagnosis not present

## 2018-04-25 DIAGNOSIS — I5189 Other ill-defined heart diseases: Secondary | ICD-10-CM | POA: Diagnosis not present

## 2018-04-25 DIAGNOSIS — Z6821 Body mass index (BMI) 21.0-21.9, adult: Secondary | ICD-10-CM | POA: Diagnosis not present

## 2018-04-25 DIAGNOSIS — E042 Nontoxic multinodular goiter: Secondary | ICD-10-CM | POA: Diagnosis not present

## 2018-05-14 DIAGNOSIS — Z23 Encounter for immunization: Secondary | ICD-10-CM | POA: Diagnosis not present

## 2018-05-15 ENCOUNTER — Other Ambulatory Visit: Payer: Self-pay | Admitting: Cardiovascular Disease

## 2018-06-07 DIAGNOSIS — I10 Essential (primary) hypertension: Secondary | ICD-10-CM | POA: Diagnosis not present

## 2018-06-07 DIAGNOSIS — Z6822 Body mass index (BMI) 22.0-22.9, adult: Secondary | ICD-10-CM | POA: Diagnosis not present

## 2018-06-07 DIAGNOSIS — E119 Type 2 diabetes mellitus without complications: Secondary | ICD-10-CM | POA: Diagnosis not present

## 2018-06-07 DIAGNOSIS — Z794 Long term (current) use of insulin: Secondary | ICD-10-CM | POA: Diagnosis not present

## 2018-06-27 DIAGNOSIS — Z1231 Encounter for screening mammogram for malignant neoplasm of breast: Secondary | ICD-10-CM | POA: Diagnosis not present

## 2018-06-28 DIAGNOSIS — H26493 Other secondary cataract, bilateral: Secondary | ICD-10-CM | POA: Diagnosis not present

## 2018-06-28 DIAGNOSIS — E119 Type 2 diabetes mellitus without complications: Secondary | ICD-10-CM | POA: Diagnosis not present

## 2018-06-28 DIAGNOSIS — H524 Presbyopia: Secondary | ICD-10-CM | POA: Diagnosis not present

## 2018-07-04 ENCOUNTER — Other Ambulatory Visit: Payer: Medicare Other | Admitting: *Deleted

## 2018-07-04 DIAGNOSIS — R0989 Other specified symptoms and signs involving the circulatory and respiratory systems: Secondary | ICD-10-CM

## 2018-07-04 DIAGNOSIS — E782 Mixed hyperlipidemia: Secondary | ICD-10-CM

## 2018-07-04 DIAGNOSIS — I2510 Atherosclerotic heart disease of native coronary artery without angina pectoris: Secondary | ICD-10-CM

## 2018-07-04 LAB — BASIC METABOLIC PANEL
BUN / CREAT RATIO: 14 (ref 12–28)
BUN: 10 mg/dL (ref 8–27)
CO2: 22 mmol/L (ref 20–29)
Calcium: 9.8 mg/dL (ref 8.7–10.3)
Chloride: 97 mmol/L (ref 96–106)
Creatinine, Ser: 0.72 mg/dL (ref 0.57–1.00)
GFR calc non Af Amer: 77 mL/min/{1.73_m2} (ref >59)
GFR, EST AFRICAN AMERICAN: 89 mL/min/{1.73_m2} (ref >59)
Glucose: 97 mg/dL (ref 65–99)
POTASSIUM: 4.1 mmol/L (ref 3.5–5.2)
Sodium: 135 mmol/L (ref 134–144)

## 2018-07-04 LAB — HEPATIC FUNCTION PANEL
ALT: 22 IU/L (ref 0–32)
AST: 37 IU/L (ref 0–40)
Albumin: 4.5 g/dL (ref 3.5–4.7)
Alkaline Phosphatase: 45 IU/L (ref 39–117)
BILIRUBIN, DIRECT: 0.25 mg/dL (ref 0.00–0.40)
Bilirubin Total: 0.7 mg/dL (ref 0.0–1.2)
Total Protein: 6.2 g/dL (ref 6.0–8.5)

## 2018-07-04 LAB — LIPID PANEL
CHOL/HDL RATIO: 2.4 ratio (ref 0.0–4.4)
Cholesterol, Total: 90 mg/dL — ABNORMAL LOW (ref 100–199)
HDL: 37 mg/dL — AB (ref >39)
LDL CALC: 45 mg/dL (ref 0–99)
TRIGLYCERIDES: 42 mg/dL (ref 0–149)
VLDL Cholesterol Cal: 8 mg/dL (ref 5–40)

## 2018-07-12 ENCOUNTER — Ambulatory Visit (INDEPENDENT_AMBULATORY_CARE_PROVIDER_SITE_OTHER): Payer: Medicare Other | Admitting: Medical

## 2018-07-12 ENCOUNTER — Encounter: Payer: Self-pay | Admitting: Physician Assistant

## 2018-07-12 VITALS — BP 130/50 | HR 58 | Ht 62.0 in | Wt 125.8 lb

## 2018-07-12 DIAGNOSIS — E785 Hyperlipidemia, unspecified: Secondary | ICD-10-CM

## 2018-07-12 DIAGNOSIS — I6523 Occlusion and stenosis of bilateral carotid arteries: Secondary | ICD-10-CM | POA: Diagnosis not present

## 2018-07-12 DIAGNOSIS — I25119 Atherosclerotic heart disease of native coronary artery with unspecified angina pectoris: Secondary | ICD-10-CM

## 2018-07-12 DIAGNOSIS — E119 Type 2 diabetes mellitus without complications: Secondary | ICD-10-CM | POA: Diagnosis not present

## 2018-07-12 DIAGNOSIS — I10 Essential (primary) hypertension: Secondary | ICD-10-CM

## 2018-07-12 NOTE — Patient Instructions (Signed)
Medication Instructions:  Your physician recommends that you continue on your current medications as directed. Please refer to the Current Medication list given to you today.  If you need a refill on your cardiac medications before your next appointment, please call your pharmacy.   Lab work: NONE If you have labs (blood work) drawn today and your tests are completely normal, you will receive your results only by: [name] MyChart Message (if you have MyChart) OR . A paper copy in the mail If you have any lab test that is abnormal or we need to change your treatment, we will call you to review the results.  Testing/Procedures: NONE  Follow-Up: At [facility], you and your health needs are our priority.  As part of our continuing mission to provide you with exceptional heart care, we have created designated Provider Care Teams.  These Care Teams include your primary Cardiologist (physician) and Advanced Practice Providers (APPs -  Physician Assistants and Nurse Practitioners) who all work together to provide you with the care you need, when you need it. You will need a follow up appointment in:  6 months.  Please call our office 2 months in advance to schedule this appointment.  You may see Mertie Moores, MD or one of the following Advanced Practice Providers on your designated Care Team: Richardson Dopp, PA-C [place], [place] . Daune Perch, NP  Any Other Special Instructions Will Be Listed Below (If Applicable).   DASH Eating Plan DASH stands for "Dietary Approaches to Stop Hypertension." The DASH eating plan is a healthy eating plan that has been shown to reduce high blood pressure (hypertension). It may also reduce your risk for type 2 diabetes, heart disease, and stroke. The DASH eating plan may also help with weight loss. What are tips for following this plan? General guidelines  Avoid eating more than 2,300 mg (milligrams) of salt (sodium) a day. If you have hypertension, you may need  to reduce your sodium intake to 1,500 mg a day.  Limit alcohol intake to no more than 1 drink a day for nonpregnant women and 2 drinks a day for men. One drink equals 12 oz of beer, 5 oz of wine, or 1 oz of hard liquor.  Work with your health care provider to maintain a healthy body weight or to lose weight. Ask what an ideal weight is for you.  Get at least 30 minutes of exercise that causes your heart to beat faster (aerobic exercise) most days of the week. Activities may include walking, swimming, or biking.  Work with your health care provider or diet and nutrition specialist (dietitian) to adjust your eating plan to your individual calorie needs. Reading food labels  Check food labels for the amount of sodium per serving. Choose foods with less than 5 percent of the Daily Value of sodium. Generally, foods with less than 300 mg of sodium per serving fit into this eating plan.  To find whole grains, look for the word "whole" as the first word in the ingredient list. Shopping  Buy products labeled as "low-sodium" or "no salt added."  Buy fresh foods. Avoid canned foods and premade or frozen meals. Cooking  Avoid adding salt when cooking. Use salt-free seasonings or herbs instead of table salt or sea salt. Check with your health care provider or pharmacist before using salt substitutes.  Do not fry foods. Cook foods using healthy methods such as baking, boiling, grilling, and broiling instead.  Cook with heart-healthy oils, such as  olive, canola, soybean, or sunflower oil. Meal planning   Eat a balanced diet that includes: ? 5 or more servings of fruits and vegetables each day. At each meal, try to fill half of your plate with fruits and vegetables. ? Up to 6-8 servings of whole grains each day. ? Less than 6 oz of lean meat, poultry, or fish each day. A 3-oz serving of meat is about the same size as a deck of cards. One egg equals 1 oz. ? 2 servings of low-fat dairy each day. ? A  serving of nuts, seeds, or beans 5 times each week. ? Heart-healthy fats. Healthy fats called Omega-3 fatty acids are found in foods such as flaxseeds and coldwater fish, like sardines, salmon, and mackerel.  Limit how much you eat of the following: ? Canned or prepackaged foods. ? Food that is high in trans fat, such as fried foods. ? Food that is high in saturated fat, such as fatty meat. ? Sweets, desserts, sugary drinks, and other foods with added sugar. ? Full-fat dairy products.  Do not salt foods before eating.  Try to eat at least 2 vegetarian meals each week.  Eat more home-cooked food and less restaurant, buffet, and fast food.  When eating at a restaurant, ask that your food be prepared with less salt or no salt, if possible. What foods are recommended? The items listed may not be a complete list. Talk with your dietitian about what dietary choices are best for you. Grains Whole-grain or whole-wheat bread. Whole-grain or whole-wheat pasta. Brown rice. [place] Morrow. Bulgur. Whole-grain and low-sodium cereals. Pita bread. Low-fat, low-sodium crackers. Whole-wheat flour tortillas. Vegetables Fresh or frozen vegetables (raw, steamed, roasted, or grilled). Low-sodium or reduced-sodium tomato and vegetable juice. Low-sodium or reduced-sodium tomato sauce and tomato paste. Low-sodium or reduced-sodium canned vegetables. Fruits All fresh, dried, or frozen fruit. Canned fruit in natural juice (without added sugar). Meat and other protein foods Skinless chicken or [place]. Ground chicken or [place]. Pork with fat trimmed off. Fish and seafood. Egg whites. Dried beans, peas, or lentils. Unsalted nuts, nut butters, and seeds. Unsalted canned beans. Lean cuts of beef with fat trimmed off. Low-sodium, lean deli meat. Dairy Low-fat (1%) or fat-free (skim) milk. Fat-free, low-fat, or reduced-fat cheeses. Nonfat, low-sodium ricotta or cottage cheese. Low-fat or nonfat yogurt. Low-fat,  low-sodium cheese. Fats and oils Soft margarine without trans fats. Vegetable oil. Low-fat, reduced-fat, or light mayonnaise and salad dressings (reduced-sodium). Canola, safflower, olive, soybean, and sunflower oils. Avocado. Seasoning and other foods Herbs. Spices. Seasoning mixes without salt. Unsalted popcorn and pretzels. Fat-free sweets. What foods are not recommended? The items listed may not be a complete list. Talk with your dietitian about what dietary choices are best for you. Grains Baked goods made with fat, such as croissants, muffins, or some breads. Dry pasta or rice meal packs. Vegetables Creamed or fried vegetables. Vegetables in a cheese sauce. Regular canned vegetables (not low-sodium or reduced-sodium). Regular canned tomato sauce and paste (not low-sodium or reduced-sodium). Regular tomato and vegetable juice (not low-sodium or reduced-sodium). Angie Fava. Olives. Fruits Canned fruit in a light or heavy syrup. Fried fruit. Fruit in cream or butter sauce. Meat and other protein foods Fatty cuts of meat. Ribs. Fried meat. Berniece Salines. Sausage. Bologna and other processed lunch meats. Salami. Fatback. Hotdogs. Bratwurst. Salted nuts and seeds. Canned beans with added salt. Canned or smoked fish. Whole eggs or egg yolks. Chicken or [place] with skin. Dairy Whole or 2% milk, cream, and  half-and-half. Whole or full-fat cream cheese. Whole-fat or sweetened yogurt. Full-fat cheese. Nondairy creamers. Whipped toppings. Processed cheese and cheese spreads. Fats and oils Butter. Stick margarine. Lard. Shortening. Ghee. Bacon fat. Tropical oils, such as coconut, palm kernel, or palm oil. Seasoning and other foods Salted popcorn and pretzels. Onion salt, garlic salt, seasoned salt, table salt, and sea salt. Worcestershire sauce. Tartar sauce. Barbecue sauce. Teriyaki sauce. Soy sauce, including reduced-sodium. Steak sauce. Canned and packaged gravies. Fish sauce. Oyster sauce. Cocktail sauce.  Horseradish that you find on the shelf. Ketchup. Mustard. Meat flavorings and tenderizers. Bouillon cubes. Hot sauce and Tabasco sauce. Premade or packaged marinades. Premade or packaged taco seasonings. Relishes. Regular salad dressings. Where to find more information:  National Heart, Lung, and [place]: https://wilson-eaton.com/  American Heart Association: www.heart.org Summary  The DASH eating plan is a healthy eating plan that has been shown to reduce high blood pressure (hypertension). It may also reduce your risk for type 2 diabetes, heart disease, and stroke.  With the DASH eating plan, you should limit salt (sodium) intake to 2,300 mg a day. If you have hypertension, you may need to reduce your sodium intake to 1,500 mg a day.  When on the DASH eating plan, aim to eat more fresh fruits and vegetables, whole grains, lean proteins, low-fat dairy, and heart-healthy fats.  Work with your health care provider or diet and nutrition specialist (dietitian) to adjust your eating plan to your individual calorie needs. This information is not intended to replace advice given to you by your health care provider. Make sure you discuss any questions you have with your health care provider. Document Released: 07/16/2011 Document Revised: 07/20/2016 Document Reviewed: 07/20/2016 Elsevier Interactive Patient Education  [employer].

## 2018-07-12 NOTE — Progress Notes (Signed)
Cardiology Office Note   Date:  07/12/2018   ID:  [name], Cynthia 1932/07/16, MRN [id number]  PCP:  Reynold Bowen, MD  Cardiologist:  Mertie Moores, MD EP: None  Chief Complaint  Patient presents with  . Follow-up    CAD and HTN      History of Present Illness: Cynthia [name] is a 84 y.o. female with PMH of CAD s/p CABG 1998, HTN, HLD, DM type 2, and mild bilateral carotid artery disease who presents for routine follow-up of CAD and HTN.  She was last seen by Dr. Acie Fredrickson 01/2018 at which time she was doing well - no anginal complaints and BP log looked good after starting telmasartan. Her last ischemic evaluation was a LHC in 2017 which showed occluded SVG to RCA/PDA, patent SVG to OM2 and SVG to D2 with 30% proximal stenosis, and atretic LIMA; patent p/mLAD stent with minimal in-stent restenosis, and moderately reduced LV function (EF 35-40%). She was recommended for medical management. Follow-up echo in 2018 with EF 55-60%.   Since her last visit 01/2018 she reports doing well. She continues to remain active. Only reports chest pressure when walking quickly while carrying something which resolves with rest. She states this is unchanged over the past year. No need for SL nitro use. She reports eating out frequently but tries to be mindful of sodium in food choices. She denies SOB, DOE, orthopnea, PND, LE edema, dizziness, lightheadedness, or syncope.      Past Medical History:  Diagnosis Date  . Anxiety   . Arthritis   . Carotid bruit    LEFT  . Coronary artery disease    STATUS POST PCI AND LATER CABG  . Diabetes mellitus   . Dyslipidemia   . Dysrhythmia    hx of palpitations and PVC's   . History of chicken pox   . History of echocardiogram    Echo 09/12/2022: mild LVH, EF 55-60, no RWMA, Gr 1 DD, MAC, mild MR, mild LAE, mild TR, PASP 31  . Hypertension   . Measles    hx of   . Mumps    hx of   . Myocardial infarction ([place])   . Palpitations   . PVC's (premature  ventricular contractions)     Past Surgical History:  Procedure Laterality Date  . CARDIAC CATHETERIZATION  06/06/97   IT REVEALA MILD TO MODERATE LEFT VENTRICULAR SYSTOLIC DYSFUNCTION  WITH EF OF 40%. THERE IS PERSISTENT AKINESIS OF THE INFERIOR WALL. THERE IS MILD REGURGITATION PRESENT. THERE IS MILD HYPOKINESIS OF THE ANTERIOR  AND APEX WALL  . CARDIAC CATHETERIZATION N/A 07/27/2016   Procedure: Left Heart Cath and Cors/Grafts Angiography;  Surgeon: Nelva Bush, MD;  Location: [facility];  Service: Cardiovascular;  Laterality: N/A;  . CORONARY ARTERY BYPASS GRAFT    . TONSILLECTOMY    . TOTAL HIP ARTHROPLASTY Right 03/12/2015   Procedure: RIGHT TOTAL HIP ARTHROPLASTY ANTERIOR APPROACH;  Surgeon: Paralee Cancel, MD;  Location: [facility];  Service: Orthopedics;  Laterality: Right;     Current Outpatient Medications  Medication Sig Dispense Refill  . amoxicillin (AMOXIL) 500 MG capsule Take 2,000 mg by mouth See admin instructions. Dor Dental procedures    . aspirin EC 81 MG tablet Take 81 mg by mouth daily.    [name] atorvastatin (LIPITOR) 40 MG tablet Take 1 tablet (40 mg total) by mouth daily. 90 tablet 2  . B-D INS SYR ULTRAFINE 1CC/31G 31G X 5/16" 1 ML MISC     .  BD PEN NEEDLE NANO U/F 32G X 4 MM MISC Inject 1 each into the skin as directed. With insulin  6  . calcium-vitamin D (CALCIUM 500+D) 500-400 MG-UNIT per tablet Take 1 tablet by mouth 2 (two) times daily.     . Coenzyme Q10 (COQ10) 200 MG CAPS Take 1 capsule by mouth every evening.     . diclofenac sodium (VOLTAREN) 1 % GEL APPLY 2 GRAMS TOPICALLY TO RIGHT THUMB AND HAND UP TO 4 TIMES DAILY  1  . ezetimibe (ZETIA) 10 MG tablet TAKE 1 TABLET DAILY 90 tablet 3  . fenofibrate (TRICOR) 145 MG tablet Take 145 mg by mouth daily.    . isosorbide mononitrate (IMDUR) 30 MG 24 hr tablet Take 1 tablet (30 mg total) by mouth daily. 90 tablet 3  . Multiple Vitamin (MULTIVITAMIN) tablet Take 1 tablet by mouth every morning.     .  nitroGLYCERIN (NITROSTAT) 0.4 MG SL tablet Place 1 tablet (0.4 mg total) under the tongue every 5 (five) minutes as needed for chest pain. 25 tablet 6  . NOVOLOG FLEXPEN 100 UNIT/ML FlexPen Inject 3 Units as directed 3 (three) times daily with meals.  4  . ONE TOUCH ULTRA TEST test strip 2 (two) times daily. test blood sugar  5  . ONETOUCH DELICA LANCETS 45Y MISC CHECK BLOOD SUGARS 2 TIMES A DAY  5  . Polyethyl Glycol-Propyl Glycol (SYSTANE OP) Apply 1 drop to eye daily as needed (dry scratchy eyes).    . sitaGLIPtin-metformin (JANUMET) 50-1000 MG tablet Take 1 tablet by mouth 2 (two) times daily with a meal.    . telmisartan (MICARDIS) 20 MG tablet Take 1 tablet (20 mg total) by mouth daily. 90 tablet 3  . TRESIBA FLEXTOUCH 100 UNIT/ML SOPN Inject 10 Units as directed daily.  4  . vitamin C (ASCORBIC ACID) 500 MG tablet Take 500 mg by mouth every evening.      No current facility-administered medications for this visit.     Allergies:   Patient has no known allergies.    Social History:  The patient  reports that she has never smoked. She has never used smokeless tobacco. She reports that she does not drink alcohol or use drugs.   Family History:  The patient's family history includes Cancer in her father; Heart attack in her mother; Stroke in her father.    ROS:  Please see the history of present illness.   Otherwise, review of systems are positive for none.   All other systems are reviewed and negative.    PHYSICAL EXAM: VS:  BP (!) 130/50   Pulse (!) 58   Ht _0  (1.575 m)   Wt 125 lb 12.8 oz (57.1 kg)   SpO2 97%   BMI 23.01 kg/m  , BMI Body mass index is 23.01 kg/m. GEN: Well nourished, well developed, in no acute distress HEENT: sclera anicteric  Neck: no JVD, no carotid bruits appreciated Cardiac: RRR; no murmurs, rubs, or gallops,no edema  Respiratory:  clear to auscultation bilaterally, normal work of breathing GI: soft, nontender, nondistended, + BS MS: no deformity  or atrophy Skin: warm and dry, no rash Neuro:  Strength and sensation are intact Psych: euthymic mood, full affect   EKG:  EKG is ordered today. The ekg ordered today demonstrates sinus bradycardia with sinus arrhythmia and chronic LBBB   Recent Labs: 07/04/2018: ALT 22; BUN 10; Creatinine, Ser 0.72; Potassium 4.1; Sodium 135    Lipid Panel  Component Value Date/Time   CHOL 90 (L) 07/04/2018 0917   TRIG 42 07/04/2018 0917   HDL 37 (L) 07/04/2018 0917   CHOLHDL 2.4 07/04/2018 0917   CHOLHDL 2.5 01/09/2016 0952   VLDL 8 01/09/2016 0952   LDLCALC 45 07/04/2018 0917      Wt Readings from Last 3 Encounters:  07/12/18 125 lb 12.8 oz (57.1 kg)  01/24/18 127 lb 3.2 oz (57.7 kg)  10/22/17 128 lb (58.1 kg)      Other studies Reviewed: Additional studies/ records that were reviewed today include:   Echocardiogram 08/2016: Study Conclusions  - Left ventricle: The cavity size was normal. Wall thickness was   increased in a pattern of mild LVH. Systolic function was normal.   The estimated ejection fraction was in the range of 55% to 60%.   Wall motion was normal; there were no regional wall motion   abnormalities. Doppler parameters are consistent with abnormal   left ventricular relaxation (grade 1 diastolic dysfunction). The   E/e&' ratio is between 8-15 ,suggesting indeterminate LV filling   pressure. - Mitral valve: Calcified annulus. Mildly thickened leaflets .   There was mild regurgitation. - Left atrium: The atrium was mildly dilated. - Tricuspid valve: There was mild regurgitation. - Pulmonary arteries: PA peak pressure: 31 mm Hg (S). - Inferior vena cava: The vessel was normal in size. The   respirophasic diameter changes were in the normal range (= 50%),   consistent with normal central venous pressure.  Impressions:  - LVEF 55-60%, mild LVH, normal wall motion, diastolic dysfunction,   indeterminate LV filling pressure, MAC with mild MR, mild TR,   RVSP  31 mmHg, normal IVC  Left heart catheterization 07/2016: Conclusions: 1. Significant single-vessel coronary artery disease, with ostial occlusion of LCx. 2. Mild to moderate, non-obstructive CAD involving the ostial LMCA and mid RCA.  Proximal/mid LAD stent is patent with minimal in-stent restenosis. 3. Patent SVG to OM2 and SVG to D2.  SVG to D2 demonstrates 30% proximal graft stenosis. 4. Ostially occluded SVG to RCA/PDA. 5. Atretic LIMA. 6. Mildly elevated left ventricular filling pressure. 7. Moderately reduced left ventricular contraction with inferior and apical akinesis (LVEF 35-40%).  Recommendations: 1. Optimize medical therapy, including addition of isosorbide mononitrate and furosemide/KCl. 2. Continue aggressive secondary prevention. 3. Follow-up in the office in ~1 week, with BMET at that time to reassess renal function and potassium.  These findings and recommendations were reviewed with Dr. Acie Fredrickson.  ASSESSMENT AND PLAN:  1. CAD s/p CABG: she reports chronic stable angina which is unchanged over the past year. No SL nitro use.  - Continue aspirin, statin, and imdur  2. HTN: BP log reviewed and generally with SBP <130.  - Continue telmesartan 67m daily - DASH diet encouraged  3. HLD: LDL 45 on lipid panel 06/2018; at goal of <70 - Continue atorvastatin, zetia, and fenofibrate  4. DM type 2: HgbA1C 6.2 04/2018; at goal of <7 - Continue management per PCP.   5. Carotid artery disease: noted to have mild bilateral stenosis on carotid duplex 02/2018 - Continue routine outpatient monitoring - Continue aspirin and statin    Current medicines are reviewed at length with the patient today.  The patient does not have concerns regarding medicines.  The following changes have been made:  no change  Labs/ tests ordered today include: None  Orders Placed This Encounter  Procedures  . EKG 12-Lead     Disposition:   FU with Dr. NAcie Fredrickson  in 6 months  Signed, Abigail Butts, PA-C  07/12/2018 10:57 AM

## 2018-07-14 DIAGNOSIS — H26491 Other secondary cataract, right eye: Secondary | ICD-10-CM | POA: Diagnosis not present

## 2018-07-28 DIAGNOSIS — H26492 Other secondary cataract, left eye: Secondary | ICD-10-CM | POA: Diagnosis not present

## 2018-08-26 DIAGNOSIS — E7849 Other hyperlipidemia: Secondary | ICD-10-CM | POA: Diagnosis not present

## 2018-08-26 DIAGNOSIS — I5189 Other ill-defined heart diseases: Secondary | ICD-10-CM | POA: Diagnosis not present

## 2018-08-26 DIAGNOSIS — E1140 Type 2 diabetes mellitus with diabetic neuropathy, unspecified: Secondary | ICD-10-CM | POA: Diagnosis not present

## 2018-08-26 DIAGNOSIS — M859 Disorder of bone density and structure, unspecified: Secondary | ICD-10-CM | POA: Diagnosis not present

## 2018-08-26 DIAGNOSIS — Z794 Long term (current) use of insulin: Secondary | ICD-10-CM | POA: Diagnosis not present

## 2018-08-26 DIAGNOSIS — Z1331 Encounter for screening for depression: Secondary | ICD-10-CM | POA: Diagnosis not present

## 2018-08-26 DIAGNOSIS — E119 Type 2 diabetes mellitus without complications: Secondary | ICD-10-CM | POA: Diagnosis not present

## 2018-08-26 DIAGNOSIS — D500 Iron deficiency anemia secondary to blood loss (chronic): Secondary | ICD-10-CM | POA: Diagnosis not present

## 2018-08-26 DIAGNOSIS — Z6822 Body mass index (BMI) 22.0-22.9, adult: Secondary | ICD-10-CM | POA: Diagnosis not present

## 2018-08-26 DIAGNOSIS — E871 Hypo-osmolality and hyponatremia: Secondary | ICD-10-CM | POA: Diagnosis not present

## 2018-08-26 DIAGNOSIS — E1142 Type 2 diabetes mellitus with diabetic polyneuropathy: Secondary | ICD-10-CM | POA: Diagnosis not present

## 2018-08-26 DIAGNOSIS — D696 Thrombocytopenia, unspecified: Secondary | ICD-10-CM | POA: Diagnosis not present

## 2018-10-05 DIAGNOSIS — L821 Other seborrheic keratosis: Secondary | ICD-10-CM | POA: Diagnosis not present

## 2018-10-05 DIAGNOSIS — D2262 Melanocytic nevi of left upper limb, including shoulder: Secondary | ICD-10-CM | POA: Diagnosis not present

## 2018-10-05 DIAGNOSIS — D2261 Melanocytic nevi of right upper limb, including shoulder: Secondary | ICD-10-CM | POA: Diagnosis not present

## 2018-10-05 DIAGNOSIS — D2271 Melanocytic nevi of right lower limb, including hip: Secondary | ICD-10-CM | POA: Diagnosis not present

## 2018-10-05 DIAGNOSIS — D225 Melanocytic nevi of trunk: Secondary | ICD-10-CM | POA: Diagnosis not present

## 2018-10-05 DIAGNOSIS — D2272 Melanocytic nevi of left lower limb, including hip: Secondary | ICD-10-CM | POA: Diagnosis not present

## 2018-10-06 ENCOUNTER — Other Ambulatory Visit: Payer: Self-pay | Admitting: Cardiovascular Disease

## 2018-10-12 ENCOUNTER — Other Ambulatory Visit: Payer: Self-pay | Admitting: Cardiovascular Disease

## 2018-10-25 DIAGNOSIS — Z794 Long term (current) use of insulin: Secondary | ICD-10-CM | POA: Diagnosis not present

## 2018-10-25 DIAGNOSIS — I10 Essential (primary) hypertension: Secondary | ICD-10-CM | POA: Diagnosis not present

## 2018-10-25 DIAGNOSIS — E119 Type 2 diabetes mellitus without complications: Secondary | ICD-10-CM | POA: Diagnosis not present

## 2018-10-26 ENCOUNTER — Other Ambulatory Visit: Payer: Self-pay | Admitting: Cardiovascular Disease

## 2018-12-20 DIAGNOSIS — E559 Vitamin D deficiency, unspecified: Secondary | ICD-10-CM | POA: Diagnosis not present

## 2018-12-20 DIAGNOSIS — E042 Nontoxic multinodular goiter: Secondary | ICD-10-CM | POA: Diagnosis not present

## 2018-12-20 DIAGNOSIS — E119 Type 2 diabetes mellitus without complications: Secondary | ICD-10-CM | POA: Diagnosis not present

## 2018-12-21 DIAGNOSIS — R82998 Other abnormal findings in urine: Secondary | ICD-10-CM | POA: Diagnosis not present

## 2018-12-21 DIAGNOSIS — E119 Type 2 diabetes mellitus without complications: Secondary | ICD-10-CM | POA: Diagnosis not present

## 2018-12-26 DIAGNOSIS — E871 Hypo-osmolality and hyponatremia: Secondary | ICD-10-CM | POA: Diagnosis not present

## 2018-12-26 DIAGNOSIS — I10 Essential (primary) hypertension: Secondary | ICD-10-CM | POA: Diagnosis not present

## 2018-12-26 DIAGNOSIS — E785 Hyperlipidemia, unspecified: Secondary | ICD-10-CM | POA: Diagnosis not present

## 2018-12-26 DIAGNOSIS — E042 Nontoxic multinodular goiter: Secondary | ICD-10-CM | POA: Diagnosis not present

## 2018-12-26 DIAGNOSIS — I6523 Occlusion and stenosis of bilateral carotid arteries: Secondary | ICD-10-CM | POA: Diagnosis not present

## 2018-12-26 DIAGNOSIS — D72819 Decreased white blood cell count, unspecified: Secondary | ICD-10-CM | POA: Diagnosis not present

## 2018-12-26 DIAGNOSIS — D696 Thrombocytopenia, unspecified: Secondary | ICD-10-CM | POA: Diagnosis not present

## 2018-12-26 DIAGNOSIS — Z0000 Encounter for general adult medical examination without abnormal findings: Secondary | ICD-10-CM | POA: Diagnosis not present

## 2018-12-26 DIAGNOSIS — R740 Nonspecific elevation of levels of transaminase and lactic acid dehydrogenase [LDH]: Secondary | ICD-10-CM | POA: Diagnosis not present

## 2018-12-26 DIAGNOSIS — E119 Type 2 diabetes mellitus without complications: Secondary | ICD-10-CM | POA: Diagnosis not present

## 2018-12-26 DIAGNOSIS — I5189 Other ill-defined heart diseases: Secondary | ICD-10-CM | POA: Diagnosis not present

## 2018-12-26 DIAGNOSIS — I25810 Atherosclerosis of coronary artery bypass graft(s) without angina pectoris: Secondary | ICD-10-CM | POA: Diagnosis not present

## 2019-01-24 DIAGNOSIS — B353 Tinea pedis: Secondary | ICD-10-CM | POA: Diagnosis not present

## 2019-01-24 DIAGNOSIS — Z794 Long term (current) use of insulin: Secondary | ICD-10-CM | POA: Diagnosis not present

## 2019-01-24 DIAGNOSIS — I10 Essential (primary) hypertension: Secondary | ICD-10-CM | POA: Diagnosis not present

## 2019-01-24 DIAGNOSIS — E119 Type 2 diabetes mellitus without complications: Secondary | ICD-10-CM | POA: Diagnosis not present

## 2019-01-30 ENCOUNTER — Other Ambulatory Visit: Payer: Self-pay | Admitting: Cardiovascular Disease

## 2019-02-12 ENCOUNTER — Other Ambulatory Visit: Payer: Self-pay | Admitting: Cardiovascular Disease

## 2019-02-12 DIAGNOSIS — E785 Hyperlipidemia, unspecified: Secondary | ICD-10-CM

## 2019-04-25 ENCOUNTER — Other Ambulatory Visit: Payer: Self-pay | Admitting: Cardiovascular Disease

## 2019-04-26 DIAGNOSIS — E119 Type 2 diabetes mellitus without complications: Secondary | ICD-10-CM | POA: Diagnosis not present

## 2019-04-26 DIAGNOSIS — I10 Essential (primary) hypertension: Secondary | ICD-10-CM | POA: Diagnosis not present

## 2019-04-26 DIAGNOSIS — Z794 Long term (current) use of insulin: Secondary | ICD-10-CM | POA: Diagnosis not present

## 2019-04-26 DIAGNOSIS — B353 Tinea pedis: Secondary | ICD-10-CM | POA: Diagnosis not present

## 2019-05-01 DIAGNOSIS — E1142 Type 2 diabetes mellitus with diabetic polyneuropathy: Secondary | ICD-10-CM | POA: Diagnosis not present

## 2019-05-01 DIAGNOSIS — I10 Essential (primary) hypertension: Secondary | ICD-10-CM | POA: Diagnosis not present

## 2019-05-01 DIAGNOSIS — E559 Vitamin D deficiency, unspecified: Secondary | ICD-10-CM | POA: Diagnosis not present

## 2019-05-01 DIAGNOSIS — I5189 Other ill-defined heart diseases: Secondary | ICD-10-CM | POA: Diagnosis not present

## 2019-05-01 DIAGNOSIS — E042 Nontoxic multinodular goiter: Secondary | ICD-10-CM | POA: Diagnosis not present

## 2019-05-01 DIAGNOSIS — D72819 Decreased white blood cell count, unspecified: Secondary | ICD-10-CM | POA: Diagnosis not present

## 2019-05-01 DIAGNOSIS — E785 Hyperlipidemia, unspecified: Secondary | ICD-10-CM | POA: Diagnosis not present

## 2019-05-01 DIAGNOSIS — Z794 Long term (current) use of insulin: Secondary | ICD-10-CM | POA: Diagnosis not present

## 2019-05-01 DIAGNOSIS — I25810 Atherosclerosis of coronary artery bypass graft(s) without angina pectoris: Secondary | ICD-10-CM | POA: Diagnosis not present

## 2019-05-01 DIAGNOSIS — I6523 Occlusion and stenosis of bilateral carotid arteries: Secondary | ICD-10-CM | POA: Diagnosis not present

## 2019-05-01 DIAGNOSIS — E1140 Type 2 diabetes mellitus with diabetic neuropathy, unspecified: Secondary | ICD-10-CM | POA: Diagnosis not present

## 2019-05-01 DIAGNOSIS — R740 Nonspecific elevation of levels of transaminase and lactic acid dehydrogenase [LDH]: Secondary | ICD-10-CM | POA: Diagnosis not present

## 2019-05-02 ENCOUNTER — Other Ambulatory Visit: Payer: Self-pay | Admitting: Cardiovascular Disease

## 2019-05-02 MED ORDER — TELMISARTAN 20 MG PO TABS: 20.0000 mg | ORAL_TABLET | Freq: Every day | ORAL | 0 refills | Status: DC

## 2019-05-03 ENCOUNTER — Other Ambulatory Visit: Payer: Self-pay | Admitting: Cardiovascular Disease

## 2019-05-03 DIAGNOSIS — E785 Hyperlipidemia, unspecified: Secondary | ICD-10-CM

## 2019-05-06 ENCOUNTER — Other Ambulatory Visit: Payer: Self-pay | Admitting: Cardiovascular Disease

## 2019-07-03 DIAGNOSIS — Z1231 Encounter for screening mammogram for malignant neoplasm of breast: Secondary | ICD-10-CM | POA: Diagnosis not present

## 2019-07-17 NOTE — Progress Notes (Signed)
Cardiology Office Note:    Date:  07/18/2019   ID:  ALLETTA MATTOS, DOB [birth year], MRN [id number]  PCP:  Reynold Bowen, MD  Cardiologist:  Mertie Moores, MD  Electrophysiologist:  None   Referring MD: Reynold Bowen, MD   Chief Complaint  Patient presents with  . Follow-up    CAD    History of Present Illness:    Cynthia [name] is a 85 y.o. female with:   Coronary artery disease   S/p PCI in 11/1996  S/p CABG in 06/1997  Cath 2017: LCx 100, LAD stent patent; S-OM2, S-D2 patent, S-RPDA 100, L-LAD atretic >> med Rx  EF 35-45% at cath 2017 >> Echocardiogram 08/2016 w normal EF  Hypertension   Hyperlipidemia   Diabetes mellitus 2   Ms. Profit was last seen by Roby Lofts, PA-C in 07/2018.    She returns for follow-up.  She is here alone.  She has done fairly well since last seen.  She does note some exertional chest discomfort when she walks fast.  She describes some mild discomfort.  She has had this feeling for years but thinks it has gradually gotten worse over time.  She has some shortness of breath.  She has not had orthopnea, leg swelling or syncope.   Prior CV studies:   The following studies were reviewed today:  Carotid US 02/14/18 Final Interpretation: Right Carotid: Velocities in the right [name] are consistent with a 1-39% stenosis. Left Carotid: Velocities in the left [name] are consistent with a 1-39% stenosis. Vertebrals:  Bilateral vertebral arteries demonstrate antegrade flow. Subclavians: Normal flow hemodynamics were seen in bilateral subclavian arteries.  Echocardiogram 08/18/2016 Mild LVH, EF 55-60, no RWMA, Gr 1 DD, MAC, mild MR, mild LAE, mild TR, PASP 31  48 Hr Holter 07/2016  NSR  Occasional PVCs  Rare episodes of non-sustained VT.  LHC 07/27/16 LM ostial 30 LAD ostial 20, mid stent patent with 10 ISR LCx ostial 100 CTO RCA mid 87, distal 30 S-RPDA 100 S-D2 proximal 30 S-OM2 patent L-LAD atretic, 100 occluded EF 35-45   Renal Artery Duplex 6/14 Normal caliber abd Aorta Mod aorto-iliac atherosclerosis without stenosis Normal renal arteries, bilaterally  Myoview 11/10 Inf-lat infarct, EF 49; no change from 2004  Echo 11/03 Inf basal AK, mod HK of mid inf wall, EF 55-60, severe MAC, mild to mod MR, mod TR  Past Medical History:  Diagnosis Date  . Anxiety   . Arthritis   . Carotid bruit    LEFT  . Coronary artery disease    STATUS POST PCI AND LATER CABG  . Diabetes mellitus   . Dyslipidemia   . Dysrhythmia    hx of palpitations and PVC's   . History of chicken pox   . History of echocardiogram    Echo 2022/09/25: mild LVH, EF 55-60, no RWMA, Gr 1 DD, MAC, mild MR, mild LAE, mild TR, PASP 31  . Hypertension   . Measles    hx of   . Mumps    hx of   . Myocardial infarction ([place])   . Palpitations   . PVC's (premature ventricular contractions)    Surgical Hx: The patient  has a past surgical history that includes Cardiac catheterization (06/06/97); Coronary artery bypass graft; Tonsillectomy; Total hip arthroplasty (Right, 03/12/2015); and Cardiac catheterization (N/A, 07/27/2016).   Current Medications: Current Meds  Medication Sig  . amoxicillin (AMOXIL) 500 MG capsule Take 2,000 mg by mouth See admin instructions. Dor Dental procedures  .  aspirin EC 81 MG tablet Take 81 mg by mouth daily.  [name] atorvastatin (LIPITOR) 40 MG tablet Take 1 tablet (40 mg total) by mouth daily. Pt needs to call and keep upcoming appt in Dec for further refills  . B-D INS SYR ULTRAFINE 1CC/31G 31G X 5/16" 1 ML MISC   . BD PEN NEEDLE NANO U/F 32G X 4 MM MISC Inject 1 each into the skin as directed. With insulin  . calcium-vitamin D (CALCIUM 500+D) 500-400 MG-UNIT per tablet Take 1 tablet by mouth 2 (two) times daily.   . Coenzyme Q10 (COQ10) 200 MG CAPS Take 1 capsule by mouth every evening.   . diclofenac sodium (VOLTAREN) 1 % GEL APPLY 2 GRAMS TOPICALLY TO RIGHT THUMB AND HAND UP TO 4 TIMES DAILY  . ezetimibe (ZETIA)  10 MG tablet Take 1 tablet (10 mg total) by mouth daily. Pt needs to call and keep upcoming appt in Dec for further refills  . isosorbide mononitrate (IMDUR) 60 MG 24 hr tablet Take 1 tablet (60 mg total) by mouth daily.  . Multiple Vitamin (MULTIVITAMIN) tablet Take 1 tablet by mouth every morning.   . nitroGLYCERIN (NITROSTAT) 0.4 MG SL tablet Place 1 tablet (0.4 mg total) under the tongue every 5 (five) minutes as needed for chest pain.  [name] NOVOLOG FLEXPEN 100 UNIT/ML FlexPen Inject 3 Units as directed 3 (three) times daily with meals.  . ONE TOUCH ULTRA TEST test strip 2 (two) times daily. test blood sugar  . ONETOUCH DELICA LANCETS 93G MISC CHECK BLOOD SUGARS 2 TIMES A DAY  . Polyethyl Glycol-Propyl Glycol (SYSTANE OP) Apply 1 drop to eye daily as needed (dry scratchy eyes).  . sitaGLIPtin-metformin (JANUMET) 50-1000 MG tablet Take 1 tablet by mouth 2 (two) times daily with a meal.  . telmisartan (MICARDIS) 20 MG tablet Take 1 tablet (20 mg total) by mouth daily. Please keep upcoming appt in December for future refills. Thank you  . TRESIBA FLEXTOUCH 100 UNIT/ML SOPN Inject 10 Units as directed daily.  . vitamin C (ASCORBIC ACID) 500 MG tablet Take 500 mg by mouth every evening.   . [DISCONTINUED] isosorbide mononitrate (IMDUR) 30 MG 24 hr tablet TAKE 1 TABLET BY MOUTH EVERY DAY  . [DISCONTINUED] nitroGLYCERIN (NITROSTAT) 0.4 MG SL tablet Place 1 tablet (0.4 mg total) under the tongue every 5 (five) minutes as needed for chest pain.     Allergies:   Patient has no known allergies.   Social History   Tobacco Use  . Smoking status: Never Smoker  . Smokeless tobacco: Never Used  Substance Use Topics  . Alcohol use: No  . Drug use: No     Family Hx: The patient's family history includes Cancer in her father; Heart attack in her mother; Stroke in her father.  ROS:   Please see the history of present illness.    ROS All other systems reviewed and are negative.   EKGs/Labs/Other Test  Reviewed:    EKG:  EKG is ordered today and demonstrates normal sinus rhythm, heart rate 71, interventricular conduction delay, QRS 150, T wave inversions 2, 3, aVF, V5-V6, QTC 517, no change from prior tracing   Recent Labs: No results found for requested labs within last 8760 hours.   Recent Lipid Panel Lab Results  Component Value Date/Time   CHOL 90 (L) 07/04/2018 09:17 AM   TRIG 42 07/04/2018 09:17 AM   HDL 37 (L) 07/04/2018 09:17 AM   CHOLHDL 2.4 07/04/2018 09:17 AM  CHOLHDL 2.5 01/09/2016 09:52 AM   LDLCALC 45 07/04/2018 09:17 AM     Physical Exam:    VS:  BP 128/68   Pulse 71   Ht _0  (1.575 m)   Wt 124 lb (56.2 kg)   SpO2 99%   BMI 22.68 kg/m     Wt Readings from Last 3 Encounters:  07/18/19 124 lb (56.2 kg)  07/12/18 125 lb 12.8 oz (57.1 kg)  01/24/18 127 lb 3.2 oz (57.7 kg)     Physical Exam  Constitutional: She is oriented to person, place, and time. She appears well-developed and well-nourished. No distress.  HENT:  Head: Normocephalic and atraumatic.  Eyes: No scleral icterus.  Neck: No JVD present. No thyromegaly present.  Cardiovascular: Normal rate, regular rhythm and normal heart sounds.  No murmur heard. Pulmonary/Chest: Effort normal and breath sounds normal. She has no rales.  Abdominal: Soft. There is no hepatomegaly.  Musculoskeletal:        General: No edema.  Lymphadenopathy:    She has no cervical adenopathy.  Neurological: She is alert and oriented to person, place, and time.  Skin: Skin is warm and dry.  Psychiatric: She has a normal mood and affect.    ASSESSMENT & PLAN:    1. Coronary artery disease involving native coronary artery of native heart with angina pectoris ([place]) History of PCI in 1998 and subsequent CABG.  Cardiac catheterization in December 2017 demonstrated patent LAD stent and 2 out of 4 grafts patent.  She has been managed medically.  She does have mild symptoms consistent with CCS class II angina.  I have  recommended increasing her isosorbide to 60 mg daily.  Continue current dose of aspirin, atorvastatin, ezetimibe.  Follow-up in 3 months to assess response to therapy.  2. Essential hypertension She has noted some elevated blood pressures at home.  I have asked her to monitor her blood pressure for the next few weeks and send those readings for review.  If her pressure remains above target despite increasing her isosorbide, we could consider increasing her telmisartan dose to 40 mg daily.  3. Mixed hyperlipidemia LDL 51 in May 2020.  Continue current dose of atorvastatin and ezetimibe.   Dispo:  Return in about 3 months (around 10/16/2019) for Routine Follow Up, w/ Dr. Acie Fredrickson, or Richardson Dopp, PA-C, via Telemedicine.   Medication Adjustments/Labs and Tests Ordered: Current medicines are reviewed at length with the patient today.  Concerns regarding medicines are outlined above.  Tests Ordered: Orders Placed This Encounter  Procedures  . EKG 12-Lead   Medication Changes: Meds ordered this encounter  Medications  . nitroGLYCERIN (NITROSTAT) 0.4 MG SL tablet    Sig: Place 1 tablet (0.4 mg total) under the tongue every 5 (five) minutes as needed for chest pain.    Dispense:  25 tablet    Refill:  3  . isosorbide mononitrate (IMDUR) 60 MG 24 hr tablet    Sig: Take 1 tablet (60 mg total) by mouth daily.    Dispense:  90 tablet    Refill:  2    Signed, Richardson Dopp, PA-C  07/18/2019 5:29 PM    [facility] [address], [place], [place]  [address] Phone: [phone]; Fax: [phone]

## 2019-07-18 ENCOUNTER — Encounter: Payer: Self-pay | Admitting: Physician Assistant

## 2019-07-18 ENCOUNTER — Ambulatory Visit (INDEPENDENT_AMBULATORY_CARE_PROVIDER_SITE_OTHER): Payer: Medicare Other | Admitting: Physician Assistant

## 2019-07-18 ENCOUNTER — Other Ambulatory Visit: Payer: Self-pay

## 2019-07-18 VITALS — BP 128/68 | HR 71 | Ht 62.0 in | Wt 124.0 lb

## 2019-07-18 DIAGNOSIS — E782 Mixed hyperlipidemia: Secondary | ICD-10-CM | POA: Diagnosis not present

## 2019-07-18 DIAGNOSIS — I10 Essential (primary) hypertension: Secondary | ICD-10-CM | POA: Diagnosis not present

## 2019-07-18 DIAGNOSIS — I25119 Atherosclerotic heart disease of native coronary artery with unspecified angina pectoris: Secondary | ICD-10-CM | POA: Diagnosis not present

## 2019-07-18 MED ORDER — NITROGLYCERIN 0.4 MG SL SUBL: 0.4000 mg | SUBLINGUAL_TABLET | SUBLINGUAL | 3 refills | Status: DC | PRN

## 2019-07-18 MED ORDER — ISOSORBIDE MONONITRATE ER 60 MG PO TB24: 60.0000 mg | ORAL_TABLET | Freq: Every day | ORAL | 2 refills | Status: DC

## 2019-07-18 NOTE — Patient Instructions (Signed)
Medication Instructions:   START TAKING: IMDUR 60 MG ONCE  A DAY    *If you need a refill on your cardiac medications before your next appointment, please call your pharmacy*  Lab Work: NONE ORDERED  TODAY   If you have labs (blood work) drawn today and your tests are completely normal, you will receive your results only by: [name] MyChart Message (if you have MyChart) OR . A paper copy in the mail If you have any lab test that is abnormal or we need to change your treatment, we will call you to review the results.  Testing/Procedures: NONE ORDERED  TODAY    Follow-Up: At [facility], you and your health needs are our priority.  As part of our continuing mission to provide you with exceptional heart care, we have created designated Provider Care Teams.  These Care Teams include your primary Cardiologist (physician) and Advanced Practice Providers (APPs -  Physician Assistants and Nurse Practitioners) who all work together to provide you with the care you need, when you need it.  Your next appointment:   3 month(s)  The format for your next appointment:   Virtual Visit   Provider:   You may see Mertie Moores, MD or one of the following Advanced Practice Providers on your designated Care Team:  Richardson Dopp, PA-C   Other Instructions  MAKE SURE TO WAIT ABOUT 10-15 MINUTES BEFORE TAKING BLOOD PRESSURE WHEN TAKING

## 2019-07-22 ENCOUNTER — Other Ambulatory Visit: Payer: Self-pay | Admitting: Cardiovascular Disease

## 2019-07-22 DIAGNOSIS — E785 Hyperlipidemia, unspecified: Secondary | ICD-10-CM

## 2019-07-24 ENCOUNTER — Other Ambulatory Visit: Payer: Self-pay | Admitting: Cardiovascular Disease

## 2019-07-25 ENCOUNTER — Other Ambulatory Visit: Payer: Self-pay | Admitting: Cardiovascular Disease

## 2019-08-09 DIAGNOSIS — E119 Type 2 diabetes mellitus without complications: Secondary | ICD-10-CM | POA: Diagnosis not present

## 2019-08-09 DIAGNOSIS — I10 Essential (primary) hypertension: Secondary | ICD-10-CM | POA: Diagnosis not present

## 2019-08-09 DIAGNOSIS — Z794 Long term (current) use of insulin: Secondary | ICD-10-CM | POA: Diagnosis not present

## 2019-08-29 DIAGNOSIS — E119 Type 2 diabetes mellitus without complications: Secondary | ICD-10-CM | POA: Diagnosis not present

## 2019-08-29 DIAGNOSIS — E785 Hyperlipidemia, unspecified: Secondary | ICD-10-CM | POA: Diagnosis not present

## 2019-08-29 DIAGNOSIS — I5189 Other ill-defined heart diseases: Secondary | ICD-10-CM | POA: Diagnosis not present

## 2019-08-29 DIAGNOSIS — D500 Iron deficiency anemia secondary to blood loss (chronic): Secondary | ICD-10-CM | POA: Diagnosis not present

## 2019-08-29 DIAGNOSIS — E1142 Type 2 diabetes mellitus with diabetic polyneuropathy: Secondary | ICD-10-CM | POA: Diagnosis not present

## 2019-08-29 DIAGNOSIS — I25810 Atherosclerosis of coronary artery bypass graft(s) without angina pectoris: Secondary | ICD-10-CM | POA: Diagnosis not present

## 2019-08-29 DIAGNOSIS — E871 Hypo-osmolality and hyponatremia: Secondary | ICD-10-CM | POA: Diagnosis not present

## 2019-08-29 DIAGNOSIS — M810 Age-related osteoporosis without current pathological fracture: Secondary | ICD-10-CM | POA: Diagnosis not present

## 2019-08-29 DIAGNOSIS — N08 Glomerular disorders in diseases classified elsewhere: Secondary | ICD-10-CM | POA: Diagnosis not present

## 2019-08-29 DIAGNOSIS — E042 Nontoxic multinodular goiter: Secondary | ICD-10-CM | POA: Diagnosis not present

## 2019-08-29 DIAGNOSIS — E559 Vitamin D deficiency, unspecified: Secondary | ICD-10-CM | POA: Diagnosis not present

## 2019-08-29 DIAGNOSIS — Z794 Long term (current) use of insulin: Secondary | ICD-10-CM | POA: Diagnosis not present

## 2019-09-07 DIAGNOSIS — Z23 Encounter for immunization: Secondary | ICD-10-CM | POA: Diagnosis not present

## 2019-10-03 ENCOUNTER — Other Ambulatory Visit: Payer: Self-pay | Admitting: Cardiovascular Disease

## 2019-10-05 DIAGNOSIS — Z23 Encounter for immunization: Secondary | ICD-10-CM | POA: Diagnosis not present

## 2019-10-13 ENCOUNTER — Other Ambulatory Visit: Payer: Self-pay | Admitting: Cardiovascular Disease

## 2019-10-13 DIAGNOSIS — E785 Hyperlipidemia, unspecified: Secondary | ICD-10-CM

## 2019-10-16 DIAGNOSIS — D2261 Melanocytic nevi of right upper limb, including shoulder: Secondary | ICD-10-CM | POA: Diagnosis not present

## 2019-10-16 DIAGNOSIS — L814 Other melanin hyperpigmentation: Secondary | ICD-10-CM | POA: Diagnosis not present

## 2019-10-16 DIAGNOSIS — D044 Carcinoma in situ of skin of scalp and neck: Secondary | ICD-10-CM | POA: Diagnosis not present

## 2019-10-16 DIAGNOSIS — L820 Inflamed seborrheic keratosis: Secondary | ICD-10-CM | POA: Diagnosis not present

## 2019-10-16 DIAGNOSIS — D2262 Melanocytic nevi of left upper limb, including shoulder: Secondary | ICD-10-CM | POA: Diagnosis not present

## 2019-10-16 DIAGNOSIS — L821 Other seborrheic keratosis: Secondary | ICD-10-CM | POA: Diagnosis not present

## 2019-10-16 NOTE — Progress Notes (Signed)
Virtual Visit via Telephone Note   This visit type was conducted due to national recommendations for restrictions regarding the COVID-19 Pandemic (e.g. social distancing) in an effort to limit this patient's exposure and mitigate transmission in our community.  Due to her co-morbid illnesses, this patient is at least at moderate risk for complications without adequate follow up.  This format is felt to be most appropriate for this patient at this time.  The patient did not have access to video technology/had technical difficulties with video requiring transitioning to audio format only (telephone).  All issues noted in this document were discussed and addressed.  No physical exam could be performed with this format.  Please refer to the patient's chart for her  consent to telehealth for [facility]   The patient was identified using 2 identifiers.  Date:  10/17/2019   ID:  Cynthia [name], DOB [birth year], MRN [id number]  Patient Location: Home Provider Location: Office  PCP:  Reynold Bowen, MD  Cardiologist:  Mertie Moores, MD  Electrophysiologist:  None    1. Coronary artery disease-status post PCI( April , 1998) and CABG ( Nov. 1998) 2. Dyslipidemia 3. Diabetes mellitus 4. Left carotid bruit 5. Right hip arthritis     Previous notes:   Cynthia [name] is a 86 y.o. y.o. female with a history of coronary artery disease. She status post PCI and then later had coronary artery bypass grafting. She also has a history of dyslipidemia, diabetes mellitus, and a left carotid bruit.  She has done well from a cardiac standpoint. She has not been exercising as much as she has in the past. She complains of generalized fatigue. She stays busy doing chores. 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  January 16, 2013:  Cynthia [name] is dong well. She is getting over several viral infections over the past few months. No CP.   Dec. 22,2014:  Cynthia [name] has had a rough summer. Also has  had a cold. Her husband has been at [facility] since early Sept. No cardiac issues.   January 08, 2014:  Cynthia [name] is doing ok. She has lost lots of weight over the past year or so and now is gaining some weight back. Her has an abdominal bruit. Renal artery duplex study was normal.   She has started insulin therapy.   Dec. 1, 2015:  Cynthia [name] is an 86 yo who I follow for CAD, dyslipidema.  She has had some BP variablility.  No CP,  Has not been exercising. Has hip / back problems.  Her husband passed away this past 2023/01/29.    January 11, 2015:  Cynthia [name] is a 86 y.o. female who presents for  Follow up of her CAD and HTN. Having lots of pain this am - as a result , her BP is high  May need to have hip surgery    Dec. 2, 2016:  Doing well.  No cardiac issues. Had her R hip replacement.  Cynthia [name] and broke her left arm while using her walker.   Bp and HR are well controlled.    January 09, 2016: Doing well.  No CP or dyspnea. Does not have the endurance that she used to have   Dec. 14, 2017  Having more DOE and chest pressure with exertion. Similar to prior to her CABG  Has not tried a SL NTG  Her symptoms started 3 weeks ago. Now she cannot do any exercise before she gets some  chest tightness.   Jan. 25 2018: She presented to the office with angina in mid December. She was sent to the hospital and had a heart catheterization. She was found to have an ostial occlusion of the left circumflex artery. The LIMA to LAD was patent but the IMA was atretic.[name] She has a proximal/mid LAD stent that is patent. There is a patent saphenous vein graft to the obtuse marginal artery and a patent saphenous vein graft to the second diagonal. The saphenous vein graft to the right coronary artery is occluded.  She had moderate left ventricle systolic function by cath  with an EF of 35-40%. She had inferior apical akinesis.  Echocardiogram revealed normal left ventricle  systolic function. She has mild tricuspid regurgitation and mild mitral regurgitation.  She seems to be feeling better.    No further episodes of CP or dyspnea.    Aug. 17, 2018:   . Has some rare episodes of chest tightness ( especially when carrying heavy objects )   October 22, 2017: He is seen back today for follow-up of her coronary artery disease, dyslipidemia, and peripheral vascular disease. She has noticed some chest pressure if she waks too fast  - especialy if she is carrying something .   Last cath was in Dec. 2017.   She thinks her symptoms have worsened since that time  Was on Valsartan - was recalled,    January 24, 2018:   Cynthia [name] is seen today for follow-up of her coronary artery disease, diabetes mellitus and hyperlipidemia. Is now on Telmasartan  Brought her BP log,  Most readings are good  No cp or dyspnea  Evaluation Performed:  Follow-Up Visit  Chief Complaint:  CAD , HTN  October 17, 2019    Cynthia [name] is a 86 y.o. female with  CAD. She has been seen by Richardson Dopp, PA and Roby Lofts, [place] since I last saw her in 2019.  Has been doing well.  No covid issues.  Has had 2 injections  No CP or dyspnea  BP has been a little higher .  Was on fenofibrate but this was stopped by her primary md Lipids managed by primary md    The patient does not have symptoms concerning for COVID-19 infection (fever, chills, cough, or new shortness of breath).    Past Medical History:  Diagnosis Date  . Anxiety   . Arthritis   . Carotid bruit    LEFT  . Coronary artery disease    STATUS POST PCI AND LATER CABG  . Diabetes mellitus   . Dyslipidemia   . Dysrhythmia    hx of palpitations and PVC's   . History of chicken pox   . History of echocardiogram    Echo 09-Sep-2022: mild LVH, EF 55-60, no RWMA, Gr 1 DD, MAC, mild MR, mild LAE, mild TR, PASP 31  . Hypertension   . Measles    hx of   . Mumps    hx of   . Myocardial infarction ([place])   . Palpitations   .  PVC's (premature ventricular contractions)    Past Surgical History:  Procedure Laterality Date  . CARDIAC CATHETERIZATION  06/06/97   IT REVEALA MILD TO MODERATE LEFT VENTRICULAR SYSTOLIC DYSFUNCTION  WITH EF OF 40%. THERE IS PERSISTENT AKINESIS OF THE INFERIOR WALL. THERE IS MILD REGURGITATION PRESENT. THERE IS MILD HYPOKINESIS OF THE ANTERIOR  AND APEX WALL  . CARDIAC CATHETERIZATION N/A 07/27/2016   Procedure: Left Heart Cath  and Cors/Grafts Angiography;  Surgeon: Nelva Bush, MD;  Location: [facility];  Service: Cardiovascular;  Laterality: N/A;  . CORONARY ARTERY BYPASS GRAFT    . TONSILLECTOMY    . TOTAL HIP ARTHROPLASTY Right 03/12/2015   Procedure: RIGHT TOTAL HIP ARTHROPLASTY ANTERIOR APPROACH;  Surgeon: Paralee Cancel, MD;  Location: [facility];  Service: Orthopedics;  Laterality: Right;     Current Meds  Medication Sig  . amoxicillin (AMOXIL) 500 MG capsule Take 2,000 mg by mouth See admin instructions. Dor Dental procedures  . aspirin EC 81 MG tablet Take 81 mg by mouth daily.  [name] atorvastatin (LIPITOR) 40 MG tablet TAKE 1 TABLET DAILY  . B-D INS SYR ULTRAFINE 1CC/31G 31G X 5/16" 1 ML MISC   . BD PEN NEEDLE NANO U/F 32G X 4 MM MISC Inject 1 each into the skin as directed. With insulin  . calcium-vitamin D (CALCIUM 500+D) 500-400 MG-UNIT per tablet Take 1 tablet by mouth 2 (two) times daily.   . Coenzyme Q10 (COQ10) 200 MG CAPS Take 1 capsule by mouth every evening.   . ezetimibe (ZETIA) 10 MG tablet TAKE 1 TABLET DAILY  . insulin aspart (NOVOLOG FLEXPEN) 100 UNIT/ML FlexPen Inject 4 Units into the skin 3 (three) times daily with meals.  . insulin degludec (TRESIBA FLEXTOUCH) 100 UNIT/ML FlexTouch Pen Inject 11 Units into the skin daily.  . isosorbide mononitrate (IMDUR) 60 MG 24 hr tablet Take 1 tablet (60 mg total) by mouth daily.  . Multiple Vitamin (MULTIVITAMIN) tablet Take 1 tablet by mouth every morning.   . nitroGLYCERIN (NITROSTAT) 0.4 MG SL tablet Place 1 tablet  (0.4 mg total) under the tongue every 5 (five) minutes as needed for chest pain.  . ONE TOUCH ULTRA TEST test strip 2 (two) times daily. test blood sugar  . ONETOUCH DELICA LANCETS 09T MISC CHECK BLOOD SUGARS 2 TIMES A DAY  . Polyethyl Glycol-Propyl Glycol (SYSTANE OP) Apply 1 drop to eye daily as needed (dry scratchy eyes).  . sitaGLIPtin-metformin (JANUMET) 50-1000 MG tablet Take 1 tablet by mouth 2 (two) times daily with a meal.  . telmisartan (MICARDIS) 20 MG tablet TAKE 1 TABLET BY MOUTH EVERY DAY  . vitamin C (ASCORBIC ACID) 500 MG tablet Take 500 mg by mouth every evening.      Allergies:   Patient has no known allergies.   Social History   Tobacco Use  . Smoking status: Never Smoker  . Smokeless tobacco: Never Used  Substance Use Topics  . Alcohol use: No  . Drug use: No     Family Hx: The patient's family history includes Cancer in her father; Heart attack in her mother; Stroke in her father.  ROS:   Please see the history of present illness.     All other systems reviewed and are negative.   Prior CV studies:   The following studies were reviewed today:    Labs/Other Tests and Data Reviewed:    EKG:    Recent Labs: No results found for requested labs within last 8760 hours.   Recent Lipid Panel Lab Results  Component Value Date/Time   CHOL 90 (L) 07/04/2018 09:17 AM   TRIG 42 07/04/2018 09:17 AM   HDL 37 (L) 07/04/2018 09:17 AM   CHOLHDL 2.4 07/04/2018 09:17 AM   CHOLHDL 2.5 01/09/2016 09:52 AM   LDLCALC 45 07/04/2018 09:17 AM    Wt Readings from Last 3 Encounters:  10/17/19 121 lb (54.9 kg)  07/18/19 124 lb (56.2  kg)  07/12/18 125 lb 12.8 oz (57.1 kg)     Objective:    Vital Signs:  BP (!) 154/62 Comment: 7am, before brkfst  Pulse (!) 52   Ht _0  (1.575 m)   Wt 121 lb (54.9 kg)   BMI 22.13 kg/m    Telephone visit - no exam available   ASSESSMENT & PLAN:    1. CAD :  No angnia   2.  Essential HTN:   BP is slightly elevated.  We  will add hydrochlorothiazide 12.5 mg a day.  We will consider increasing the dose or changing to chlorthalidone if her blood pressure remains elevated.  I have reminded her to watch her salt intake.    COVID-19 Education: The signs and symptoms of COVID-19 were discussed with the patient and how to seek care for testing (follow up with PCP or arrange E-visit).  The importance of social distancing was discussed today.  Time:   Today, I have spent  18 minutes with the patient with telehealth technology discussing the above problems.     Medication Adjustments/Labs and Tests Ordered: Current medicines are reviewed at length with the patient today.  Concerns regarding medicines are outlined above.   Tests Ordered: No orders of the defined types were placed in this encounter.   Medication Changes: No orders of the defined types were placed in this encounter.   Follow Up:  Either In Person or Virtual in 6 month(s)  Signed, Mertie Moores, MD  10/17/2019 11:26 AM    [place]

## 2019-10-17 ENCOUNTER — Other Ambulatory Visit: Payer: Self-pay

## 2019-10-17 ENCOUNTER — Telehealth (INDEPENDENT_AMBULATORY_CARE_PROVIDER_SITE_OTHER): Payer: Medicare Other | Admitting: Cardiovascular Disease

## 2019-10-17 ENCOUNTER — Encounter: Payer: Self-pay | Admitting: Cardiovascular Disease

## 2019-10-17 VITALS — BP 154/62 | HR 52 | Ht 62.0 in | Wt 121.0 lb

## 2019-10-17 DIAGNOSIS — I2510 Atherosclerotic heart disease of native coronary artery without angina pectoris: Secondary | ICD-10-CM | POA: Diagnosis not present

## 2019-10-17 DIAGNOSIS — I10 Essential (primary) hypertension: Secondary | ICD-10-CM

## 2019-10-17 DIAGNOSIS — E785 Hyperlipidemia, unspecified: Secondary | ICD-10-CM

## 2019-10-17 DIAGNOSIS — E782 Mixed hyperlipidemia: Secondary | ICD-10-CM

## 2019-10-17 MED ORDER — HYDROCHLOROTHIAZIDE 12.5 MG PO CAPS: 12.5000 mg | ORAL_CAPSULE | Freq: Every day | ORAL | 11 refills | Status: DC

## 2019-10-17 NOTE — Patient Instructions (Addendum)
Medication Instructions:  Your physician has recommended you make the following change in your medication:  START HCTZ (Hydrochlorothiazide) 12.5 mg once daily  *If you need a refill on your cardiac medications before your next appointment, please call your pharmacy*   Lab Work: Your physician recommends that you return for lab work in: 3 weeks for basic metabolic panel on Tuesday March 30 You do not have to FAST for this appointment  If you have labs (blood work) drawn today and your tests are completely normal, you will receive your results only by: [name] MyChart Message (if you have MyChart) OR . A paper copy in the mail If you have any lab test that is abnormal or we need to change your treatment, we will call you to review the results.   Testing/Procedures: None Ordered   Follow-Up: At [facility], you and your health needs are our priority.  As part of our continuing mission to provide you with exceptional heart care, we have created designated Provider Care Teams.  These Care Teams include your primary Cardiologist (physician) and Advanced Practice Providers (APPs -  Physician Assistants and Nurse Practitioners) who all work together to provide you with the care you need, when you need it.  We recommend signing up for the patient portal called "MyChart".  Sign up information is provided on this After Visit Summary.  MyChart is used to connect with patients for Virtual Visits (Telemedicine).  Patients are able to view lab/test results, encounter notes, upcoming appointments, etc.  Non-urgent messages can be sent to your provider as well.   To learn more about what you can do with MyChart, go to [email or web address].    Your next appointment:   6 month(s)  The format for your next appointment:   In Person  Provider:   You may see Mertie Moores, MD or one of the following Advanced Practice Providers on your designated Care Team:    Richardson Dopp, PA-C  [place],  [place]  Daune Perch, [place]

## 2019-11-07 ENCOUNTER — Other Ambulatory Visit: Payer: Self-pay

## 2019-11-07 ENCOUNTER — Other Ambulatory Visit: Payer: Medicare Other

## 2019-11-07 DIAGNOSIS — I2510 Atherosclerotic heart disease of native coronary artery without angina pectoris: Secondary | ICD-10-CM | POA: Diagnosis not present

## 2019-11-07 DIAGNOSIS — I10 Essential (primary) hypertension: Secondary | ICD-10-CM

## 2019-11-07 DIAGNOSIS — E782 Mixed hyperlipidemia: Secondary | ICD-10-CM

## 2019-11-07 LAB — BASIC METABOLIC PANEL
BUN/Creatinine Ratio: 16 (ref 12–28)
BUN: 9 mg/dL (ref 8–27)
CO2: 22 mmol/L (ref 20–29)
Calcium: 9.1 mg/dL (ref 8.7–10.3)
Chloride: 88 mmol/L — ABNORMAL LOW (ref 96–106)
Creatinine, Ser: 0.57 mg/dL (ref 0.57–1.00)
GFR calc Af Amer: 98 mL/min/{1.73_m2} (ref >59)
GFR calc non Af Amer: 85 mL/min/{1.73_m2} (ref >59)
Glucose: 195 mg/dL — ABNORMAL HIGH (ref 65–99)
Potassium: 4.6 mmol/L (ref 3.5–5.2)
Sodium: 124 mmol/L — ABNORMAL LOW (ref 134–144)

## 2019-11-09 ENCOUNTER — Telehealth: Payer: Self-pay | Admitting: Nurse Practitioner

## 2019-11-09 DIAGNOSIS — E119 Type 2 diabetes mellitus without complications: Secondary | ICD-10-CM | POA: Diagnosis not present

## 2019-11-09 DIAGNOSIS — I10 Essential (primary) hypertension: Secondary | ICD-10-CM | POA: Diagnosis not present

## 2019-11-09 DIAGNOSIS — Z794 Long term (current) use of insulin: Secondary | ICD-10-CM | POA: Diagnosis not present

## 2019-11-09 DIAGNOSIS — N08 Glomerular disorders in diseases classified elsewhere: Secondary | ICD-10-CM | POA: Diagnosis not present

## 2019-11-09 MED ORDER — AMLODIPINE BESYLATE 2.5 MG PO TABS: 2.5000 mg | ORAL_TABLET | Freq: Every day | ORAL | 11 refills | Status: DC

## 2019-11-09 NOTE — Telephone Encounter (Signed)
Reviewed lab results and plan to start amlodipine 2.5 mg daily with patient who verbalized understanding and agreement. She has been checking her BP soon after she wakes up. States BP was 120/60 at doctor appointment today. I advised that it was likely due to her having taken her medications before the time the BP was taken at office. I advised her to monitor home BP 1-2 hours after she takes her medications.  I scheduled her to see Dr. Acie Fredrickson on 5/7 and advised her that she will be due for lab work. She agrees to come in fasting or near fasting and understands she will see Dr. Acie Fredrickson first and then get lab work. I advised her to call back prior to appointment if she has any questions or concerns. She thanked me for the call.

## 2019-11-09 NOTE — Telephone Encounter (Signed)
-----   Message from Thayer Headings, MD sent at 11/09/2019  3:24 PM EDT ----- DC HCTZ Start amlodipine 2.5 mg a day

## 2019-12-15 ENCOUNTER — Encounter: Payer: Self-pay | Admitting: Cardiovascular Disease

## 2019-12-15 ENCOUNTER — Ambulatory Visit (INDEPENDENT_AMBULATORY_CARE_PROVIDER_SITE_OTHER): Payer: Medicare Other | Admitting: Cardiovascular Disease

## 2019-12-15 ENCOUNTER — Other Ambulatory Visit: Payer: Self-pay

## 2019-12-15 VITALS — BP 156/74 | HR 80 | Ht 62.0 in | Wt 121.0 lb

## 2019-12-15 DIAGNOSIS — I10 Essential (primary) hypertension: Secondary | ICD-10-CM | POA: Diagnosis not present

## 2019-12-15 DIAGNOSIS — E119 Type 2 diabetes mellitus without complications: Secondary | ICD-10-CM | POA: Diagnosis not present

## 2019-12-15 DIAGNOSIS — E782 Mixed hyperlipidemia: Secondary | ICD-10-CM

## 2019-12-15 DIAGNOSIS — I2510 Atherosclerotic heart disease of native coronary artery without angina pectoris: Secondary | ICD-10-CM | POA: Diagnosis not present

## 2019-12-15 DIAGNOSIS — R0989 Other specified symptoms and signs involving the circulatory and respiratory systems: Secondary | ICD-10-CM | POA: Diagnosis not present

## 2019-12-15 LAB — BASIC METABOLIC PANEL
BUN/Creatinine Ratio: 23 (ref 12–28)
BUN: 14 mg/dL (ref 8–27)
CO2: 21 mmol/L (ref 20–29)
Calcium: 9.3 mg/dL (ref 8.7–10.3)
Chloride: 95 mmol/L — ABNORMAL LOW (ref 96–106)
Creatinine, Ser: 0.62 mg/dL (ref 0.57–1.00)
GFR calc Af Amer: 95 mL/min/{1.73_m2} (ref >59)
GFR calc non Af Amer: 82 mL/min/{1.73_m2} (ref >59)
Glucose: 152 mg/dL — ABNORMAL HIGH (ref 65–99)
Potassium: 4.6 mmol/L (ref 3.5–5.2)
Sodium: 133 mmol/L — ABNORMAL LOW (ref 134–144)

## 2019-12-15 LAB — HEPATIC FUNCTION PANEL
ALT: 22 IU/L (ref 0–32)
AST: 31 IU/L (ref 0–40)
Albumin: 4.5 g/dL (ref 3.6–4.6)
Alkaline Phosphatase: 82 IU/L (ref 39–117)
Bilirubin Total: 1.1 mg/dL (ref 0.0–1.2)
Bilirubin, Direct: 0.33 mg/dL (ref 0.00–0.40)
Total Protein: 6.7 g/dL (ref 6.0–8.5)

## 2019-12-15 LAB — LIPID PANEL
Chol/HDL Ratio: 2.2 ratio (ref 0.0–4.4)
Cholesterol, Total: 100 mg/dL (ref 100–199)
HDL: 45 mg/dL (ref >39)
LDL Chol Calc (NIH): 44 mg/dL (ref 0–99)
Triglycerides: 44 mg/dL (ref 0–149)
VLDL Cholesterol Cal: 11 mg/dL (ref 5–40)

## 2019-12-15 NOTE — Progress Notes (Signed)
Cardiology Office Note   Date:  12/15/2019   ID:  Cynthia, [name] [birth year], MRN [id number]  PCP:  Reynold Bowen, MD  Cardiologist:   Mertie Moores, MD   No chief complaint on file.  1. Coronary artery disease-status post PCI( April , 1998) and CABG ( Nov. 1998) 2. Dyslipidemia 3. Diabetes mellitus 4. Left carotid bruit 5. Right hip arthritis     Cynthia [name] is a 86 y.o. y.o. female with a history of coronary artery disease. She status post PCI and then later had coronary artery bypass grafting. She also has a history of dyslipidemia, diabetes mellitus, and a left carotid bruit.  She has done well from a cardiac standpoint. She has not been exercising as much as she has in the past. She complains of generalized fatigue. She stays busy doing chores. 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  January 16, 2013:  Cynthia [name] is dong well. She is getting over several viral infections over the past few months. No CP.   Dec. 22,2014:  Cynthia [name] has had a rough summer. Also has had a cold. Her husband has been at [facility] since early Sept. No cardiac issues.   January 08, 2014:  Cynthia [name] is doing ok. She has lost lots of weight over the past year or so and now is gaining some weight back. Her has an abdominal bruit. Renal artery duplex study was normal.   She has started insulin therapy.   Dec. 1, 2015:  Cynthia [name] is an 86 yo who I follow for CAD, dyslipidema.  She has had some BP variablility.  No CP,  Has not been exercising. Has hip / back problems.  Her husband passed away this past 02/14/2023.    January 11, 2015:  Cynthia [name] is a 86 y.o. female who presents for  Follow up of her CAD and HTN. Having lots of pain this am - as a result , her BP is high  May need to have hip surgery    Dec. 2, 2016:  Doing well.  No cardiac issues. Had her R hip replacement.  [address] and broke her left arm while using her walker.   Bp and HR are well controlled.     January 09, 2016: Doing well.  No CP or dyspnea. Does not have the endurance that she used to have   Dec. 14, 2017  Having more DOE and chest pressure with exertion. Similar to prior to her CABG  Has not tried a SL NTG  Her symptoms started 3 weeks ago. Now she cannot do any exercise before she gets some chest tightness.   Jan. 25 2018: She presented to the office with angina in mid December. She was sent to the hospital and had a heart catheterization. She was found to have an ostial occlusion of the left circumflex artery. The LIMA to LAD was patent but the IMA was atretic.Cynthia [name] She has a proximal/mid LAD stent that is patent. There is a patent saphenous vein graft to the obtuse marginal artery and a patent saphenous vein graft to the second diagonal. The saphenous vein graft to the right coronary artery is occluded.  She had moderate left ventricle systolic function by cath  with an EF of 35-40%. She had inferior apical akinesis.  Echocardiogram revealed normal left ventricle systolic function. She has mild tricuspid regurgitation and mild mitral regurgitation.  She seems to be feeling better.    No further episodes of  CP or dyspnea.    Aug. 17, 2018:   . Has some rare episodes of chest tightness ( especially when carrying heavy objects )   October 22, 2017: He is seen back today for follow-up of her coronary artery disease, dyslipidemia, and peripheral vascular disease. She has noticed some chest pressure if she waks too fast  - especialy if she is carrying something .   Last cath was in Dec. 2017.   She thinks her symptoms have worsened since that time  Was on Valsartan - was recalled,    January 24, 2018:   Cynthia [name] is seen today for follow-up of her coronary artery disease, diabetes mellitus and hyperlipidemia. Is now on Telmasartan  Brought her BP log,  Most readings are good  No cp or dyspnea   Dec 15, 2019:  Cynthia [name] is  seen today for follow-up visit. I saw her last year as a  telemedicine visit. Brought her BP log from home .   Her BP looks better than the BP here - did not take her meds this am .  Has had both [place] covid vaccines. Has difficulty managing her diabetes   Past Medical History:  Diagnosis Date  . Anxiety   . Arthritis   . Carotid bruit    LEFT  . Coronary artery disease    STATUS POST PCI AND LATER CABG  . Diabetes mellitus   . Dyslipidemia   . Dysrhythmia    hx of palpitations and PVC's   . History of chicken pox   . History of echocardiogram    Echo August 31, 2022: mild LVH, EF 55-60, no RWMA, Gr 1 DD, MAC, mild MR, mild LAE, mild TR, PASP 31  . Hypertension   . Measles    hx of   . Mumps    hx of   . Myocardial infarction ([place])   . Palpitations   . PVC's (premature ventricular contractions)     Past Surgical History:  Procedure Laterality Date  . CARDIAC CATHETERIZATION  06/06/97   IT REVEALA MILD TO MODERATE LEFT VENTRICULAR SYSTOLIC DYSFUNCTION  WITH EF OF 40%. THERE IS PERSISTENT AKINESIS OF THE INFERIOR WALL. THERE IS MILD REGURGITATION PRESENT. THERE IS MILD HYPOKINESIS OF THE ANTERIOR  AND APEX WALL  . CARDIAC CATHETERIZATION N/A 07/27/2016   Procedure: Left Heart Cath and Cors/Grafts Angiography;  Surgeon: Nelva Bush, MD;  Location: [facility];  Service: Cardiovascular;  Laterality: N/A;  . CORONARY ARTERY BYPASS GRAFT    . TONSILLECTOMY    . TOTAL HIP ARTHROPLASTY Right 03/12/2015   Procedure: RIGHT TOTAL HIP ARTHROPLASTY ANTERIOR APPROACH;  Surgeon: Paralee Cancel, MD;  Location: [facility];  Service: Orthopedics;  Laterality: Right;     Current Outpatient Medications  Medication Sig Dispense Refill  . amLODipine (NORVASC) 2.5 MG tablet Take 1 tablet (2.5 mg total) by mouth daily. 30 tablet 11  . amoxicillin (AMOXIL) 500 MG capsule Take 2,000 mg by mouth See admin instructions. [facility] Dental procedures    . aspirin EC 81 MG tablet Take 81 mg by mouth daily.    Cynthia [name] atorvastatin (LIPITOR) 40 MG tablet TAKE 1 TABLET DAILY 90  tablet 2  . B-D INS SYR ULTRAFINE 1CC/31G 31G X 5/16" 1 ML MISC     . BD PEN NEEDLE NANO U/F 32G X 4 MM MISC Inject 1 each into the skin as directed. With insulin  6  . calcium-vitamin D (CALCIUM 500+D) 500-400 MG-UNIT per tablet Take 1 tablet by mouth 2 (  two) times daily.     . Coenzyme Q10 (COQ10) 200 MG CAPS Take 1 capsule by mouth every evening.     . ezetimibe (ZETIA) 10 MG tablet TAKE 1 TABLET DAILY 90 tablet 2  . insulin aspart (NOVOLOG FLEXPEN) 100 UNIT/ML FlexPen Inject 4 Units into the skin 3 (three) times daily with meals.    . insulin degludec (TRESIBA FLEXTOUCH) 100 UNIT/ML FlexTouch Pen Inject 11 Units into the skin daily.    . isosorbide mononitrate (IMDUR) 60 MG 24 hr tablet Take 1 tablet (60 mg total) by mouth daily. 90 tablet 2  . Multiple Vitamin (MULTIVITAMIN) tablet Take 1 tablet by mouth every morning.     . ONE TOUCH ULTRA TEST test strip 2 (two) times daily. test blood sugar  5  . ONETOUCH DELICA LANCETS 01B MISC CHECK BLOOD SUGARS 2 TIMES A DAY  5  . Polyethyl Glycol-Propyl Glycol (SYSTANE OP) Apply 1 drop to eye daily as needed (dry scratchy eyes).    . sitaGLIPtin-metformin (JANUMET) 50-1000 MG tablet Take 1 tablet by mouth 2 (two) times daily with a meal.    . telmisartan (MICARDIS) 20 MG tablet TAKE 1 TABLET BY MOUTH EVERY DAY 90 tablet 3  . vitamin C (ASCORBIC ACID) 500 MG tablet Take 500 mg by mouth every evening.     . nitroGLYCERIN (NITROSTAT) 0.4 MG SL tablet Place 1 tablet (0.4 mg total) under the tongue every 5 (five) minutes as needed for chest pain. 25 tablet 3   No current facility-administered medications for this visit.    Allergies:   Patient has no known allergies.    Social History:  The patient  reports that she has never smoked. She has never used smokeless tobacco. She reports that she does not drink alcohol or use drugs.   Family History:  The patient's family history includes Cancer in her father; Heart attack in her mother; Stroke in her  father.    ROS: Noted in current history, all other systems are negative.   Physical Exam: Blood pressure (!) 156/74, pulse 80, height _0  (1.575 m), weight 121 lb (54.9 kg), SpO2 99 %.  GEN:  Elderly female,  NAD  HEENT: Normal NECK: No JVD; No carotid bruits LYMPHATICS: No lymphadenopathy CARDIAC: RRR , no murmurs, rubs, gallops RESPIRATORY:  Clear to auscultation without rales, wheezing or rhonchi  ABDOMEN: Soft, non-tender, non-distended MUSCULOSKELETAL:  No edema; No deformity  SKIN: Warm and dry NEUROLOGIC:  Alert and oriented x 3   EKG:     Recent Labs: 11/07/2019: BUN 9; Creatinine, Ser 0.57; Potassium 4.6; Sodium 124    Lipid Panel    Component Value Date/Time   CHOL 90 (L) 07/04/2018 0917   TRIG 42 07/04/2018 0917   HDL 37 (L) 07/04/2018 0917   CHOLHDL 2.4 07/04/2018 0917   CHOLHDL 2.5 01/09/2016 0952   VLDL 8 01/09/2016 0952   LDLCALC 45 07/04/2018 0917      Wt Readings from Last 3 Encounters:  12/15/19 121 lb (54.9 kg)  10/17/19 121 lb (54.9 kg)  07/18/19 124 lb (56.2 kg)      Other studies Reviewed: Additional studies/ records that were reviewed today include: . Review of the above records demonstrates:    ASSESSMENT AND PLAN:  1. Coronary artery disease-status post PCI( April , 1998) and CABG ( Nov. 1998) -  Her last heart catheterization was in December, 2017 which revealed an atretic LIMA.  Her native LAD has mild nonobstructive disease and has  stents.  She has 35% stenosis in the saphenous vein graft to the second diagonal.  She remains very stable.  She is not having any episodes of angina.  2. Dyslipidemia -we will check labs today.   3.  Hypertension:    Blood pressure is typically well controlled.  Her blood pressure is a little higher today but her blood pressure log at home suggest that her blood pressures are all all very well controlled.  She has not taken her blood pressure medicine today.  4.  Left carotid bruit: .  The  following changes have been made:  no change  Labs/ tests ordered today include:   Orders Placed This Encounter  Procedures  . Lipid Profile  . Basic Metabolic Panel (BMET)  . Hepatic function panel   Disposition:    Mertie Moores, MD  12/15/2019 1:50 PM    [facility] [place], [place], [place]  [address] Phone: [phone]; Fax: [phone]

## 2019-12-15 NOTE — Patient Instructions (Signed)
Medication Instructions:  Your physician recommends that you continue on your current medications as directed. Please refer to the Current Medication list given to you today.  *If you need a refill on your cardiac medications before your next appointment, please call your pharmacy*   Lab Work: TODAY - cholesterol, liver panel, basic metabolic panel (kidney function/electrolytes)  If you have labs (blood work) drawn today and your tests are completely normal, you will receive your results only by: [name] MyChart Message (if you have MyChart) OR . A paper copy in the mail If you have any lab test that is abnormal or we need to change your treatment, we will call you to review the results.   Testing/Procedures: None Ordered   Follow-Up: At [facility], you and your health needs are our priority.  As part of our continuing mission to provide you with exceptional heart care, we have created designated Provider Care Teams.  These Care Teams include your primary Cardiologist (physician) and Advanced Practice Providers (APPs -  Physician Assistants and Nurse Practitioners) who all work together to provide you with the care you need, when you need it.     Your next appointment:   1 year(s)  The format for your next appointment:   In Person  Provider:   You may see Mertie Moores, MD or one of the following Advanced Practice Providers on your designated Care Team:    Richardson Dopp, PA-C  [place], [place]

## 2019-12-26 ENCOUNTER — Other Ambulatory Visit: Payer: Self-pay | Admitting: Cardiovascular Disease

## 2019-12-26 DIAGNOSIS — E1140 Type 2 diabetes mellitus with diabetic neuropathy, unspecified: Secondary | ICD-10-CM | POA: Diagnosis not present

## 2019-12-26 DIAGNOSIS — M810 Age-related osteoporosis without current pathological fracture: Secondary | ICD-10-CM | POA: Diagnosis not present

## 2019-12-26 DIAGNOSIS — E042 Nontoxic multinodular goiter: Secondary | ICD-10-CM | POA: Diagnosis not present

## 2019-12-26 NOTE — Telephone Encounter (Signed)
Outpatient Medication Detail   Disp Refills Start End   isosorbide mononitrate (IMDUR) 60 MG 24 hr tablet 90 tablet 2 07/18/2019    Sig - Route: Take 1 tablet (60 mg total) by mouth daily. - Oral   Sent to pharmacy as: isosorbide mononitrate (IMDUR) 60 MG 24 hr tablet   E-Prescribing Status: Receipt confirmed by pharmacy (07/18/2019 11:24 AM EST)   Pharmacy  [id number] - [place], [place] - [place] [place]

## 2020-01-02 DIAGNOSIS — M810 Age-related osteoporosis without current pathological fracture: Secondary | ICD-10-CM | POA: Diagnosis not present

## 2020-01-02 DIAGNOSIS — R269 Unspecified abnormalities of gait and mobility: Secondary | ICD-10-CM | POA: Diagnosis not present

## 2020-01-02 DIAGNOSIS — E7849 Other hyperlipidemia: Secondary | ICD-10-CM | POA: Diagnosis not present

## 2020-01-02 DIAGNOSIS — I5189 Other ill-defined heart diseases: Secondary | ICD-10-CM | POA: Diagnosis not present

## 2020-01-02 DIAGNOSIS — I25810 Atherosclerosis of coronary artery bypass graft(s) without angina pectoris: Secondary | ICD-10-CM | POA: Diagnosis not present

## 2020-01-02 DIAGNOSIS — Z1389 Encounter for screening for other disorder: Secondary | ICD-10-CM | POA: Diagnosis not present

## 2020-01-02 DIAGNOSIS — D696 Thrombocytopenia, unspecified: Secondary | ICD-10-CM | POA: Diagnosis not present

## 2020-01-02 DIAGNOSIS — R82998 Other abnormal findings in urine: Secondary | ICD-10-CM | POA: Diagnosis not present

## 2020-01-02 DIAGNOSIS — Z794 Long term (current) use of insulin: Secondary | ICD-10-CM | POA: Diagnosis not present

## 2020-01-02 DIAGNOSIS — E1142 Type 2 diabetes mellitus with diabetic polyneuropathy: Secondary | ICD-10-CM | POA: Diagnosis not present

## 2020-01-02 DIAGNOSIS — Z0000 Encounter for general adult medical examination without abnormal findings: Secondary | ICD-10-CM | POA: Diagnosis not present

## 2020-01-02 DIAGNOSIS — R7401 Elevation of levels of liver transaminase levels: Secondary | ICD-10-CM | POA: Diagnosis not present

## 2020-01-02 DIAGNOSIS — E871 Hypo-osmolality and hyponatremia: Secondary | ICD-10-CM | POA: Diagnosis not present

## 2020-04-27 ENCOUNTER — Other Ambulatory Visit: Payer: Self-pay | Admitting: Physician Assistant

## 2020-05-14 DIAGNOSIS — I10 Essential (primary) hypertension: Secondary | ICD-10-CM | POA: Diagnosis not present

## 2020-05-14 DIAGNOSIS — E7849 Other hyperlipidemia: Secondary | ICD-10-CM | POA: Diagnosis not present

## 2020-05-14 DIAGNOSIS — Z794 Long term (current) use of insulin: Secondary | ICD-10-CM | POA: Diagnosis not present

## 2020-05-14 DIAGNOSIS — E1140 Type 2 diabetes mellitus with diabetic neuropathy, unspecified: Secondary | ICD-10-CM | POA: Diagnosis not present

## 2020-05-14 DIAGNOSIS — Z23 Encounter for immunization: Secondary | ICD-10-CM | POA: Diagnosis not present

## 2020-07-01 DIAGNOSIS — M810 Age-related osteoporosis without current pathological fracture: Secondary | ICD-10-CM | POA: Diagnosis not present

## 2020-07-01 DIAGNOSIS — I6523 Occlusion and stenosis of bilateral carotid arteries: Secondary | ICD-10-CM | POA: Diagnosis not present

## 2020-07-01 DIAGNOSIS — I2510 Atherosclerotic heart disease of native coronary artery without angina pectoris: Secondary | ICD-10-CM | POA: Diagnosis not present

## 2020-07-01 DIAGNOSIS — D72819 Decreased white blood cell count, unspecified: Secondary | ICD-10-CM | POA: Diagnosis not present

## 2020-07-01 DIAGNOSIS — R269 Unspecified abnormalities of gait and mobility: Secondary | ICD-10-CM | POA: Diagnosis not present

## 2020-07-01 DIAGNOSIS — D696 Thrombocytopenia, unspecified: Secondary | ICD-10-CM | POA: Diagnosis not present

## 2020-07-01 DIAGNOSIS — E785 Hyperlipidemia, unspecified: Secondary | ICD-10-CM | POA: Diagnosis not present

## 2020-07-01 DIAGNOSIS — I10 Essential (primary) hypertension: Secondary | ICD-10-CM | POA: Diagnosis not present

## 2020-07-01 DIAGNOSIS — E042 Nontoxic multinodular goiter: Secondary | ICD-10-CM | POA: Diagnosis not present

## 2020-07-01 DIAGNOSIS — E1140 Type 2 diabetes mellitus with diabetic neuropathy, unspecified: Secondary | ICD-10-CM | POA: Diagnosis not present

## 2020-07-01 DIAGNOSIS — E871 Hypo-osmolality and hyponatremia: Secondary | ICD-10-CM | POA: Diagnosis not present

## 2020-07-01 DIAGNOSIS — D500 Iron deficiency anemia secondary to blood loss (chronic): Secondary | ICD-10-CM | POA: Diagnosis not present

## 2020-07-03 DIAGNOSIS — Z1231 Encounter for screening mammogram for malignant neoplasm of breast: Secondary | ICD-10-CM | POA: Diagnosis not present

## 2020-07-15 ENCOUNTER — Other Ambulatory Visit: Payer: Self-pay | Admitting: Cardiovascular Disease

## 2020-07-15 DIAGNOSIS — E785 Hyperlipidemia, unspecified: Secondary | ICD-10-CM

## 2020-07-30 DIAGNOSIS — E119 Type 2 diabetes mellitus without complications: Secondary | ICD-10-CM | POA: Diagnosis not present

## 2020-07-30 DIAGNOSIS — Z961 Presence of intraocular lens: Secondary | ICD-10-CM | POA: Diagnosis not present

## 2020-07-30 DIAGNOSIS — H52203 Unspecified astigmatism, bilateral: Secondary | ICD-10-CM | POA: Diagnosis not present

## 2020-08-05 ENCOUNTER — Other Ambulatory Visit: Payer: Self-pay | Admitting: Cardiovascular Disease

## 2020-08-21 DIAGNOSIS — I10 Essential (primary) hypertension: Secondary | ICD-10-CM | POA: Diagnosis not present

## 2020-08-21 DIAGNOSIS — I2510 Atherosclerotic heart disease of native coronary artery without angina pectoris: Secondary | ICD-10-CM | POA: Diagnosis not present

## 2020-08-21 DIAGNOSIS — E1140 Type 2 diabetes mellitus with diabetic neuropathy, unspecified: Secondary | ICD-10-CM | POA: Diagnosis not present

## 2020-08-21 DIAGNOSIS — Z794 Long term (current) use of insulin: Secondary | ICD-10-CM | POA: Diagnosis not present

## 2020-10-17 DIAGNOSIS — L821 Other seborrheic keratosis: Secondary | ICD-10-CM | POA: Diagnosis not present

## 2020-10-17 DIAGNOSIS — L820 Inflamed seborrheic keratosis: Secondary | ICD-10-CM | POA: Diagnosis not present

## 2020-10-17 DIAGNOSIS — D2262 Melanocytic nevi of left upper limb, including shoulder: Secondary | ICD-10-CM | POA: Diagnosis not present

## 2020-10-17 DIAGNOSIS — D2272 Melanocytic nevi of left lower limb, including hip: Secondary | ICD-10-CM | POA: Diagnosis not present

## 2020-10-17 DIAGNOSIS — D225 Melanocytic nevi of trunk: Secondary | ICD-10-CM | POA: Diagnosis not present

## 2020-10-17 DIAGNOSIS — D2271 Melanocytic nevi of right lower limb, including hip: Secondary | ICD-10-CM | POA: Diagnosis not present

## 2020-10-17 DIAGNOSIS — D2261 Melanocytic nevi of right upper limb, including shoulder: Secondary | ICD-10-CM | POA: Diagnosis not present

## 2020-10-17 DIAGNOSIS — D485 Neoplasm of uncertain behavior of skin: Secondary | ICD-10-CM | POA: Diagnosis not present

## 2020-11-02 ENCOUNTER — Other Ambulatory Visit: Payer: Self-pay | Admitting: Cardiovascular Disease

## 2020-11-11 DIAGNOSIS — Z794 Long term (current) use of insulin: Secondary | ICD-10-CM | POA: Diagnosis not present

## 2020-11-11 DIAGNOSIS — E042 Nontoxic multinodular goiter: Secondary | ICD-10-CM | POA: Diagnosis not present

## 2020-11-11 DIAGNOSIS — I2510 Atherosclerotic heart disease of native coronary artery without angina pectoris: Secondary | ICD-10-CM | POA: Diagnosis not present

## 2020-11-11 DIAGNOSIS — E559 Vitamin D deficiency, unspecified: Secondary | ICD-10-CM | POA: Diagnosis not present

## 2020-11-11 DIAGNOSIS — I6523 Occlusion and stenosis of bilateral carotid arteries: Secondary | ICD-10-CM | POA: Diagnosis not present

## 2020-11-11 DIAGNOSIS — E1140 Type 2 diabetes mellitus with diabetic neuropathy, unspecified: Secondary | ICD-10-CM | POA: Diagnosis not present

## 2020-11-11 DIAGNOSIS — I10 Essential (primary) hypertension: Secondary | ICD-10-CM | POA: Diagnosis not present

## 2020-11-11 DIAGNOSIS — D72819 Decreased white blood cell count, unspecified: Secondary | ICD-10-CM | POA: Diagnosis not present

## 2020-11-11 DIAGNOSIS — M810 Age-related osteoporosis without current pathological fracture: Secondary | ICD-10-CM | POA: Diagnosis not present

## 2020-11-11 DIAGNOSIS — D696 Thrombocytopenia, unspecified: Secondary | ICD-10-CM | POA: Diagnosis not present

## 2020-11-11 DIAGNOSIS — E871 Hypo-osmolality and hyponatremia: Secondary | ICD-10-CM | POA: Diagnosis not present

## 2020-11-15 ENCOUNTER — Other Ambulatory Visit: Payer: Self-pay | Admitting: Cardiovascular Disease

## 2020-12-11 ENCOUNTER — Ambulatory Visit: Payer: Medicare Other | Admitting: Cardiovascular Disease

## 2020-12-24 DIAGNOSIS — I2510 Atherosclerotic heart disease of native coronary artery without angina pectoris: Secondary | ICD-10-CM | POA: Diagnosis not present

## 2020-12-24 DIAGNOSIS — I10 Essential (primary) hypertension: Secondary | ICD-10-CM | POA: Diagnosis not present

## 2020-12-24 DIAGNOSIS — Z794 Long term (current) use of insulin: Secondary | ICD-10-CM | POA: Diagnosis not present

## 2020-12-24 DIAGNOSIS — E1140 Type 2 diabetes mellitus with diabetic neuropathy, unspecified: Secondary | ICD-10-CM | POA: Diagnosis not present

## 2021-01-04 ENCOUNTER — Other Ambulatory Visit: Payer: Self-pay | Admitting: Cardiovascular Disease

## 2021-01-04 DIAGNOSIS — E785 Hyperlipidemia, unspecified: Secondary | ICD-10-CM

## 2021-02-19 ENCOUNTER — Encounter: Payer: Self-pay | Admitting: Cardiovascular Disease

## 2021-02-19 NOTE — Progress Notes (Signed)
Cardiology Office Note   Date:  02/20/2021   ID:  Cynthia [name], DOB [birth year], MRN [id number]  PCP:  Reynold Bowen, MD  Cardiologist:   Mertie Moores, MD   Chief Complaint  Patient presents with   Coronary Artery Disease   1. Coronary artery disease-status post PCI( April , 1998)  and CABG ( Nov. 1998) 2. Dyslipidemia 3. Diabetes mellitus 4. Left carotid bruit 5. Right hip arthritis     Cynthia [name] is a 87 y.o.  y.o. female with a history of coronary artery disease. She status post PCI and then later had coronary artery bypass grafting. She also has a history of dyslipidemia, diabetes mellitus, and a left carotid bruit.  She has done well from a cardiac standpoint.  She has not been exercising as much as she has in the past.  She complains of generalized fatigue.  She stays busy doing chores.  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  January 16, 2013:  Cynthia [name] is dong well.  She is getting over several viral infections over the past few months. No CP.    Dec. 22,2014:  Cynthia [name] has had a rough summer.  Also has had a cold.  Her husband has been at [facility] since early Sept.   No cardiac issues.   January 08, 2014:  Cynthia [name] is doing ok.  She has lost lots of weight over the past year or so and now is gaining some weight back.    Her has an abdominal bruit.  Renal artery duplex study was normal.   She has started insulin therapy.    Dec. 1, 2015:  Cynthia [name] is an 87 yo who I follow for CAD, dyslipidema.   She has had some BP variablility.   No CP,   Has not been exercising.  Has hip / back problems.   Her husband passed away this past Jan 20, 2023.    January 11, 2015:  Cynthia [name] is a 87 y.o. female who presents for  Follow up of her CAD and HTN. Having lots of pain this am - as a result , her BP is high  May need to have hip surgery    Dec. 2, 2016:  Doing well.  No cardiac issues. Had her R hip replacement.  [address] and broke her left arm while using her  walker.   Bp and HR are well controlled.    January 09, 2016: Doing well.  No CP or dyspnea. Does not have the endurance that she used to have   Dec. 14, 2017  Having more DOE and chest pressure with exertion. Similar to prior to her CABG  Has not tried a SL NTG  Her symptoms started 3 weeks ago. Now she cannot do any exercise before she gets some chest tightness.   Jan. 25 2018: She presented to the office with angina in mid December. She was sent to the hospital and had a heart catheterization. She was found to have an ostial occlusion of the left circumflex artery. The LIMA to LAD was patent but the IMA was atretic.Cynthia [name] She has a proximal/mid LAD stent that is patent. There is a patent saphenous vein graft to the obtuse marginal artery and a patent saphenous vein graft to the second diagonal. The saphenous vein graft to the right coronary artery is occluded.  She had moderate left ventricle systolic function by cath  with an EF of 35-40%. She had inferior apical akinesis.  Echocardiogram revealed normal left ventricle systolic function. She has mild tricuspid regurgitation and mild mitral regurgitation.  She seems to be feeling better.    No further episodes of CP or dyspnea.    Aug. 17, 2018:   . Has some rare episodes of chest tightness ( especially when carrying heavy objects )   October 22, 2017: He is seen back today for follow-up of her coronary artery disease, dyslipidemia, and peripheral vascular disease. She has noticed some chest pressure if she waks too fast  - especialy if she is carrying something .   Last cath was in Dec. 2017.   She thinks her symptoms have worsened since that time  Was on Valsartan - was recalled,    January 24, 2018:   Cynthia [name] is seen today for follow-up of her coronary artery disease, diabetes mellitus and hyperlipidemia. Is now on Telmasartan  Brought her BP log,  Most readings are good  No cp or dyspnea   Dec 15, 2019:  [name] is  seen today for  follow-up visit. I saw her last year as a telemedicine visit. Brought her BP log from home .   Her BP looks better than the BP here - did not take her meds this am .  Has had both [place] covid vaccines. Has difficulty managing her diabetes   February 20, 2021: Cynthia [name] is seen today for follow up visit  Hx of HTN, CAD No CP , Stays active ,  still eats some salt  DM is well controlled    Past Medical History:  Diagnosis Date   Anxiety    Arthritis    Carotid bruit    LEFT   Coronary artery disease    STATUS POST PCI AND LATER CABG   Diabetes mellitus    Dyslipidemia    Dysrhythmia    hx of palpitations and PVC's    History of chicken pox    History of echocardiogram    Echo September 16, 2022: mild LVH, EF 55-60, no RWMA, Gr 1 DD, MAC, mild MR, mild LAE, mild TR, PASP 31   Hypertension    Measles    hx of    Mumps    hx of    Myocardial infarction ([place])    Palpitations    PVC's (premature ventricular contractions)     Past Surgical History:  Procedure Laterality Date   CARDIAC CATHETERIZATION  06/06/97   IT REVEALA MILD TO MODERATE LEFT VENTRICULAR SYSTOLIC DYSFUNCTION  WITH EF OF 40%. THERE IS PERSISTENT AKINESIS OF THE INFERIOR WALL. THERE IS MILD REGURGITATION PRESENT. THERE IS MILD HYPOKINESIS OF THE ANTERIOR  AND APEX WALL   CARDIAC CATHETERIZATION N/A 07/27/2016   Procedure: Left Heart Cath and Cors/Grafts Angiography;  Surgeon: Nelva Bush, MD;  Location: [facility];  Service: Cardiovascular;  Laterality: N/A;   CORONARY ARTERY BYPASS GRAFT     TONSILLECTOMY     TOTAL HIP ARTHROPLASTY Right 03/12/2015   Procedure: RIGHT TOTAL HIP ARTHROPLASTY ANTERIOR APPROACH;  Surgeon: Paralee Cancel, MD;  Location: [facility];  Service: Orthopedics;  Laterality: Right;     Current Outpatient Medications  Medication Sig Dispense Refill   amLODipine (NORVASC) 2.5 MG tablet TAKE 1 TABLET BY MOUTH EVERY DAY 90 tablet 3   amoxicillin (AMOXIL) 500 MG capsule Take 2,000 mg by mouth See admin  instructions. Dor Dental procedures     aspirin EC 81 MG tablet Take 81 mg by mouth daily.     atorvastatin (LIPITOR) 40 MG tablet  Take 1 tablet (40 mg total) by mouth daily. 90 tablet 0   B-D INS SYR ULTRAFINE 1CC/31G 31G X 5/16" 1 ML MISC      BD PEN NEEDLE NANO U/F 32G X 4 MM MISC Inject 1 each into the skin as directed. With insulin  6   calcium-vitamin D (OSCAL-500) 500-400 MG-UNIT tablet Take 1 tablet by mouth 2 (two) times daily.      Coenzyme Q10 (COQ10) 200 MG CAPS Take 1 capsule by mouth every evening.      ezetimibe (ZETIA) 10 MG tablet TAKE 1 TABLET DAILY 90 tablet 0   insulin aspart (NOVOLOG FLEXPEN) 100 UNIT/ML FlexPen Inject 6 Units into the skin 3 (three) times daily with meals.     insulin degludec (TRESIBA FLEXTOUCH) 100 UNIT/ML FlexTouch Pen Inject 11 Units into the skin daily.     isosorbide mononitrate (IMDUR) 60 MG 24 hr tablet TAKE 1 TABLET BY MOUTH EVERY DAY 90 tablet 2   Multiple Vitamin (MULTIVITAMIN) tablet Take 1 tablet by mouth every morning.      ONE TOUCH ULTRA TEST test strip 2 (two) times daily. test blood sugar  5   ONETOUCH DELICA LANCETS 02R MISC CHECK BLOOD SUGARS 2 TIMES A DAY  5   Polyethyl Glycol-Propyl Glycol (SYSTANE OP) Apply 1 drop to eye daily as needed (dry scratchy eyes).     sitaGLIPtin-metformin (JANUMET) 50-1000 MG tablet Take 1 tablet by mouth 2 (two) times daily with a meal.     telmisartan (MICARDIS) 40 MG tablet Take 1 tablet (40 mg total) by mouth daily. 90 tablet 3   vitamin C (ASCORBIC ACID) 500 MG tablet Take 500 mg by mouth every evening.      nitroGLYCERIN (NITROSTAT) 0.4 MG SL tablet Place 1 tablet (0.4 mg total) under the tongue every 5 (five) minutes as needed for chest pain. 25 tablet 3   No current facility-administered medications for this visit.    Allergies:   Patient has no known allergies.    Social History:  The patient  reports that she has never smoked. She has never used smokeless tobacco. She reports that she does  not drink alcohol and does not use drugs.   Family History:  The patient's family history includes Cancer in her father; Heart attack in her mother; Stroke in her father.    ROS: Noted in current history, all other systems are negative.  Physical Exam: Blood pressure (!) 146/66, pulse 69, height _0  (1.549 m), weight 123 lb (55.8 kg), SpO2 97 %.  GEN:  Well nourished, well developed in no acute distress HEENT: Normal NECK: No JVD; No carotid bruits LYMPHATICS: No lymphadenopathy CARDIAC: RRR , no murmurs, rubs, gallops RESPIRATORY:  Clear to auscultation without rales, wheezing or rhonchi  ABDOMEN: Soft, non-tender, non-distended MUSCULOSKELETAL:  No edema; No deformity  SKIN: Warm and dry NEUROLOGIC:  Alert and oriented x 3    EKG:    February 20, 2021:   NSR at 88.  LBBB .   Recent Labs: No results found for requested labs within last 8760 hours.    Lipid Panel    Component Value Date/Time   CHOL 100 12/15/2019 1051   TRIG 44 12/15/2019 1051   HDL 45 12/15/2019 1051   CHOLHDL 2.2 12/15/2019 1051   CHOLHDL 2.5 01/09/2016 0952   VLDL 8 01/09/2016 0952   LDLCALC 44 12/15/2019 1051      Wt Readings from Last 3 Encounters:  02/20/21 123 lb (55.8 kg)  12/15/19 121 lb (54.9 kg)  10/17/19 121 lb (54.9 kg)      Other studies Reviewed: Additional studies/ records that were reviewed today include: . Review of the above records demonstrates:    ASSESSMENT AND PLAN:  1. Coronary artery disease-status post PCI( April , 1998)  and CABG ( Nov. 1998) -  Her last heart catheterization was in December, 2017 which revealed an atretic LIMA.  Her native LAD has mild nonobstructive disease and has stents.  She has 35% stenosis in the saphenous vein graft to the second diagonal.  She denies any angian   2. Dyslipidemia -  lipids have been well controlled.   Check lipids, ALT, BMP in 3 weeks.     3.  Hypertension:    increase micardis to 40 mg a day . BMP in 3 weeks    4.   Left carotid bruit: .- stable   5.  LBBB :    repeat echo in 3 weeks.   The following changes have been made:  no change  Labs/ tests ordered today include:   Orders Placed This Encounter  Procedures   Basic Metabolic Panel (BMET)   ALT   Lipid Profile   EKG 12-Lead   ECHOCARDIOGRAM COMPLETE    Disposition:    Mertie Moores, MD  02/20/2021 9:04 PM    [facility] [place], [place], [place]  [address] Phone: [phone]; Fax: [phone]

## 2021-02-20 ENCOUNTER — Ambulatory Visit (INDEPENDENT_AMBULATORY_CARE_PROVIDER_SITE_OTHER): Payer: Medicare Other | Admitting: Cardiovascular Disease

## 2021-02-20 ENCOUNTER — Other Ambulatory Visit: Payer: Self-pay

## 2021-02-20 ENCOUNTER — Encounter: Payer: Self-pay | Admitting: Cardiovascular Disease

## 2021-02-20 VITALS — BP 146/66 | HR 69 | Ht 61.0 in | Wt 123.0 lb

## 2021-02-20 DIAGNOSIS — I2510 Atherosclerotic heart disease of native coronary artery without angina pectoris: Secondary | ICD-10-CM

## 2021-02-20 DIAGNOSIS — I10 Essential (primary) hypertension: Secondary | ICD-10-CM | POA: Diagnosis not present

## 2021-02-20 DIAGNOSIS — I447 Left bundle-branch block, unspecified: Secondary | ICD-10-CM

## 2021-02-20 DIAGNOSIS — E782 Mixed hyperlipidemia: Secondary | ICD-10-CM

## 2021-02-20 MED ORDER — TELMISARTAN 40 MG PO TABS: 40.0000 mg | ORAL_TABLET | Freq: Every day | ORAL | 3 refills | Status: DC

## 2021-02-20 NOTE — Patient Instructions (Signed)
Medication Instructions:  Your physician has recommended you make the following change in your medication:  INCREASE Micardis (Telmisartan) to 40 mg daily  *If you need a refill on your cardiac medications before your next appointment, please call your pharmacy*   Lab Work: Your physician recommends that you return for lab work in: 3-4 weeks - You may schedule same day as Echo You will need to FAST for this appointment - nothing to eat or drink after midnight the night before except water.  If you have labs (blood work) drawn today and your tests are completely normal, you will receive your results only by: [place] (if you have MyChart) OR A paper copy in the mail If you have any lab test that is abnormal or we need to change your treatment, we will call you to review the results.   Testing/Procedures: Your physician has requested that you have an echocardiogram. Echocardiography is a painless test that uses sound waves to create images of your heart. It provides your doctor with information about the size and shape of your heart and how well your heart's chambers and valves are working. This procedure takes approximately one hour. There are no restrictions for this procedure.    Follow-Up: At [facility], you and your health needs are our priority.  As part of our continuing mission to provide you with exceptional heart care, we have created designated Provider Care Teams.  These Care Teams include your primary Cardiologist (physician) and Advanced Practice Providers (APPs -  Physician Assistants and Nurse Practitioners) who all work together to provide you with the care you need, when you need it.    Your next appointment:   1 year(s)  The format for your next appointment:   In Person  Provider:   You may see Mertie Moores, MD or one of the following Advanced Practice Providers on your designated Care Team:   Richardson Dopp, PA-C [place], [place]

## 2021-02-21 ENCOUNTER — Ambulatory Visit: Payer: Medicare Other | Admitting: Cardiovascular Disease

## 2021-02-25 ENCOUNTER — Other Ambulatory Visit: Payer: Self-pay | Admitting: Cardiovascular Disease

## 2021-03-17 ENCOUNTER — Other Ambulatory Visit: Payer: Self-pay

## 2021-03-17 ENCOUNTER — Other Ambulatory Visit: Payer: Medicare Other | Admitting: *Deleted

## 2021-03-17 ENCOUNTER — Ambulatory Visit (HOSPITAL_COMMUNITY): Payer: Medicare Other | Attending: Cardiology

## 2021-03-17 DIAGNOSIS — I2510 Atherosclerotic heart disease of native coronary artery without angina pectoris: Secondary | ICD-10-CM | POA: Diagnosis not present

## 2021-03-17 DIAGNOSIS — E782 Mixed hyperlipidemia: Secondary | ICD-10-CM | POA: Diagnosis not present

## 2021-03-17 DIAGNOSIS — I447 Left bundle-branch block, unspecified: Secondary | ICD-10-CM

## 2021-03-17 DIAGNOSIS — I10 Essential (primary) hypertension: Secondary | ICD-10-CM

## 2021-03-17 LAB — BASIC METABOLIC PANEL
BUN/Creatinine Ratio: 9 — ABNORMAL LOW (ref 12–28)
BUN: 5 mg/dL — ABNORMAL LOW (ref 8–27)
CO2: 23 mmol/L (ref 20–29)
Calcium: 9.8 mg/dL (ref 8.7–10.3)
Chloride: 98 mmol/L (ref 96–106)
Creatinine, Ser: 0.56 mg/dL — ABNORMAL LOW (ref 0.57–1.00)
Glucose: 97 mg/dL (ref 65–99)
Potassium: 4.3 mmol/L (ref 3.5–5.2)
Sodium: 135 mmol/L (ref 134–144)
eGFR: 88 mL/min/{1.73_m2} (ref >59)

## 2021-03-17 LAB — LIPID PANEL
Chol/HDL Ratio: 2.1 ratio (ref 0.0–4.4)
Cholesterol, Total: 103 mg/dL (ref 100–199)
HDL: 49 mg/dL (ref >39)
LDL Chol Calc (NIH): 42 mg/dL (ref 0–99)
Triglycerides: 50 mg/dL (ref 0–149)
VLDL Cholesterol Cal: 12 mg/dL (ref 5–40)

## 2021-03-17 LAB — ECHOCARDIOGRAM COMPLETE
Area-P 1/2: 2.84 cm2
Calc EF: 37.8 %
MV M vel: 5.41 m/s
MV Peak grad: 117.1 mmHg
S' Lateral: 3.6 cm
Single Plane A2C EF: 35 %
Single Plane A4C EF: 41.8 %

## 2021-03-17 LAB — ALT: ALT: 19 IU/L (ref 0–32)

## 2021-03-18 ENCOUNTER — Telehealth: Payer: Self-pay

## 2021-03-18 MED ORDER — CARVEDILOL 3.125 MG PO TABS: 3.1250 mg | ORAL_TABLET | Freq: Two times a day (BID) | ORAL | 3 refills | Status: DC

## 2021-03-18 NOTE — Telephone Encounter (Signed)
-----   Message from Thayer Headings, MD sent at 03/18/2021  3:35 PM EDT ----- EF is 30-35% DC amlodipine Start coreg 3.125 BID Office visit with me in 4-6 weeks

## 2021-03-18 NOTE — Telephone Encounter (Signed)
Patient aware of results. Will send in Coreg 3.125 mg BID and discontinued amlodipine. Patient will see Dr. Acie Fredrickson next month for office visit.

## 2021-04-01 DIAGNOSIS — Z794 Long term (current) use of insulin: Secondary | ICD-10-CM | POA: Diagnosis not present

## 2021-04-01 DIAGNOSIS — E1140 Type 2 diabetes mellitus with diabetic neuropathy, unspecified: Secondary | ICD-10-CM | POA: Diagnosis not present

## 2021-04-01 DIAGNOSIS — I10 Essential (primary) hypertension: Secondary | ICD-10-CM | POA: Diagnosis not present

## 2021-04-01 DIAGNOSIS — I2510 Atherosclerotic heart disease of native coronary artery without angina pectoris: Secondary | ICD-10-CM | POA: Diagnosis not present

## 2021-04-11 ENCOUNTER — Other Ambulatory Visit: Payer: Self-pay | Admitting: Cardiovascular Disease

## 2021-04-11 DIAGNOSIS — E785 Hyperlipidemia, unspecified: Secondary | ICD-10-CM

## 2021-04-14 ENCOUNTER — Encounter: Payer: Self-pay | Admitting: Cardiovascular Disease

## 2021-04-14 NOTE — Progress Notes (Signed)
Cardiology Office Note   Date:  04/16/2021   ID:  Cynthia [name], Cynthia [name] Jan 05, 1932, MRN [id number]  PCP:  Reynold Bowen, MD  Cardiologist:   Mertie Moores, MD   Chief Complaint  Patient presents with   Coronary Artery Disease   1. Coronary artery disease-status post PCI( April , 1998)  and CABG ( Nov. 1998) 2. Dyslipidemia 3. Diabetes mellitus 4. Left carotid bruit 5. Right hip arthritis     Cynthia [name] is a 87 y.o.  y.o. female with a history of coronary artery disease. She status post PCI and then later had coronary artery bypass grafting. She also has a history of dyslipidemia, diabetes mellitus, and a left carotid bruit.  She has done well from a cardiac standpoint.  She has not been exercising as much as she has in the past.  She complains of generalized fatigue.  She stays busy doing chores.  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  January 16, 2013:  Cynthia [name] is dong well.  She is getting over several viral infections over the past few months. No CP.    Dec. 22,2014:  Cynthia [name] has had a rough summer.  Also has had a cold.  Her husband has been at [facility] since early Sept.   No cardiac issues.   January 08, 2014:  Cynthia [name] is doing ok.  She has lost lots of weight over the past year or so and now is gaining some weight back.    Her has an abdominal bruit.  Renal artery duplex study was normal.   She has started insulin therapy.    Dec. 1, 2015:  Cynthia [name] is an 87 yo who I follow for CAD, dyslipidema.   She has had some BP variablility.   No CP,   Has not been exercising.  Has hip / back problems.   Her husband passed away this past 01/24/2023.    January 11, 2015:  Cynthia [name] is a 87 y.o. female who presents for  Follow up of her CAD and HTN. Having lots of pain this am - as a result , her BP is high  May need to have hip surgery    Dec. 2, 2016:  Doing well.  No cardiac issues. Had her R hip replacement.  [place] and broke her left arm while using her  walker.   Bp and HR are well controlled.    January 09, 2016: Doing well.  No CP or dyspnea. Does not have the endurance that she used to have   Dec. 14, 2017  Having more DOE and chest pressure with exertion. Similar to prior to her CABG  Has not tried a SL NTG  Her symptoms started 3 weeks ago. Now she cannot do any exercise before she gets some chest tightness.   Jan. 25 2018: She presented to the office with angina in mid December. She was sent to the hospital and had a heart catheterization. She was found to have an ostial occlusion of the left circumflex artery. The LIMA to LAD was patent but the IMA was atretic.Cynthia [name] She has a proximal/mid LAD stent that is patent. There is a patent saphenous vein graft to the obtuse marginal artery and a patent saphenous vein graft to the second diagonal. The saphenous vein graft to the right coronary artery is occluded.  She had moderate left ventricle systolic function by cath  with an EF of 35-40%. She had inferior apical akinesis.  Echocardiogram revealed normal left ventricle systolic function. She has mild tricuspid regurgitation and mild mitral regurgitation.  She seems to be feeling better.    No further episodes of CP or dyspnea.    Aug. 17, 2018:   . Has some rare episodes of chest tightness ( especially when carrying heavy objects )   October 22, 2017: He is seen back today for follow-up of her coronary artery disease, dyslipidemia, and peripheral vascular disease. She has noticed some chest pressure if she waks too fast  - especialy if she is carrying something .   Last cath was in Dec. 2017.   She thinks her symptoms have worsened since that time  Was on Valsartan - was recalled,    January 24, 2018:   Cynthia [name] is seen today for follow-up of her coronary artery disease, diabetes mellitus and hyperlipidemia. Is now on Telmasartan  Brought her BP log,  Most readings are good  No cp or dyspnea   Dec 15, 2019:  Shonette is  seen today for  follow-up visit. I saw her last year as a telemedicine visit. Brought her BP log from home .   Her BP looks better than the BP here - did not take her meds this am .  Has had both [place] covid vaccines. Has difficulty managing her diabetes   February 20, 2021: Cynthia [name] is seen today for follow up visit  Hx of HTN, CAD No CP , Stays active ,  still eats some salt  DM is well controlled    Sept. 7, 2022: Cynthia [name] is seen today for follow up visit  Hx of HTN, CAD, LBBB  She was seen in July with worsening dyspnea  A recent echo showed EF 30-35%. Amlodipine was stopped Coreg 3.125 BID was started , also on Micardis - consider Entresto    consider Farxega or Jardiance     Past Medical History:  Diagnosis Date   Anxiety    Arthritis    Carotid bruit    LEFT   Coronary artery disease    STATUS POST PCI AND LATER CABG   Diabetes mellitus    Dyslipidemia    Dysrhythmia    hx of palpitations and PVC's    History of chicken pox    History of echocardiogram    Echo 09/09/2022: mild LVH, EF 55-60, no RWMA, Gr 1 DD, MAC, mild MR, mild LAE, mild TR, PASP 31   Hypertension    Measles    hx of    Mumps    hx of    Myocardial infarction ([place])    Palpitations    PVC's (premature ventricular contractions)     Past Surgical History:  Procedure Laterality Date   CARDIAC CATHETERIZATION  06/06/97   IT REVEALA MILD TO MODERATE LEFT VENTRICULAR SYSTOLIC DYSFUNCTION  WITH EF OF 40%. THERE IS PERSISTENT AKINESIS OF THE INFERIOR WALL. THERE IS MILD REGURGITATION PRESENT. THERE IS MILD HYPOKINESIS OF THE ANTERIOR  AND APEX WALL   CARDIAC CATHETERIZATION N/A 07/27/2016   Procedure: Left Heart Cath and Cors/Grafts Angiography;  Surgeon: Nelva Bush, MD;  Location: [facility];  Service: Cardiovascular;  Laterality: N/A;   CORONARY ARTERY BYPASS GRAFT     TONSILLECTOMY     TOTAL HIP ARTHROPLASTY Right 03/12/2015   Procedure: RIGHT TOTAL HIP ARTHROPLASTY ANTERIOR APPROACH;  Surgeon: Paralee Cancel,  MD;  Location: [facility];  Service: Orthopedics;  Laterality: Right;     Current Outpatient Medications  Medication Sig Dispense Refill  amoxicillin (AMOXIL) 500 MG capsule Take 2,000 mg by mouth See admin instructions. [facility] procedures     aspirin EC 81 MG tablet Take 81 mg by mouth daily.     atorvastatin (LIPITOR) 40 MG tablet TAKE 1 TABLET DAILY 90 tablet 3   B-D INS SYR ULTRAFINE 1CC/31G 31G X 5/16" 1 ML MISC      BD PEN NEEDLE NANO U/F 32G X 4 MM MISC Inject 1 each into the skin as directed. With insulin  6   calcium-vitamin D (OSCAL-500) 500-400 MG-UNIT tablet Take 1 tablet by mouth 2 (two) times daily.      carvedilol (COREG) 3.125 MG tablet Take 1 tablet (3.125 mg total) by mouth 2 (two) times daily. 180 tablet 3   Coenzyme Q10 (COQ10) 200 MG CAPS Take 1 capsule by mouth every evening.      ezetimibe (ZETIA) 10 MG tablet TAKE 1 TABLET DAILY 90 tablet 3   insulin aspart (NOVOLOG FLEXPEN) 100 UNIT/ML FlexPen Inject 6 Units into the skin 3 (three) times daily with meals.     insulin degludec (TRESIBA FLEXTOUCH) 100 UNIT/ML FlexTouch Pen Inject 11 Units into the skin daily.     isosorbide mononitrate (IMDUR) 60 MG 24 hr tablet TAKE 1 TABLET BY MOUTH EVERY DAY 90 tablet 3   Multiple Vitamin (MULTIVITAMIN) tablet Take 1 tablet by mouth every morning.      ONE TOUCH ULTRA TEST test strip 2 (two) times daily. test blood sugar  5   ONETOUCH DELICA LANCETS 32I MISC CHECK BLOOD SUGARS 2 TIMES A DAY  5   Polyethyl Glycol-Propyl Glycol (SYSTANE OP) Apply 1 drop to eye daily as needed (dry scratchy eyes).     sacubitril-valsartan (ENTRESTO) 24-26 MG Take 1 tablet by mouth 2 (two) times daily. 60 tablet 11   sitaGLIPtin-metformin (JANUMET) 50-1000 MG tablet Take 1 tablet by mouth 2 (two) times daily with a meal.     vitamin C (ASCORBIC ACID) 500 MG tablet Take 500 mg by mouth every evening.      nitroGLYCERIN (NITROSTAT) 0.4 MG SL tablet Place 1 tablet (0.4 mg total) under the tongue every 5  (five) minutes as needed for chest pain. 25 tablet 3   No current facility-administered medications for this visit.    Allergies:   Patient has no known allergies.    Social History:  The patient  reports that she has never smoked. She has never used smokeless tobacco. She reports that she does not drink alcohol and does not use drugs.   Family History:  The patient's family history includes Cancer in her father; Heart attack in her mother; Stroke in her father.    ROS: Noted in current history, all other systems are negative.  Physical Exam: Blood pressure 140/80, pulse 71, height _0  (1.549 m), weight 126 lb 6.4 oz (57.3 kg), SpO2 99 %.  GEN:  Well nourished, well developed in no acute distress HEENT: Normal NECK: No JVD; No carotid bruits LYMPHATICS: No lymphadenopathy CARDIAC: RRR , reverse splittng of S2  RESPIRATORY:  Clear to auscultation without rales, wheezing or rhonchi  ABDOMEN: Soft, non-tender, non-distended MUSCULOSKELETAL:  No edema; No deformity  SKIN: Warm and dry NEUROLOGIC:  Alert and oriented x 3    EKG:       Recent Labs: 03/17/2021: ALT 19; BUN 5; Creatinine, Ser 0.56; Potassium 4.3; Sodium 135    Lipid Panel    Component Value Date/Time   CHOL 103 03/17/2021 0758   TRIG  50 03/17/2021 0758   HDL 49 03/17/2021 0758   CHOLHDL 2.1 03/17/2021 0758   CHOLHDL 2.5 01/09/2016 0952   VLDL 8 01/09/2016 0952   LDLCALC 42 03/17/2021 0758      Wt Readings from Last 3 Encounters:  04/16/21 126 lb 6.4 oz (57.3 kg)  02/20/21 123 lb (55.8 kg)  12/15/19 121 lb (54.9 kg)      Other studies Reviewed: Additional studies/ records that were reviewed today include: . Review of the above records demonstrates:    ASSESSMENT AND PLAN:  1. Coronary artery disease-status post PCI( April , 1998)  and CABG ( Nov. 1998) -  Her last heart catheterization was in December, 2017 which revealed an atretic LIMA.  Her native LAD has mild nonobstructive disease and has  stents.  She has 35% stenosis in the saphenous vein graft to the second diagonal.   2.   Chronic combined CHF:   developed a  LBBB several years ago .  She had an echocardiogram that revealed LVEF of 30 to 35%.  We stopped the amlodipine.  She was on Micardis.  We started carvedilol 3.125 mg twice a day. At this point we will discontinue the Micardis.  We will start Entresto 49-51 mg twice a day.  We will plan on repeating an echocardiogram in approximately 6 weeks and see her back in the office in 6 to 8 weeks.  She developed left bundle branch block several years ago.  I would like to repeat a stress Myoview study to rule out ischemia.  She has known occluded RCA so we would expect her to have an inferior scar.  3. Dyslipidemia -   stable     3.  Hypertension:    BP is fairly stable .   Starting [facility] which should help .     4.  Left carotid bruit: .- stable   5.  LBBB :     getting a lexiscan myoview  She now has associated CHF . Will cont medical therapy  The following changes have been made:  no change  Labs/ tests ordered today include:   Orders Placed This Encounter  Procedures   MYOCARDIAL PERFUSION IMAGING   ECHOCARDIOGRAM COMPLETE      Disposition:    Mertie Moores, MD  04/16/2021 2:59 PM    [facility] [place], [place], [place]  [address] Phone: [phone]; Fax: [phone]

## 2021-04-16 ENCOUNTER — Telehealth: Payer: Self-pay | Admitting: Cardiovascular Disease

## 2021-04-16 ENCOUNTER — Encounter: Payer: Self-pay | Admitting: Nurse Practitioner

## 2021-04-16 ENCOUNTER — Other Ambulatory Visit: Payer: Self-pay

## 2021-04-16 ENCOUNTER — Encounter: Payer: Self-pay | Admitting: Cardiovascular Disease

## 2021-04-16 ENCOUNTER — Ambulatory Visit (INDEPENDENT_AMBULATORY_CARE_PROVIDER_SITE_OTHER): Payer: Medicare Other | Admitting: Cardiovascular Disease

## 2021-04-16 VITALS — BP 140/80 | HR 71 | Ht 61.0 in | Wt 126.4 lb

## 2021-04-16 DIAGNOSIS — I2510 Atherosclerotic heart disease of native coronary artery without angina pectoris: Secondary | ICD-10-CM | POA: Diagnosis not present

## 2021-04-16 DIAGNOSIS — R0600 Dyspnea, unspecified: Secondary | ICD-10-CM

## 2021-04-16 DIAGNOSIS — I5043 Acute on chronic combined systolic (congestive) and diastolic (congestive) heart failure: Secondary | ICD-10-CM

## 2021-04-16 DIAGNOSIS — I447 Left bundle-branch block, unspecified: Secondary | ICD-10-CM

## 2021-04-16 DIAGNOSIS — R0609 Other forms of dyspnea: Secondary | ICD-10-CM

## 2021-04-16 MED ORDER — ENTRESTO 24-26 MG PO TABS: 1.0000 | ORAL_TABLET | Freq: Two times a day (BID) | ORAL | 11 refills | Status: DC

## 2021-04-16 NOTE — Addendum Note (Signed)
Addended by: Thayer Headings on: 04/16/2021 05:22 PM   Modules accepted: Orders

## 2021-04-16 NOTE — Telephone Encounter (Signed)
Returned call to patient to reschedule echo and office visit to be sooner than originally scheduled when she left the office today. I reviewed dates and times with her and she verbalized understanding and agreement. She thanked me for the call.

## 2021-04-16 NOTE — Telephone Encounter (Signed)
Patient stated she was returning call. Please advise

## 2021-04-16 NOTE — Telephone Encounter (Signed)
Spoke with the patient who states that she had a message from [place]. Patient saw Dr. Acie Fredrickson today and was set up for some tests. She thinks that it was in regards to moving some of her testing up.

## 2021-04-16 NOTE — Addendum Note (Signed)
Addended by: Emmaline Life on: 04/16/2021 04:07 PM   Modules accepted: Orders

## 2021-04-16 NOTE — Patient Instructions (Addendum)
Medication Instructions:  Your physician has recommended you make the following change in your medication:  STOP Micardis (Telmisartan) START Entresto (Valsartan-Sacubitril) 24-26 mg twice daily   *If you need a refill on your cardiac medications before your next appointment, please call your pharmacy*   Lab Work: None Ordered If you have labs (blood work) drawn today and your tests are completely normal, you will receive your results only by: [place] (if you have MyChart) OR A paper copy in the mail If you have any lab test that is abnormal or we need to change your treatment, we will call you to review the results.   Testing/Procedures: Your physician has requested that you have a lexiscan myoview. For further information please visit [email or web address]. Please follow instruction sheet, as given.  Your physician has requested that you have an echocardiogram in 2-3 months. Echocardiography is a painless test that uses sound waves to create images of your heart. It provides your doctor with information about the size and shape of your heart and how well your heart's chambers and valves are working. This procedure takes approximately one hour. There are no restrictions for this procedure.    Follow-Up: At [facility], you and your health needs are our priority.  As part of our continuing mission to provide you with exceptional heart care, we have created designated Provider Care Teams.  These Care Teams include your primary Cardiologist (physician) and Advanced Practice Providers (APPs -  Physician Assistants and Nurse Practitioners) who all work together to provide you with the care you need, when you need it.   Your next appointment:   8 week(s)  The format for your next appointment:   In Person  Provider:   Mertie Moores, MD

## 2021-04-29 ENCOUNTER — Other Ambulatory Visit: Payer: Self-pay

## 2021-04-29 ENCOUNTER — Ambulatory Visit (HOSPITAL_COMMUNITY): Payer: Medicare Other | Attending: Cardiovascular Disease

## 2021-04-29 DIAGNOSIS — E785 Hyperlipidemia, unspecified: Secondary | ICD-10-CM

## 2021-04-29 DIAGNOSIS — I447 Left bundle-branch block, unspecified: Secondary | ICD-10-CM | POA: Insufficient documentation

## 2021-04-29 DIAGNOSIS — I2510 Atherosclerotic heart disease of native coronary artery without angina pectoris: Secondary | ICD-10-CM | POA: Insufficient documentation

## 2021-04-29 DIAGNOSIS — R0600 Dyspnea, unspecified: Secondary | ICD-10-CM | POA: Insufficient documentation

## 2021-04-29 DIAGNOSIS — R0609 Other forms of dyspnea: Secondary | ICD-10-CM

## 2021-04-29 LAB — MYOCARDIAL PERFUSION IMAGING
Base ST Depression (mm): 0 mm
LV dias vol: 110 mL (ref 46–106)
LV sys vol: 67 mL
Nuc Stress EF: 39 %
Peak HR: 90 {beats}/min
Rest HR: 68 {beats}/min
Rest Nuclear Isotope Dose: 11 mCi
SDS: 5
SRS: 21
SSS: 28
ST Depression (mm): 0 mm
Stress Nuclear Isotope Dose: 32.2 mCi
TID: 1.13

## 2021-04-29 MED ORDER — TECHNETIUM TC 99M TETROFOSMIN IV KIT
11.0000 | PACK | Freq: Once | INTRAVENOUS | Status: AC | PRN
  Administered 2021-04-29: 11 via INTRAVENOUS
  Filled 2021-04-29: qty 11

## 2021-04-29 MED ORDER — REGADENOSON 0.4 MG/5ML IV SOLN
0.4000 mg | Freq: Once | INTRAVENOUS | Status: AC
Start: 2021-04-29 — End: 2021-04-29
  Administered 2021-04-29: 0.4 mg via INTRAVENOUS

## 2021-04-29 MED ORDER — TECHNETIUM TC 99M TETROFOSMIN IV KIT
32.2000 | PACK | Freq: Once | INTRAVENOUS | Status: AC | PRN
  Administered 2021-04-29: 32.2 via INTRAVENOUS
  Filled 2021-04-29: qty 33

## 2021-05-15 DIAGNOSIS — M810 Age-related osteoporosis without current pathological fracture: Secondary | ICD-10-CM | POA: Diagnosis not present

## 2021-05-15 DIAGNOSIS — E871 Hypo-osmolality and hyponatremia: Secondary | ICD-10-CM | POA: Diagnosis not present

## 2021-05-15 DIAGNOSIS — D696 Thrombocytopenia, unspecified: Secondary | ICD-10-CM | POA: Diagnosis not present

## 2021-05-15 DIAGNOSIS — E559 Vitamin D deficiency, unspecified: Secondary | ICD-10-CM | POA: Diagnosis not present

## 2021-05-15 DIAGNOSIS — Z23 Encounter for immunization: Secondary | ICD-10-CM | POA: Diagnosis not present

## 2021-05-15 DIAGNOSIS — I700 Atherosclerosis of aorta: Secondary | ICD-10-CM | POA: Diagnosis not present

## 2021-05-15 DIAGNOSIS — Z794 Long term (current) use of insulin: Secondary | ICD-10-CM | POA: Diagnosis not present

## 2021-05-15 DIAGNOSIS — E785 Hyperlipidemia, unspecified: Secondary | ICD-10-CM | POA: Diagnosis not present

## 2021-05-15 DIAGNOSIS — I2510 Atherosclerotic heart disease of native coronary artery without angina pectoris: Secondary | ICD-10-CM | POA: Diagnosis not present

## 2021-05-15 DIAGNOSIS — E1140 Type 2 diabetes mellitus with diabetic neuropathy, unspecified: Secondary | ICD-10-CM | POA: Diagnosis not present

## 2021-05-15 DIAGNOSIS — I10 Essential (primary) hypertension: Secondary | ICD-10-CM | POA: Diagnosis not present

## 2021-06-17 ENCOUNTER — Other Ambulatory Visit: Payer: Self-pay

## 2021-06-17 ENCOUNTER — Telehealth: Payer: Self-pay

## 2021-06-17 ENCOUNTER — Ambulatory Visit (HOSPITAL_COMMUNITY): Payer: Medicare Other | Attending: Cardiology

## 2021-06-17 DIAGNOSIS — I2510 Atherosclerotic heart disease of native coronary artery without angina pectoris: Secondary | ICD-10-CM | POA: Insufficient documentation

## 2021-06-17 DIAGNOSIS — I447 Left bundle-branch block, unspecified: Secondary | ICD-10-CM | POA: Insufficient documentation

## 2021-06-17 DIAGNOSIS — R0609 Other forms of dyspnea: Secondary | ICD-10-CM

## 2021-06-17 DIAGNOSIS — I509 Heart failure, unspecified: Secondary | ICD-10-CM

## 2021-06-17 LAB — ECHOCARDIOGRAM COMPLETE
Area-P 1/2: 1.49 cm2
MV M vel: 5.45 m/s
MV Peak grad: 118.8 mmHg
Radius: 0.6 cm
S' Lateral: 3.7 cm

## 2021-06-17 MED ORDER — PERFLUTREN LIPID MICROSPHERE
1.0000 mL | INTRAVENOUS | Status: AC | PRN
  Administered 2021-06-17: 1 mL via INTRAVENOUS

## 2021-06-17 NOTE — Telephone Encounter (Signed)
-----   Message from Thayer Headings, MD sent at 06/17/2021  4:28 PM EST ----- EF has continued to fall despite medical therapy. Please refer her to EP for consideration of BI-V pacer for her LBBB and worsening CHF

## 2021-06-17 NOTE — Telephone Encounter (Signed)
Spoke with pt and advised per Dr Acie Fredrickson EF( heart pumping function) continued to fall despite medical therapy and referral to EP for discussion re:Bi-V PPM is recommended.  Pt verbalizes understanding and agrees with current plan.

## 2021-06-19 ENCOUNTER — Telehealth: Payer: Self-pay | Admitting: Cardiovascular Disease

## 2021-06-19 NOTE — Telephone Encounter (Signed)
Cynthia [name] is calling wanting to know if she still needs her visit on 11/15 with Dr. Acie Fredrickson since he has referred her to EP. Transferred patient to [facility] to schedule EP referral before ending call.

## 2021-06-23 NOTE — Telephone Encounter (Signed)
Outreach made to Pt.  She is stable and glad to reschedule with DR. Nahser.  Advised would schedule f/u at her upcoming appt with DR. Lovena Le.

## 2021-06-24 ENCOUNTER — Other Ambulatory Visit (HOSPITAL_COMMUNITY): Payer: Medicare Other

## 2021-06-24 ENCOUNTER — Ambulatory Visit: Payer: Medicare Other | Admitting: Cardiovascular Disease

## 2021-07-08 ENCOUNTER — Ambulatory Visit (INDEPENDENT_AMBULATORY_CARE_PROVIDER_SITE_OTHER): Payer: Medicare Other | Admitting: Internal Medicine

## 2021-07-08 ENCOUNTER — Other Ambulatory Visit: Payer: Self-pay

## 2021-07-08 VITALS — BP 196/80 | HR 81 | Ht 61.0 in | Wt 125.0 lb

## 2021-07-08 DIAGNOSIS — I5022 Chronic systolic (congestive) heart failure: Secondary | ICD-10-CM

## 2021-07-08 DIAGNOSIS — I2510 Atherosclerotic heart disease of native coronary artery without angina pectoris: Secondary | ICD-10-CM

## 2021-07-08 DIAGNOSIS — I447 Left bundle-branch block, unspecified: Secondary | ICD-10-CM

## 2021-07-08 LAB — CBC WITH DIFFERENTIAL/PLATELET
Basophils Absolute: 0 10*3/uL (ref 0.0–0.2)
Basos: 0 %
EOS (ABSOLUTE): 0.1 10*3/uL (ref 0.0–0.4)
Eos: 1 %
Hematocrit: 39 % (ref 34.0–46.6)
Hemoglobin: 12.9 g/dL (ref 11.1–15.9)
Lymphocytes Absolute: 0.8 10*3/uL (ref 0.7–3.1)
Lymphs: 16 %
MCH: 31.9 pg (ref 26.6–33.0)
MCHC: 33.1 g/dL (ref 31.5–35.7)
MCV: 96 fL (ref 79–97)
Monocytes Absolute: 0.6 10*3/uL (ref 0.1–0.9)
Monocytes: 11 %
Neutrophils Absolute: 3.9 10*3/uL (ref 1.4–7.0)
Neutrophils: 72 %
Platelets: 115 10*3/uL — ABNORMAL LOW (ref 150–450)
RBC: 4.05 x10E6/uL (ref 3.77–5.28)
RDW: 14.6 % (ref 11.7–15.4)
WBC: 5.3 10*3/uL (ref 3.4–10.8)

## 2021-07-08 LAB — BASIC METABOLIC PANEL
BUN/Creatinine Ratio: 16 (ref 12–28)
BUN: 8 mg/dL (ref 8–27)
CO2: 27 mmol/L (ref 20–29)
Calcium: 9.4 mg/dL (ref 8.7–10.3)
Chloride: 94 mmol/L — ABNORMAL LOW (ref 96–106)
Creatinine, Ser: 0.5 mg/dL — ABNORMAL LOW (ref 0.57–1.00)
Glucose: 225 mg/dL — ABNORMAL HIGH (ref 70–99)
Potassium: 4.5 mmol/L (ref 3.5–5.2)
Sodium: 132 mmol/L — ABNORMAL LOW (ref 134–144)
eGFR: 91 mL/min/{1.73_m2} (ref >59)

## 2021-07-08 NOTE — Progress Notes (Signed)
HPI Cynthia [name] is referred by Dr. Acie Fredrickson for evaluation of and consideration for biv PM insertion. She is apleasant 87 yo woman with a h/o CAD, s/p MI, s/p PCI, s/p CABG. Over the past few months she has developed worsening CHF syjmptoms. Her echo demonstrates an EF of 25%. She has class 3 CHF symptoms. She has not had syncope. She has LBBB and a QRS duration of 152 ms. No edema. She denies medical or dietary non-compliance.  No Known Allergies   Current Outpatient Medications  Medication Sig Dispense Refill   amoxicillin (AMOXIL) 500 MG capsule Take 2,000 mg by mouth See admin instructions. [facility] procedures     aspirin EC 81 MG tablet Take 81 mg by mouth daily.     atorvastatin (LIPITOR) 40 MG tablet TAKE 1 TABLET DAILY 90 tablet 3   B-D INS SYR ULTRAFINE 1CC/31G 31G X 5/16" 1 ML MISC      BD PEN NEEDLE NANO U/F 32G X 4 MM MISC Inject 1 each into the skin as directed. With insulin  6   calcium-vitamin D (OSCAL-500) 500-400 MG-UNIT tablet Take 1 tablet by mouth 2 (two) times daily.      carvedilol (COREG) 3.125 MG tablet Take 1 tablet (3.125 mg total) by mouth 2 (two) times daily. 180 tablet 3   Coenzyme Q10 (COQ10) 200 MG CAPS Take 1 capsule by mouth every evening.      ezetimibe (ZETIA) 10 MG tablet TAKE 1 TABLET DAILY 90 tablet 3   insulin aspart (NOVOLOG FLEXPEN) 100 UNIT/ML FlexPen Inject 6 Units into the skin 3 (three) times daily with meals.     insulin degludec (TRESIBA FLEXTOUCH) 100 UNIT/ML FlexTouch Pen Inject 11 Units into the skin daily.     isosorbide mononitrate (IMDUR) 60 MG 24 hr tablet TAKE 1 TABLET BY MOUTH EVERY DAY 90 tablet 3   Multiple Vitamin (MULTIVITAMIN) tablet Take 1 tablet by mouth every morning.      ONE TOUCH ULTRA TEST test strip 2 (two) times daily. test blood sugar  5   ONETOUCH DELICA LANCETS 32I MISC CHECK BLOOD SUGARS 2 TIMES A DAY  5   Polyethyl Glycol-Propyl Glycol (SYSTANE OP) Apply 1 drop to eye daily as needed (dry scratchy eyes).      sacubitril-valsartan (ENTRESTO) 24-26 MG Take 1 tablet by mouth 2 (two) times daily. 60 tablet 11   sitaGLIPtin-metformin (JANUMET) 50-1000 MG tablet Take 1 tablet by mouth 2 (two) times daily with a meal.     vitamin C (ASCORBIC ACID) 500 MG tablet Take 500 mg by mouth every evening.      nitroGLYCERIN (NITROSTAT) 0.4 MG SL tablet Place 1 tablet (0.4 mg total) under the tongue every 5 (five) minutes as needed for chest pain. 25 tablet 3   No current facility-administered medications for this visit.     Past Medical History:  Diagnosis Date   Anxiety    Arthritis    Carotid bruit    LEFT   Coronary artery disease    STATUS POST PCI AND LATER CABG   Diabetes mellitus    Dyslipidemia    Dysrhythmia    hx of palpitations and PVC's    History of chicken pox    History of echocardiogram    Echo September 02, 2022: mild LVH, EF 55-60, no RWMA, Gr 1 DD, MAC, mild MR, mild LAE, mild TR, PASP 31   Hypertension    Measles    hx of  Mumps    hx of    Myocardial infarction ([place])    Palpitations    PVC's (premature ventricular contractions)     ROS:   All systems reviewed and negative except as noted in the HPI.   Past Surgical History:  Procedure Laterality Date   CARDIAC CATHETERIZATION  06/06/97   IT REVEALA MILD TO MODERATE LEFT VENTRICULAR SYSTOLIC DYSFUNCTION  WITH EF OF 40%. THERE IS PERSISTENT AKINESIS OF THE INFERIOR WALL. THERE IS MILD REGURGITATION PRESENT. THERE IS MILD HYPOKINESIS OF THE ANTERIOR  AND APEX WALL   CARDIAC CATHETERIZATION N/A 07/27/2016   Procedure: Left Heart Cath and Cors/Grafts Angiography;  Surgeon: Nelva Bush, MD;  Location: [facility];  Service: Cardiovascular;  Laterality: N/A;   CORONARY ARTERY BYPASS GRAFT     TONSILLECTOMY     TOTAL HIP ARTHROPLASTY Right 03/12/2015   Procedure: RIGHT TOTAL HIP ARTHROPLASTY ANTERIOR APPROACH;  Surgeon: Paralee Cancel, MD;  Location: [facility];  Service: Orthopedics;  Laterality: Right;     Family History   Problem Relation Age of Onset   Heart attack Mother    Stroke Father    Cancer Father      Social History   Socioeconomic History   Marital status: Widowed    Spouse name: Not on file   Number of children: Not on file   Years of education: Not on file   Highest education level: Not on file  Occupational History   Not on file  Tobacco Use   Smoking status: Never   Smokeless tobacco: Never  Vaping Use   Vaping Use: Never used  Substance and Sexual Activity   Alcohol use: No   Drug use: No   Sexual activity: Not on file  Other Topics Concern   Not on file  Social History Narrative   Not on file   Social Determinants of Health   Financial Resource Strain: Not on file  Food Insecurity: Not on file  Transportation Needs: Not on file  Physical Activity: Not on file  Stress: Not on file  Social Connections: Not on file  Intimate Partner Violence: Not on file     BP (!) 196/80   Pulse 81   Ht _0  (1.549 m)   Wt 125 lb (56.7 kg)   SpO2 98%   BMI 23.62 kg/m   Physical Exam:  Well appearing NAD HEENT: Unremarkable Neck:  No JVD, no thyromegally Lymphatics:  No adenopathy Back:  No CVA tenderness Lungs:  Clear with no wheezes.  HEART:  Regular rate rhythm, no murmurs, no rubs, no clicks Abd:  soft, positive bowel sounds, no organomegally, no rebound, no guarding Ext:  2 plus pulses, no edema, no cyanosis, no clubbing Skin:  No rashes no nodules Neuro:  CN II through XII intact, motor grossly intact  EKG - nsr with LBBB  Assess/Plan:  Chronic systolic heart failure -her symptoms are class 3 despite maximal medical therapy. I have discussed the treatment options. I have recommend insertion of BiV PPM and she will call if she would like to proceed. CAD - she denies anginal symptoms.  HTN -her bp is quite high today but is usually only minimally elevated at home. I would anticipate uptitration of her medical therapy after biv PM insertion.  Carleene Overlie Anwita Mencer,MD

## 2021-07-08 NOTE — H&P (View-Only) (Signed)
    HPI Cynthia [name] is referred by Dr. Nahser for evaluation of and consideration for biv PM insertion. She is apleasant 87 yo woman with a h/o CAD, s/p MI, s/p PCI, s/p CABG. Over the past few months she has developed worsening CHF syjmptoms. Her echo demonstrates an EF of 25%. She has class 3 CHF symptoms. She has not had syncope. She has LBBB and a QRS duration of 152 ms. No edema. She denies medical or dietary non-compliance.  No Known Allergies   Current Outpatient Medications  Medication Sig Dispense Refill   amoxicillin (AMOXIL) 500 MG capsule Take 2,000 mg by mouth See admin instructions. [facility] Dental procedures     aspirin EC 81 MG tablet Take 81 mg by mouth daily.     atorvastatin (LIPITOR) 40 MG tablet TAKE 1 TABLET DAILY 90 tablet 3   B-D INS SYR ULTRAFINE 1CC/31G 31G X 5/16" 1 ML MISC      BD PEN NEEDLE NANO U/F 32G X 4 MM MISC Inject 1 each into the skin as directed. With insulin  6   calcium-vitamin D (OSCAL-500) 500-400 MG-UNIT tablet Take 1 tablet by mouth 2 (two) times daily.      carvedilol (COREG) 3.125 MG tablet Take 1 tablet (3.125 mg total) by mouth 2 (two) times daily. 180 tablet 3   Coenzyme Q10 (COQ10) 200 MG CAPS Take 1 capsule by mouth every evening.      ezetimibe (ZETIA) 10 MG tablet TAKE 1 TABLET DAILY 90 tablet 3   insulin aspart (NOVOLOG FLEXPEN) 100 UNIT/ML FlexPen Inject 6 Units into the skin 3 (three) times daily with meals.     insulin degludec (TRESIBA FLEXTOUCH) 100 UNIT/ML FlexTouch Pen Inject 11 Units into the skin daily.     isosorbide mononitrate (IMDUR) 60 MG 24 hr tablet TAKE 1 TABLET BY MOUTH EVERY DAY 90 tablet 3   Multiple Vitamin (MULTIVITAMIN) tablet Take 1 tablet by mouth every morning.      ONE TOUCH ULTRA TEST test strip 2 (two) times daily. test blood sugar  5   ONETOUCH DELICA LANCETS 33G MISC CHECK BLOOD SUGARS 2 TIMES A DAY  5   Polyethyl Glycol-Propyl Glycol (SYSTANE OP) Apply 1 drop to eye daily as needed (dry scratchy eyes).      sacubitril-valsartan (ENTRESTO) 24-26 MG Take 1 tablet by mouth 2 (two) times daily. 60 tablet 11   sitaGLIPtin-metformin (JANUMET) 50-1000 MG tablet Take 1 tablet by mouth 2 (two) times daily with a meal.     vitamin C (ASCORBIC ACID) 500 MG tablet Take 500 mg by mouth every evening.      nitroGLYCERIN (NITROSTAT) 0.4 MG SL tablet Place 1 tablet (0.4 mg total) under the tongue every 5 (five) minutes as needed for chest pain. 25 tablet 3   No current facility-administered medications for this visit.     Past Medical History:  Diagnosis Date   Anxiety    Arthritis    Carotid bruit    LEFT   Coronary artery disease    STATUS POST PCI AND LATER CABG   Diabetes mellitus    Dyslipidemia    Dysrhythmia    hx of palpitations and PVC's    History of chicken pox    History of echocardiogram    Echo 1/18: mild LVH, EF 55-60, no RWMA, Gr 1 DD, MAC, mild MR, mild LAE, mild TR, PASP 31   Hypertension    Measles    hx of      Mumps    hx of    Myocardial infarction (HCC)    Palpitations    PVC's (premature ventricular contractions)     ROS:   All systems reviewed and negative except as noted in the HPI.   Past Surgical History:  Procedure Laterality Date   CARDIAC CATHETERIZATION  06/06/97   IT REVEALA MILD TO MODERATE LEFT VENTRICULAR SYSTOLIC DYSFUNCTION  WITH EF OF 40%. THERE IS PERSISTENT AKINESIS OF THE INFERIOR WALL. THERE IS MILD REGURGITATION PRESENT. THERE IS MILD HYPOKINESIS OF THE ANTERIOR  AND APEX WALL   CARDIAC CATHETERIZATION N/A 07/27/2016   Procedure: Left Heart Cath and Cors/Grafts Angiography;  Surgeon: Christopher End, MD;  Location: [facility];  Service: Cardiovascular;  Laterality: N/A;   CORONARY ARTERY BYPASS GRAFT     TONSILLECTOMY     TOTAL HIP ARTHROPLASTY Right 03/12/2015   Procedure: RIGHT TOTAL HIP ARTHROPLASTY ANTERIOR APPROACH;  Surgeon: Matthew Olin, MD;  Location: [facility];  Service: Orthopedics;  Laterality: Right;     Family History   Problem Relation Age of Onset   Heart attack Mother    Stroke Father    Cancer Father      Social History   Socioeconomic History   Marital status: Widowed    Spouse name: Not on file   Number of children: Not on file   Years of education: Not on file   Highest education level: Not on file  Occupational History   Not on file  Tobacco Use   Smoking status: Never   Smokeless tobacco: Never  Vaping Use   Vaping Use: Never used  Substance and Sexual Activity   Alcohol use: No   Drug use: No   Sexual activity: Not on file  Other Topics Concern   Not on file  Social History Narrative   Not on file   Social Determinants of Health   Financial Resource Strain: Not on file  Food Insecurity: Not on file  Transportation Needs: Not on file  Physical Activity: Not on file  Stress: Not on file  Social Connections: Not on file  Intimate Partner Violence: Not on file     BP (!) 196/80   Pulse 81   Ht 5' 1" (1.549 m)   Wt 125 lb (56.7 kg)   SpO2 98%   BMI 23.62 kg/m   Physical Exam:  Well appearing NAD HEENT: Unremarkable Neck:  No JVD, no thyromegally Lymphatics:  No adenopathy Back:  No CVA tenderness Lungs:  Clear with no wheezes.  HEART:  Regular rate rhythm, no murmurs, no rubs, no clicks Abd:  soft, positive bowel sounds, no organomegally, no rebound, no guarding Ext:  2 plus pulses, no edema, no cyanosis, no clubbing Skin:  No rashes no nodules Neuro:  CN II through XII intact, motor grossly intact  EKG - nsr with LBBB  Assess/Plan:  Chronic systolic heart failure -her symptoms are class 3 despite maximal medical therapy. I have discussed the treatment options. I have recommend insertion of BiV PPM and she will call if she would like to proceed. CAD - she denies anginal symptoms.  HTN -her bp is quite high today but is usually only minimally elevated at home. I would anticipate uptitration of her medical therapy after biv PM insertion.  Hildred Pharo,MD 

## 2021-07-08 NOTE — Patient Instructions (Addendum)
Medication Instructions:  Your physician recommends that you continue on your current medications as directed. Please refer to the Current Medication list given to you today.  Labwork: You will get lab work today:  CBC and BMP  Testing/Procedures: None ordered.  Follow-Up:  SEE INSTRUCTION LETTER  Any Other Special Instructions Will Be Listed Below (If Applicable).  If you need a refill on your cardiac medications before your next appointment, please call your pharmacy.   Biventricular Pacemaker Implantation Biventricular pacemaker implantation is a procedure to place (implant) a pacemaker near the heart. A biventricular pacemaker is a type of pacemaker used in people with heart failure due to weak heart muscles. This procedure may be done to: Treat symptoms of severe heart failure, such as shortness of breath. Correct a heartbeat that may be too slow or irregular. A pacemaker is a small, battery-powered device that helps control the heartbeat. If the heart beats irregularly or too slowly, the pacemaker will pace the heart so that it beats at a normal rate or a programmed rate. A biventricular pacemaker will synchronize the lower chambers of the heart (ventricles) so they contract at the same time. This can help them pump more efficiently. The parts of a biventricular pacemaker include: The pulse generator. The pulse generator contains a small computer that is programmed to keep the heart beating at a certain rate. The pulse generator also produces the electrical signal that triggers the heart to beat. This is implanted under the skin of the upper chest, near the collarbone. Wires (leads). There may be two or three leads placed in the heart--one to the right atrium, one to the right ventricle, and one through the coronary sinus to reach the left ventricle of the heart. The leads are connected to the pulse generator. They transmit electrical pulses from the pulse generator to the heart. Tell a  health care provider about: Any allergies you have. All medicines you are taking, including vitamins, herbs, eye drops, creams, and over-the-counter medicines. Any problems you or family members have had with anesthetic medicines. Any bleeding problems you have. Any surgeries you have had. Any medical conditions you have. Whether you are pregnant or may be pregnant. What are the risks? Generally, this is a safe procedure. However, problems may occur, including: Infection. Swelling, bruising, or bleeding at the pacemaker site, especially if you take blood thinners. Allergic reactions to medicines or dyes. Damage to blood vessels or nerves near the pacemaker. Failure of the pacemaker to improve your condition. Collapsed lung. Blood clots. Lead failures. This may require more surgery. What happens before the procedure? Staying hydrated Follow instructions from your health care provider about hydration, which may include: Up to 2 hours before the procedure - you may continue to drink clear liquids, such as water, clear fruit juice, black coffee, and plain tea.  Eating and drinking restrictions Follow instructions from your health care provider about eating and drinking, which may include: 8 hours before the procedure - stop eating heavy meals or foods, such as meat, fried foods, or fatty foods. 6 hours before the procedure - stop eating light meals or foods, such as toast or cereal. 6 hours before the procedure - stop drinking milk or drinks that contain milk. 2 hours before the procedure - stop drinking clear liquids. Medicines Ask your health care provider about: Changing or stopping your regular medicines. This is especially important if you are taking diabetes medicines or blood thinners. Taking medicines such as aspirin and ibuprofen. These medicines  can thin your blood. Do not take these medicines unless your health care provider tells you to take them. Taking over-the-counter  medicines, vitamins, herbs, and supplements. General instructions Take steps to improve your health and fitness as told. Stopping smoking, eating a healthy diet, and exercising regularly can help speed up your recovery time and reduce the risk of complications. You may have tests, including: Blood tests. Chest X-rays. Echocardiogram. This is a test that uses sound waves (ultrasound) to produce an image of the heart. Ask your health care provider: How your surgery site will be marked. What steps will be taken to help prevent infection. These may include: Removing hair at the surgery site. Washing skin with a germ-killing soap. Taking antibiotic medicine. Plan to have a responsible adult take you home from the hospital or clinic. If you will be going home right after the procedure, plan to have a responsible adult care for you for the time you are told. This is important. What happens during the procedure?  An IV will be inserted into one of your veins. You will be connected to a heart monitor. Large electrode pads will be placed on the front and back of your chest. You will be given one or more of the following: A medicine to help you relax (sedative). A medicine to make you fall asleep (general anesthetic). A medicine that is injected into an area of your body to numb the area (local anesthetic). An incision will be made in your upper chest, near your heart. The leads will be guided into your incision, through your blood vessels, and into your heart. Your health care provider will use an X-ray machine (fluoroscope) to guide the leads into your heart. The leads will be attached to your heart muscles and to the pulse generator. The leads will be tested to make sure that they work correctly. The pulse generator will be implanted under your skin, near your incision. The heart monitor will be watched to ensure that the pacemaker is working correctly. Your incision will be closed with stitches  (sutures), skin glue, or adhesive tape. A bandage (dressing) will be placed over your incision. The procedure may vary among health care providers and hospitals. What happens after the procedure? Your blood pressure, heart rate, breathing rate, and blood oxygen level will be monitored until you leave the hospital or clinic. You may continue to receive fluids and medicines through an IV. You will be given pain medicine as needed. You will have a chest X-ray done. This is to make sure that your pacemaker is in the right place. You will be given a pacemaker identification card. This card lists the implant date, device model, and manufacturer of your pacemaker. If you were given a sedative during the procedure, it can affect you for several hours. Do not drive or operate machinery until your health care provider says that it is safe. Summary A pacemaker is a small, battery-powered device that helps control the heartbeat. A biventricular pacemaker is used in people with heart failure to get the ventricles of the heart to pump more efficiently. Follow instructions from your health care provider about taking medicines and about eating and drinking before the procedure. You will be given a pacemaker identification card that lists the implant date, device model, and manufacturer of your pacemaker. This information is not intended to replace advice given to you by your health care provider. Make sure you discuss any questions you have with your health care provider. Document Revised:  11/22/2020 Document Reviewed: 11/22/2020 Elsevier Patient Education  [place].

## 2021-07-14 ENCOUNTER — Ambulatory Visit: Payer: Medicare Other | Admitting: Cardiovascular Disease

## 2021-07-15 DIAGNOSIS — I10 Essential (primary) hypertension: Secondary | ICD-10-CM | POA: Diagnosis not present

## 2021-07-15 DIAGNOSIS — Z794 Long term (current) use of insulin: Secondary | ICD-10-CM | POA: Diagnosis not present

## 2021-07-15 DIAGNOSIS — E785 Hyperlipidemia, unspecified: Secondary | ICD-10-CM | POA: Diagnosis not present

## 2021-07-15 DIAGNOSIS — E1140 Type 2 diabetes mellitus with diabetic neuropathy, unspecified: Secondary | ICD-10-CM | POA: Diagnosis not present

## 2021-07-15 DIAGNOSIS — I2510 Atherosclerotic heart disease of native coronary artery without angina pectoris: Secondary | ICD-10-CM | POA: Diagnosis not present

## 2021-07-15 DIAGNOSIS — I5189 Other ill-defined heart diseases: Secondary | ICD-10-CM | POA: Diagnosis not present

## 2021-07-16 DIAGNOSIS — Z1231 Encounter for screening mammogram for malignant neoplasm of breast: Secondary | ICD-10-CM | POA: Diagnosis not present

## 2021-07-21 ENCOUNTER — Ambulatory Visit (HOSPITAL_COMMUNITY): Payer: Medicare Other

## 2021-07-21 ENCOUNTER — Ambulatory Visit (HOSPITAL_COMMUNITY)
Admission: RE | Disposition: A | Discharge status: Home / Self Care | Payer: Self-pay | Source: Ambulatory Visit | Attending: Internal Medicine

## 2021-07-21 ENCOUNTER — Ambulatory Visit (HOSPITAL_COMMUNITY)
Admission: RE | Admit: 2021-07-21 | Discharge: 2021-07-21 | Disposition: A | Discharge status: Home / Self Care | Payer: Medicare Other | Source: Ambulatory Visit | Attending: Internal Medicine | Admitting: Internal Medicine

## 2021-07-21 ENCOUNTER — Other Ambulatory Visit: Payer: Self-pay

## 2021-07-21 DIAGNOSIS — I110 Hypertensive heart disease with heart failure: Secondary | ICD-10-CM | POA: Insufficient documentation

## 2021-07-21 DIAGNOSIS — I255 Ischemic cardiomyopathy: Secondary | ICD-10-CM | POA: Insufficient documentation

## 2021-07-21 DIAGNOSIS — I2510 Atherosclerotic heart disease of native coronary artery without angina pectoris: Secondary | ICD-10-CM | POA: Insufficient documentation

## 2021-07-21 DIAGNOSIS — Z955 Presence of coronary angioplasty implant and graft: Secondary | ICD-10-CM | POA: Insufficient documentation

## 2021-07-21 DIAGNOSIS — Z950 Presence of cardiac pacemaker: Secondary | ICD-10-CM

## 2021-07-21 DIAGNOSIS — Z951 Presence of aortocoronary bypass graft: Secondary | ICD-10-CM | POA: Insufficient documentation

## 2021-07-21 DIAGNOSIS — I5022 Chronic systolic (congestive) heart failure: Secondary | ICD-10-CM | POA: Insufficient documentation

## 2021-07-21 DIAGNOSIS — I447 Left bundle-branch block, unspecified: Secondary | ICD-10-CM | POA: Diagnosis not present

## 2021-07-21 DIAGNOSIS — I252 Old myocardial infarction: Secondary | ICD-10-CM | POA: Insufficient documentation

## 2021-07-21 HISTORY — PX: BIV PACEMAKER INSERTION CRT-P: EP1199

## 2021-07-21 LAB — GLUCOSE, CAPILLARY
Glucose-Capillary: 112 mg/dL — ABNORMAL HIGH (ref 70–99)
Glucose-Capillary: 138 mg/dL — ABNORMAL HIGH (ref 70–99)

## 2021-07-21 SURGERY — BIV PACEMAKER INSERTION CRT-P

## 2021-07-21 MED ORDER — IOHEXOL 350 MG/ML SOLN
INTRAVENOUS | Status: DC | PRN
  Administered 2021-07-21: 5 mL

## 2021-07-21 MED ORDER — LIDOCAINE HCL 1 % IJ SOLN
INTRAMUSCULAR | Status: AC
  Filled 2021-07-21: qty 20

## 2021-07-21 MED ORDER — MIDAZOLAM HCL 5 MG/5ML IJ SOLN
INTRAMUSCULAR | Status: AC
  Filled 2021-07-21: qty 5

## 2021-07-21 MED ORDER — SODIUM CHLORIDE 0.9 % IV SOLN
80.0000 mg | INTRAVENOUS | Status: AC
  Administered 2021-07-21: 80 mg

## 2021-07-21 MED ORDER — CHLORHEXIDINE GLUCONATE 4 % EX LIQD: 4.0000 "application " | Freq: Once | CUTANEOUS | Status: DC

## 2021-07-21 MED ORDER — FENTANYL CITRATE (PF) 100 MCG/2ML IJ SOLN
INTRAMUSCULAR | Status: AC
  Filled 2021-07-21: qty 2

## 2021-07-21 MED ORDER — HEPARIN (PORCINE) IN NACL 2000-0.9 UNIT/L-% IV SOLN
INTRAVENOUS | Status: DC | PRN
  Administered 2021-07-21: 1000 mL

## 2021-07-21 MED ORDER — CEFAZOLIN SODIUM-DEXTROSE 1-4 GM/50ML-% IV SOLN
1.0000 g | Freq: Once | INTRAVENOUS | Status: AC
  Administered 2021-07-21: 1 g via INTRAVENOUS

## 2021-07-21 MED ORDER — HEPARIN (PORCINE) IN NACL 1000-0.9 UT/500ML-% IV SOLN
INTRAVENOUS | Status: AC
  Filled 2021-07-21: qty 500

## 2021-07-21 MED ORDER — SODIUM CHLORIDE 0.9 % IV SOLN: INTRAVENOUS | Status: DC

## 2021-07-21 MED ORDER — ONDANSETRON HCL 4 MG/2ML IJ SOLN: 4.0000 mg | Freq: Four times a day (QID) | INTRAMUSCULAR | Status: DC | PRN

## 2021-07-21 MED ORDER — CEFAZOLIN SODIUM-DEXTROSE 2-4 GM/100ML-% IV SOLN
INTRAVENOUS | Status: AC
  Filled 2021-07-21: qty 100

## 2021-07-21 MED ORDER — ACETAMINOPHEN 325 MG PO TABS: 325.0000 mg | ORAL_TABLET | ORAL | Status: DC | PRN

## 2021-07-21 MED ORDER — POVIDONE-IODINE 10 % EX SWAB
2.0000 "application " | Freq: Once | CUTANEOUS | Status: AC
  Administered 2021-07-21: 2 via TOPICAL

## 2021-07-21 MED ORDER — CEFAZOLIN SODIUM-DEXTROSE 2-4 GM/100ML-% IV SOLN
2.0000 g | INTRAVENOUS | Status: AC
  Administered 2021-07-21: 2 g via INTRAVENOUS

## 2021-07-21 MED ORDER — LIDOCAINE HCL (PF) 1 % IJ SOLN
INTRAMUSCULAR | Status: DC | PRN
  Administered 2021-07-21: 60 mL

## 2021-07-21 MED ORDER — SODIUM CHLORIDE 0.9 % IV SOLN
INTRAVENOUS | Status: AC
  Filled 2021-07-21: qty 2

## 2021-07-21 MED ORDER — FENTANYL CITRATE (PF) 100 MCG/2ML IJ SOLN
INTRAMUSCULAR | Status: DC | PRN
  Administered 2021-07-21 (×2): 12.5 ug via INTRAVENOUS

## 2021-07-21 MED ORDER — MIDAZOLAM HCL 5 MG/5ML IJ SOLN
INTRAMUSCULAR | Status: DC | PRN
  Administered 2021-07-21 (×2): 1 mg via INTRAVENOUS

## 2021-07-21 SURGICAL SUPPLY — 15 items
CABLE SURGICAL S-101-97-12 (CABLE) ×3 IMPLANT
CATH ATTAIN COM SURV 6250V-MB2 (CATHETERS) ×1 IMPLANT
CATH JOSEPH QUAD ALLRED 6F REP (CATHETERS) ×1 IMPLANT
CATH RIGHTSITE C315HIS02 (CATHETERS) ×1 IMPLANT
DEVICE CRTP PERCEPTA QUAD MRI (Pacemaker) ×3 IMPLANT
LEAD ATTAIN QUAD 4798-78 (Lead) ×1 IMPLANT
LEAD CAPSURE NOVUS 5076-52CM (Lead) ×1 IMPLANT
LEAD SELECT SECURE 3830 383069 (Lead) IMPLANT
PAD DEFIB RADIO PHYSIO CONN (PAD) ×3 IMPLANT
SELECT SECURE 3830 383069 (Lead) ×2 IMPLANT
SHEATH 7FR PRELUDE SNAP 13 (SHEATH) ×2 IMPLANT
SHEATH 9.5FR PRELUDE SNAP 13 (SHEATH) ×1 IMPLANT
TRAY PACEMAKER INSERTION (PACKS) ×3 IMPLANT
WIRE ACUITY WHISPER EDS 4648 (WIRE) ×1 IMPLANT
WIRE HI TORQ VERSACORE-J 145CM (WIRE) ×1 IMPLANT

## 2021-07-21 NOTE — Discharge Instructions (Signed)
After Your Pacemaker   You have a Medtronic Pacemaker  ACTIVITY Do not lift your arm above shoulder height for 1 week after your procedure. After 7 days, you may progress as below.  You should remove your sling 24 hours after your procedure, unless otherwise instructed by your provider.     Monday July 28, 2021  Tuesday July 29, 2021 Wednesday July 30, 2021 Thursday July 31, 2021   Do not lift, push, pull, or carry anything over 10 pounds with the affected arm until 6 weeks (Monday September 01, 2021 ) after your procedure.   You may drive AFTER your wound check, unless you have been told otherwise by your provider.   Ask your healthcare provider when you can go back to work   INCISION/Dressing If you are on a blood thinner such as Coumadin, Xarelto, Eliquis, Plavix, or Pradaxa please confirm with your provider when this should be resumed.   If large square, outer bandage is left in place, this can be removed after 24 hours from your procedure. Do not remove steri-strips or glue as below.   Monitor your Pacemaker site for redness, swelling, and drainage. Call the device clinic at [phone] if you experience these symptoms or fever/chills.  If your incision is sealed with Steri-strips or staples, you may shower 10 days after your procedure or when told by your provider. Do not remove the steri-strips or let the shower hit directly on your site. You may wash around your site with soap and water.    If you were discharged in a sling, please do not wear this during the day more than 48 hours after your surgery unless otherwise instructed. This may increase the risk of stiffness and soreness in your shoulder.   Avoid lotions, ointments, or perfumes over your incision until it is well-healed.  You may use a hot tub or a pool AFTER your wound check appointment if the incision is completely closed.  [employer] Alerts:  Some alerts are vibratory and others beep. These are NOT  emergencies. Please call our office to let us know. If this occurs at night or on weekends, it can wait until the next business day. Send a remote transmission.  If your device is capable of reading fluid status (for heart failure), you will be offered monthly monitoring to review this with you.   DEVICE MANAGEMENT Remote monitoring is used to monitor your pacemaker from home. This monitoring is scheduled every 91 days by our office. It allows [place] to keep an eye on the functioning of your device to ensure it is working properly. You will routinely see your Electrophysiologist annually (more often if necessary).   You should receive your ID card for your new device in 4-8 weeks. Keep this card with you at all times once received. Consider wearing a medical alert bracelet or necklace.  Your Pacemaker may be MRI compatible. This will be discussed at your next office visit/wound check.  You should avoid contact with strong electric or magnetic fields.   Do not use amateur (ham) radio equipment or electric (arc) welding torches. MP3 player headphones with magnets should not be used. Some devices are safe to use if held at least 12 inches (30 cm) from your Pacemaker. These include power tools, lawn mowers, and speakers. If you are unsure if something is safe to use, ask your health care provider.  When using your cell phone, hold it to the ear that is on the opposite side from the  Pacemaker. Do not leave your cell phone in a pocket over the Pacemaker.  You may safely use electric blankets, heating pads, computers, and microwave ovens.  Call the office right away if: You have chest pain. You feel more short of breath than you have felt before. You feel more light-headed than you have felt before. Your incision starts to open up.  This information is not intended to replace advice given to you by your health care provider. Make sure you discuss any questions you have with your health care provider.

## 2021-07-21 NOTE — Interval H&P Note (Signed)
History and Physical Interval Note:  07/21/2021 7:27 AM  Cynthia [name]  has presented today for surgery, with the diagnosis of cardiomyopath - left bundle branch block.  The various methods of treatment have been discussed with the patient and family. After consideration of risks, benefits and other options for treatment, the patient has consented to  Procedure(s): BIV PACEMAKER INSERTION CRT-P (N/A) as a surgical intervention.  The patient's history has been reviewed, patient examined, no change in status, stable for surgery.  I have reviewed the patient's chart and labs.  Questions were answered to the patient's satisfaction.     Cristopher Peru

## 2021-07-21 NOTE — Progress Notes (Signed)
EKG obtained and reviewed with [place], [place].

## 2021-07-22 ENCOUNTER — Encounter (HOSPITAL_COMMUNITY): Payer: Self-pay | Admitting: Internal Medicine

## 2021-07-22 MED FILL — Lidocaine HCl Local Inj 1%: INTRAMUSCULAR | Qty: 60 | Status: AC

## 2021-07-23 ENCOUNTER — Telehealth: Payer: Self-pay

## 2021-07-23 NOTE — Telephone Encounter (Signed)
Follow-up after same day discharge: Implant date: 07/21/21 MD: Cristopher Peru, MD Device: Medtronic CRT-P Location: Left chest   Wound check visit: 07/31/21 90 day MD follow-up: 11/04/20  Remote Transmission received:yes   Dressing removed: yes- reviewed s/s of infection to monitor for and report.  Reviewed activity restriction.

## 2021-07-31 ENCOUNTER — Other Ambulatory Visit: Payer: Self-pay

## 2021-07-31 ENCOUNTER — Ambulatory Visit (INDEPENDENT_AMBULATORY_CARE_PROVIDER_SITE_OTHER): Payer: Medicare Other

## 2021-07-31 DIAGNOSIS — I5022 Chronic systolic (congestive) heart failure: Secondary | ICD-10-CM

## 2021-07-31 LAB — CUP PACEART INCLINIC DEVICE CHECK
Battery Remaining Longevity: 116 mo
Battery Voltage: 3.21 V
Brady Statistic AP VP Percent: 2.65 %
Brady Statistic AP VS Percent: 0.02 %
Brady Statistic AS VP Percent: 96.78 %
Brady Statistic AS VS Percent: 0.48 %
Brady Statistic RA Percent Paced: 2.43 %
Brady Statistic RV Percent Paced: 96.43 %
Date Time Interrogation Session: 20221222113332
Implantable Lead Implant Date: 20221212
Implantable Lead Implant Date: 20221212
Implantable Lead Implant Date: 20221212
Implantable Lead Location: 753858
Implantable Lead Location: 753859
Implantable Lead Location: 753860
Implantable Lead Model: 3830
Implantable Lead Model: 4798
Implantable Lead Model: 5076
Implantable Pulse Generator Implant Date: 20221212
Lead Channel Impedance Value: 323 Ohm
Lead Channel Impedance Value: 399 Ohm
Lead Channel Impedance Value: 456 Ohm
Lead Channel Impedance Value: 494 Ohm
Lead Channel Impedance Value: 513 Ohm
Lead Channel Impedance Value: 551 Ohm
Lead Channel Impedance Value: 627 Ohm
Lead Channel Impedance Value: 665 Ohm
Lead Channel Impedance Value: 665 Ohm
Lead Channel Impedance Value: 703 Ohm
Lead Channel Impedance Value: 741 Ohm
Lead Channel Impedance Value: 817 Ohm
Lead Channel Impedance Value: 931 Ohm
Lead Channel Impedance Value: 988 Ohm
Lead Channel Pacing Threshold Amplitude: 0.5 V
Lead Channel Pacing Threshold Amplitude: 0.875 V
Lead Channel Pacing Threshold Amplitude: 1.125 V
Lead Channel Pacing Threshold Pulse Width: 0.4 ms
Lead Channel Pacing Threshold Pulse Width: 0.4 ms
Lead Channel Pacing Threshold Pulse Width: 0.6 ms
Lead Channel Sensing Intrinsic Amplitude: 1 mV
Lead Channel Sensing Intrinsic Amplitude: 1.125 mV
Lead Channel Sensing Intrinsic Amplitude: 22 mV
Lead Channel Sensing Intrinsic Amplitude: 22.25 mV
Lead Channel Setting Pacing Amplitude: 2.5 V
Lead Channel Setting Pacing Amplitude: 3.5 V
Lead Channel Setting Pacing Amplitude: 3.5 V
Lead Channel Setting Pacing Pulse Width: 0.4 ms
Lead Channel Setting Pacing Pulse Width: 0.6 ms
Lead Channel Setting Sensing Sensitivity: 0.9 mV

## 2021-07-31 NOTE — Patient Instructions (Signed)
° °  After Your Pacemaker   Monitor your pacemaker site for redness, increased swelling/hardening, and drainage. Call the device clinic at [phone] if you experience these symptoms or fever/chills.  Your incision was closed with Steri-strips or staples:  You may shower 7 days after your procedure and wash your incision with soap and water. Avoid lotions, ointments, or perfumes over your incision until it is well-healed.  You may use a hot tub or a pool after your wound check appointment if the incision is completely closed.  Do not lift, push or pull greater than 10 pounds with the affected arm until 6 weeks after your procedure. There are no other restrictions in arm movement after your wound check appointment.  You may drive, unless driving has been restricted by your healthcare providers.  Remote monitoring is used to monitor your pacemaker from home. This monitoring is scheduled every 91 days by our office. It allows [place] to keep an eye on the functioning of your device to ensure it is working properly. You will routinely see your Electrophysiologist annually (more often if necessary).

## 2021-07-31 NOTE — Progress Notes (Signed)
Wound check appointment. Steri-strips removed. Wound without redness. Hematoma noted at incision with yellow/purple bruising. Soft on palpitation. Incision edges approximated. Dr. Curt Bears in to assess and recommends continue to monitor. Wound education provided to patient. Normal device function. Thresholds, sensing, and impedances consistent with implant measurements. Device programmed at 3.5V/auto capture programmed on for extra safety margin until 3 month visit. Histogram distribution appropriate for patient and level of activity. AT/AF burden 16.9%, 3 EGM's reviewed, appear AF with max VR 136 bpm, longest in duration 29 minutes 42 seconds. No OAC on file. Patient educated about wound care, arm mobility, lifting restrictions. ROV in 3 months with Dr. Lovena Le.  Forwarding for Dr. Lovena Le to review in regard to ? AFL and no OAC on file. Also, please review photos. Dr. Curt Bears assessed and advised continue to monitor.

## 2021-08-29 ENCOUNTER — Encounter (HOSPITAL_COMMUNITY): Payer: Self-pay | Admitting: Internal Medicine

## 2021-09-16 DIAGNOSIS — D696 Thrombocytopenia, unspecified: Secondary | ICD-10-CM | POA: Diagnosis not present

## 2021-09-16 DIAGNOSIS — I10 Essential (primary) hypertension: Secondary | ICD-10-CM | POA: Diagnosis not present

## 2021-09-16 DIAGNOSIS — E1140 Type 2 diabetes mellitus with diabetic neuropathy, unspecified: Secondary | ICD-10-CM | POA: Diagnosis not present

## 2021-09-16 DIAGNOSIS — E042 Nontoxic multinodular goiter: Secondary | ICD-10-CM | POA: Diagnosis not present

## 2021-09-16 DIAGNOSIS — E785 Hyperlipidemia, unspecified: Secondary | ICD-10-CM | POA: Diagnosis not present

## 2021-09-16 DIAGNOSIS — I2510 Atherosclerotic heart disease of native coronary artery without angina pectoris: Secondary | ICD-10-CM | POA: Diagnosis not present

## 2021-09-16 DIAGNOSIS — Z794 Long term (current) use of insulin: Secondary | ICD-10-CM | POA: Diagnosis not present

## 2021-09-16 DIAGNOSIS — E559 Vitamin D deficiency, unspecified: Secondary | ICD-10-CM | POA: Diagnosis not present

## 2021-09-16 DIAGNOSIS — D500 Iron deficiency anemia secondary to blood loss (chronic): Secondary | ICD-10-CM | POA: Diagnosis not present

## 2021-09-16 DIAGNOSIS — I700 Atherosclerosis of aorta: Secondary | ICD-10-CM | POA: Diagnosis not present

## 2021-09-16 DIAGNOSIS — M810 Age-related osteoporosis without current pathological fracture: Secondary | ICD-10-CM | POA: Diagnosis not present

## 2021-10-20 DIAGNOSIS — L821 Other seborrheic keratosis: Secondary | ICD-10-CM | POA: Diagnosis not present

## 2021-10-20 DIAGNOSIS — Z85828 Personal history of other malignant neoplasm of skin: Secondary | ICD-10-CM | POA: Diagnosis not present

## 2021-10-20 DIAGNOSIS — L814 Other melanin hyperpigmentation: Secondary | ICD-10-CM | POA: Diagnosis not present

## 2021-10-21 ENCOUNTER — Ambulatory Visit (INDEPENDENT_AMBULATORY_CARE_PROVIDER_SITE_OTHER): Payer: Medicare Other

## 2021-10-21 DIAGNOSIS — I5022 Chronic systolic (congestive) heart failure: Secondary | ICD-10-CM

## 2021-10-22 ENCOUNTER — Ambulatory Visit (HOSPITAL_COMMUNITY): Payer: Medicare Other | Admitting: Nurse Practitioner

## 2021-10-22 ENCOUNTER — Telehealth: Payer: Self-pay

## 2021-10-22 DIAGNOSIS — I480 Paroxysmal atrial fibrillation: Secondary | ICD-10-CM

## 2021-10-22 HISTORY — DX: Paroxysmal atrial fibrillation: I48.0

## 2021-10-22 LAB — CUP PACEART REMOTE DEVICE CHECK
Battery Remaining Longevity: 105 mo
Battery Voltage: 3.15 V
Brady Statistic AP VP Percent: 18.13 %
Brady Statistic AP VS Percent: 0.03 %
Brady Statistic AS VP Percent: 81.48 %
Brady Statistic AS VS Percent: 0.31 %
Brady Statistic RA Percent Paced: 15.26 %
Brady Statistic RV Percent Paced: 95.69 %
Date Time Interrogation Session: 20230315101252
Implantable Lead Implant Date: 20221212
Implantable Lead Implant Date: 20221212
Implantable Lead Implant Date: 20221212
Implantable Lead Location: 753858
Implantable Lead Location: 753859
Implantable Lead Location: 753860
Implantable Lead Model: 3830
Implantable Lead Model: 4798
Implantable Lead Model: 5076
Implantable Pulse Generator Implant Date: 20221212
Lead Channel Impedance Value: 304 Ohm
Lead Channel Impedance Value: 304 Ohm
Lead Channel Impedance Value: 342 Ohm
Lead Channel Impedance Value: 361 Ohm
Lead Channel Impedance Value: 418 Ohm
Lead Channel Impedance Value: 418 Ohm
Lead Channel Impedance Value: 475 Ohm
Lead Channel Impedance Value: 513 Ohm
Lead Channel Impedance Value: 589 Ohm
Lead Channel Impedance Value: 589 Ohm
Lead Channel Impedance Value: 608 Ohm
Lead Channel Impedance Value: 627 Ohm
Lead Channel Impedance Value: 646 Ohm
Lead Channel Impedance Value: 684 Ohm
Lead Channel Pacing Threshold Amplitude: 0.5 V
Lead Channel Pacing Threshold Amplitude: 0.75 V
Lead Channel Pacing Threshold Amplitude: 1 V
Lead Channel Pacing Threshold Pulse Width: 0.4 ms
Lead Channel Pacing Threshold Pulse Width: 0.4 ms
Lead Channel Pacing Threshold Pulse Width: 0.6 ms
Lead Channel Sensing Intrinsic Amplitude: 0.625 mV
Lead Channel Sensing Intrinsic Amplitude: 0.625 mV
Lead Channel Sensing Intrinsic Amplitude: 25 mV
Lead Channel Sensing Intrinsic Amplitude: 25 mV
Lead Channel Setting Pacing Amplitude: 1.5 V
Lead Channel Setting Pacing Amplitude: 3.5 V
Lead Channel Setting Pacing Amplitude: 3.5 V
Lead Channel Setting Pacing Pulse Width: 0.4 ms
Lead Channel Setting Pacing Pulse Width: 0.6 ms
Lead Channel Setting Sensing Sensitivity: 0.9 mV

## 2021-10-22 NOTE — Telephone Encounter (Signed)
Scheduled remote reviewed. Normal device function.   ?Ongoing AF/flutter since 3/11 @ 19:33, histogram >100 bpm ~40% ?Optivol crossed threshold 2/28 and is ongoing ?Burden 18.6%, Coreg, ASA, no OAC ?Route to triage per protocol ?Next remote 91 days. ? ? ?Successful telephone encounter to patient to follow up on s/s AF/AFL. Patient states she feels well except for no stamina and shortness of breath with exertion. This appears to be new onset since device was placed. No OAC. Longest episode 97 hours 09/26/21. Most recent is ongoing from 10/18/21. Consulted with AF clinic. Appointment made for 10/23/21 at 11:30 am. Routing to AF Clinic providers and Dr. Lovena Le for review. ED precautions provided to patient along with directions to AF clinic and contact.  ? ? ? ? ? ? ?

## 2021-10-23 ENCOUNTER — Ambulatory Visit (HOSPITAL_COMMUNITY)
Admission: RE | Admit: 2021-10-23 | Discharge: 2021-10-23 | Disposition: A | Discharge status: Home / Self Care | Payer: Medicare Other | Source: Ambulatory Visit | Attending: Nurse Practitioner | Admitting: Nurse Practitioner

## 2021-10-23 ENCOUNTER — Other Ambulatory Visit: Payer: Self-pay

## 2021-10-23 ENCOUNTER — Encounter (HOSPITAL_COMMUNITY): Payer: Self-pay | Admitting: Physician Assistant

## 2021-10-23 VITALS — BP 156/92 | HR 93 | Ht 61.0 in | Wt 127.0 lb

## 2021-10-23 DIAGNOSIS — I493 Ventricular premature depolarization: Secondary | ICD-10-CM | POA: Diagnosis not present

## 2021-10-23 DIAGNOSIS — E119 Type 2 diabetes mellitus without complications: Secondary | ICD-10-CM | POA: Insufficient documentation

## 2021-10-23 DIAGNOSIS — D6869 Other thrombophilia: Secondary | ICD-10-CM | POA: Diagnosis not present

## 2021-10-23 DIAGNOSIS — Z951 Presence of aortocoronary bypass graft: Secondary | ICD-10-CM | POA: Insufficient documentation

## 2021-10-23 DIAGNOSIS — I110 Hypertensive heart disease with heart failure: Secondary | ICD-10-CM | POA: Insufficient documentation

## 2021-10-23 DIAGNOSIS — R9431 Abnormal electrocardiogram [ECG] [EKG]: Secondary | ICD-10-CM | POA: Insufficient documentation

## 2021-10-23 DIAGNOSIS — Z950 Presence of cardiac pacemaker: Secondary | ICD-10-CM | POA: Diagnosis not present

## 2021-10-23 DIAGNOSIS — Z7901 Long term (current) use of anticoagulants: Secondary | ICD-10-CM | POA: Insufficient documentation

## 2021-10-23 DIAGNOSIS — I447 Left bundle-branch block, unspecified: Secondary | ICD-10-CM | POA: Insufficient documentation

## 2021-10-23 DIAGNOSIS — I2510 Atherosclerotic heart disease of native coronary artery without angina pectoris: Secondary | ICD-10-CM | POA: Diagnosis not present

## 2021-10-23 DIAGNOSIS — Z79899 Other long term (current) drug therapy: Secondary | ICD-10-CM | POA: Insufficient documentation

## 2021-10-23 DIAGNOSIS — I4819 Other persistent atrial fibrillation: Secondary | ICD-10-CM | POA: Diagnosis not present

## 2021-10-23 DIAGNOSIS — I5022 Chronic systolic (congestive) heart failure: Secondary | ICD-10-CM | POA: Diagnosis not present

## 2021-10-23 LAB — BASIC METABOLIC PANEL
Anion gap: 11 (ref 5–15)
BUN: 8 mg/dL (ref 8–23)
CO2: 24 mmol/L (ref 22–32)
Calcium: 9.1 mg/dL (ref 8.9–10.3)
Chloride: 97 mmol/L — ABNORMAL LOW (ref 98–111)
Creatinine, Ser: 0.59 mg/dL (ref 0.44–1.00)
GFR, Estimated: >60 mL/min (ref >60)
Glucose, Bld: 230 mg/dL — ABNORMAL HIGH (ref 70–99)
Potassium: 4.4 mmol/L (ref 3.5–5.1)
Sodium: 132 mmol/L — ABNORMAL LOW (ref 135–145)

## 2021-10-23 LAB — CBC
HCT: 35.6 % — ABNORMAL LOW (ref 36.0–46.0)
Hemoglobin: 11.6 g/dL — ABNORMAL LOW (ref 12.0–15.0)
MCH: 31.9 pg (ref 26.0–34.0)
MCHC: 32.6 g/dL (ref 30.0–36.0)
MCV: 97.8 fL (ref 80.0–100.0)
Platelets: 109 10*3/uL — ABNORMAL LOW (ref 150–400)
RBC: 3.64 MIL/uL — ABNORMAL LOW (ref 3.87–5.11)
RDW: 14.3 % (ref 11.5–15.5)
WBC: 4.6 10*3/uL (ref 4.0–10.5)
nRBC: 0 % (ref 0.0–0.2)

## 2021-10-23 MED ORDER — APIXABAN 2.5 MG PO TABS: 2.5000 mg | ORAL_TABLET | Freq: Two times a day (BID) | ORAL | 3 refills | Status: AC

## 2021-10-23 MED ORDER — CARVEDILOL 6.25 MG PO TABS: 6.2500 mg | ORAL_TABLET | Freq: Two times a day (BID) | ORAL | 3 refills | Status: DC

## 2021-10-23 NOTE — Patient Instructions (Signed)
Increase coreg to 6.'25mg'$  twice a day ? ?Increase Eliquis 2.'5mg'$  twice a day ?

## 2021-10-23 NOTE — Progress Notes (Signed)
? ? ?Primary Care Physician: Reynold Bowen, MD ?Primary Cardiologist: Dr Acie Fredrickson ?Primary Electrophysiologist: Dr Lovena Le  ?Referring Physician: Device clinic/Dr Lovena Le ? ? ?Cynthia [name] is a 88 y.o. female with a history of CAD s/p CABG, chronic systolic CHF, HTN, DM, atrial fibrillation who presents for consultation in the [facility].  The patient was initially diagnosed with atrial fibrillation 07/31/21 after presenting to the device clinic for a wound check s/p BiV placement. Not started on anticoagulation at that time. Remote device transmission 10/22/21 showed 18.6% afib burden with a persistent episode starting 10/18/21. Patient has a CHADS2VASC score of 7. She does have symptoms of fatigue with exertion and palpitations. She denies significant snoring or alcohol use.  ? ?Today, she denies symptoms of chest pain, shortness of breath, orthopnea, PND, lower extremity edema, dizziness, presyncope, syncope, snoring, daytime somnolence, bleeding, or neurologic sequela. The patient is tolerating medications without difficulties and is otherwise without complaint today.  ? ? ?Atrial Fibrillation Risk Factors: ? ?she does not have symptoms or diagnosis of sleep apnea. ?she does not have a history of rheumatic fever. ?she does not have a history of alcohol use. ?The patient does not have a history of early familial atrial fibrillation or other arrhythmias. ? ?she has a BMI of Body mass index is 24 kg/m?Marland KitchenMarland Kitchen ?Filed Weights  ? 10/23/21 1139  ?Weight: 57.6 kg  ? ? ?Family History  ?Problem Relation Age of Onset  ? Heart attack Mother   ? Stroke Father   ? Cancer Father   ? ? ? ?Atrial Fibrillation Management history: ? ?Previous antiarrhythmic drugs: none ?Previous cardioversions: none ?Previous ablations: none ?CHADS2VASC score: 7 ?Anticoagulation history: none ? ? ?Past Medical History:  ?Diagnosis Date  ? Anxiety   ? Arthritis   ? Carotid bruit   ? LEFT  ? Coronary artery disease   ? STATUS  POST PCI AND LATER CABG  ? Diabetes mellitus   ? Dyslipidemia   ? Dysrhythmia   ? hx of palpitations and PVC's   ? History of chicken pox   ? History of echocardiogram   ? Echo 09/21/22: mild LVH, EF 55-60, no RWMA, Gr 1 DD, MAC, mild MR, mild LAE, mild TR, PASP 31  ? Hypertension   ? Measles   ? hx of   ? Mumps   ? hx of   ? Myocardial infarction [facility])   ? PAF (paroxysmal atrial fibrillation) ([place]) 10/22/2021  ? Palpitations   ? PVC's (premature ventricular contractions)   ? ?Past Surgical History:  ?Procedure Laterality Date  ? BIV PACEMAKER INSERTION CRT-P N/A 07/21/2021  ? Procedure: BIV PACEMAKER INSERTION CRT-P;  Surgeon: Evans Lance, MD;  Location: [place] [facility];  Service: Cardiovascular;  Laterality: N/A;  ? CARDIAC CATHETERIZATION  06/06/97  ? IT REVEALA MILD TO MODERATE LEFT VENTRICULAR SYSTOLIC DYSFUNCTION  WITH EF OF 40%. THERE IS PERSISTENT AKINESIS OF THE INFERIOR WALL. THERE IS MILD REGURGITATION PRESENT. THERE IS MILD HYPOKINESIS OF THE ANTERIOR  AND APEX WALL  ? CARDIAC CATHETERIZATION N/A 07/27/2016  ? Procedure: Left Heart Cath and Cors/Grafts Angiography;  Surgeon: Nelva Bush, MD;  Location: [place] [facility];  Service: Cardiovascular;  Laterality: N/A;  ? CORONARY ARTERY BYPASS GRAFT    ? TONSILLECTOMY    ? TOTAL HIP ARTHROPLASTY Right 03/12/2015  ? Procedure: RIGHT TOTAL HIP ARTHROPLASTY ANTERIOR APPROACH;  Surgeon: Paralee Cancel, MD;  Location: [facility];  Service: Orthopedics;  Laterality: Right;  ? ? ?Current  Outpatient Medications  ?Medication Sig Dispense Refill  ? amoxicillin (AMOXIL) 500 MG capsule Take 2,000 mg by mouth See admin instructions. Take 2000 mg by mouth prior to dental procedures    ? aspirin EC 81 MG tablet Take 81 mg by mouth daily.    ? atorvastatin (LIPITOR) 40 MG tablet TAKE 1 TABLET DAILY 90 tablet 3  ? B-D INS SYR ULTRAFINE 1CC/31G 31G X 5/16" 1 ML MISC     ? BD PEN NEEDLE NANO U/F 32G X 4 MM MISC Inject 1 each into the skin as directed. With insulin  6  ?  calcium-vitamin D (OSCAL-500) 500-400 MG-UNIT tablet Take 1 tablet by mouth 2 (two) times daily.     ? carvedilol (COREG) 3.125 MG tablet Take 1 tablet (3.125 mg total) by mouth 2 (two) times daily. 180 tablet 3  ? Coenzyme Q10 (COQ10) 200 MG CAPS Take 200 mg by mouth every evening.    ? ezetimibe (ZETIA) 10 MG tablet TAKE 1 TABLET DAILY 90 tablet 3  ? insulin aspart (NOVOLOG FLEXPEN) 100 UNIT/ML FlexPen Inject 6 Units into the skin 3 (three) times daily with meals.    ? insulin degludec (TRESIBA FLEXTOUCH) 100 UNIT/ML FlexTouch Pen Inject 11 Units into the skin daily.    ? isosorbide mononitrate (IMDUR) 60 MG 24 hr tablet TAKE 1 TABLET BY MOUTH EVERY DAY 90 tablet 3  ? Multiple Vitamin (MULTIVITAMIN) tablet Take 1 tablet by mouth every morning.     ? ONE TOUCH ULTRA TEST test strip 2 (two) times daily. test blood sugar  5  ? ONETOUCH DELICA LANCETS 91Y MISC CHECK BLOOD SUGARS 2 TIMES A DAY  5  ? Polyethyl Glycol-Propyl Glycol (SYSTANE OP) Apply 1 drop to eye 2 (two) times daily as needed (dry/scratchy eyes).    ? sacubitril-valsartan (ENTRESTO) 24-26 MG Take 1 tablet by mouth 2 (two) times daily. 60 tablet 11  ? sitaGLIPtin-metformin (JANUMET) 50-1000 MG tablet Take 1 tablet by mouth 2 (two) times daily with a meal.    ? vitamin C (ASCORBIC ACID) 500 MG tablet Take 500 mg by mouth every evening.     ? nitroGLYCERIN (NITROSTAT) 0.4 MG SL tablet Place 1 tablet (0.4 mg total) under the tongue every 5 (five) minutes as needed for chest pain. 25 tablet 3  ? ?No current facility-administered medications for this encounter.  ? ? ?No Known Allergies ? ?Social History  ? ?Socioeconomic History  ? Marital status: Widowed  ?  Spouse name: Not on file  ? Number of children: Not on file  ? Years of education: Not on file  ? Highest education level: Not on file  ?Occupational History  ? Not on file  ?Tobacco Use  ? Smoking status: Never  ? Smokeless tobacco: Never  ? Tobacco comments:  ?  Never smoke 10/23/21  ?Vaping Use  ?  Vaping Use: Never used  ?Substance and Sexual Activity  ? Alcohol use: No  ? Drug use: No  ? Sexual activity: Not on file  ?Other Topics Concern  ? Not on file  ?Social History Narrative  ? Not on file  ? ?Social Determinants of Health  ? ?Financial Resource Strain: Not on file  ?Food Insecurity: Not on file  ?Transportation Needs: Not on file  ?Physical Activity: Not on file  ?Stress: Not on file  ?Social Connections: Not on file  ?Intimate Partner Violence: Not on file  ? ? ? ?ROS- All systems are reviewed and negative except as per  the HPI above. ? ?Physical Exam: ?Vitals:  ? 10/23/21 1139  ?BP: (!) 156/92  ?Pulse: 93  ?Weight: 57.6 kg  ?Height: _0  (1.549 m)  ? ? ?GEN- The patient is a well appearing elderly female, alert and oriented x 3 today.   ?Head- normocephalic, atraumatic ?Eyes-  Sclera clear, conjunctiva pink ?Ears- hearing intact ?Oropharynx- clear ?Neck- supple  ?Lungs- Clear to ausculation bilaterally, normal work of breathing ?Heart- irregular rate and rhythm, no murmurs, rubs or gallops  ?GI- soft, NT, ND, + BS ?Extremities- no clubbing, cyanosis, or edema ?MS- no significant deformity or atrophy ?Skin- no rash or lesion ?Psych- euthymic mood, full affect ?Neuro- strength and sensation are intact ? ?Wt Readings from Last 3 Encounters:  ?10/23/21 57.6 kg  ?07/21/21 56.2 kg  ?07/08/21 56.7 kg  ? ? ?EKG today demonstrates  ?Afib, LBBB, occasional V pacing ?Vent. rate 93 BPM ?PR interval * ms ?QRS duration 124 ms ?QT/QTcB 418/519 ms ? ? ?Echo 06/17/21 demonstrated  ? 1. Global hypokinesis with inferolateral and apical akinesis; overall  ?severe LV dysfunction.  ? 2. Left ventricular ejection fraction, by estimation, is 25 to 30%. The  ?left ventricle has severely decreased function. The left ventricle  ?demonstrates global hypokinesis. Left ventricular diastolic parameters are consistent with Grade II diastolic dysfunction (pseudonormalization). Elevated left atrial pressure.  ? 3. Right ventricular  systolic function is normal. The right ventricular  ?size is normal.  ? 4. Left atrial size was severely dilated.  ? 5. The mitral valve is normal in structure. Moderate mitral valve  ?regurgitation. No evidenc

## 2021-11-04 ENCOUNTER — Ambulatory Visit (INDEPENDENT_AMBULATORY_CARE_PROVIDER_SITE_OTHER): Payer: Medicare Other | Admitting: Internal Medicine

## 2021-11-04 ENCOUNTER — Encounter: Payer: Self-pay | Admitting: Internal Medicine

## 2021-11-04 ENCOUNTER — Other Ambulatory Visit: Payer: Self-pay

## 2021-11-04 VITALS — BP 124/68 | HR 98 | Ht 62.0 in | Wt 130.0 lb

## 2021-11-04 DIAGNOSIS — I447 Left bundle-branch block, unspecified: Secondary | ICD-10-CM | POA: Diagnosis not present

## 2021-11-04 DIAGNOSIS — I5022 Chronic systolic (congestive) heart failure: Secondary | ICD-10-CM | POA: Diagnosis not present

## 2021-11-04 DIAGNOSIS — Z950 Presence of cardiac pacemaker: Secondary | ICD-10-CM | POA: Diagnosis not present

## 2021-11-04 MED ORDER — AMIODARONE HCL 200 MG PO TABS: 200.0000 mg | ORAL_TABLET | Freq: Every day | ORAL | 3 refills | Status: DC

## 2021-11-04 NOTE — Progress Notes (Signed)
? ? ? ? ?HPI ?Cynthia [name] returns today after undergoing biv PM insertion. She is apleasant 88 yo woman with a h/o CAD, s/p MI, s/p PCI, s/p CABG and LBBB who developed worsening CHF symptoms and underwent biv PPM insertion 3 months ago. She did well initially but about a month ago developed atrial fib with a RVR. Since then she has developed worsening CHF symptoms. She does not have palpitations and could not tell that she was in atrial fib..  ? ?No Known Allergies ? ? ?Current Outpatient Medications  ?Medication Sig Dispense Refill  ? amoxicillin (AMOXIL) 500 MG capsule Take 2,000 mg by mouth See admin instructions. Take 2000 mg by mouth prior to dental procedures    ? apixaban (ELIQUIS) 2.5 MG TABS tablet Take 1 tablet (2.5 mg total) by mouth 2 (two) times daily. 60 tablet 3  ? aspirin EC 81 MG tablet Take 81 mg by mouth daily.    ? atorvastatin (LIPITOR) 40 MG tablet TAKE 1 TABLET DAILY 90 tablet 3  ? B-D INS SYR ULTRAFINE 1CC/31G 31G X 5/16" 1 ML MISC     ? BD PEN NEEDLE NANO U/F 32G X 4 MM MISC Inject 1 each into the skin as directed. With insulin  6  ? calcium-vitamin D (OSCAL-500) 500-400 MG-UNIT tablet Take 1 tablet by mouth 2 (two) times daily.     ? carvedilol (COREG) 6.25 MG tablet Take 1 tablet (6.25 mg total) by mouth 2 (two) times daily. 60 tablet 3  ? Coenzyme Q10 (COQ10) 200 MG CAPS Take 200 mg by mouth every evening.    ? ezetimibe (ZETIA) 10 MG tablet TAKE 1 TABLET DAILY 90 tablet 3  ? insulin aspart (NOVOLOG FLEXPEN) 100 UNIT/ML FlexPen Inject 6 Units into the skin 3 (three) times daily with meals.    ? insulin degludec (TRESIBA FLEXTOUCH) 100 UNIT/ML FlexTouch Pen Inject 11 Units into the skin daily.    ? isosorbide mononitrate (IMDUR) 60 MG 24 hr tablet TAKE 1 TABLET BY MOUTH EVERY DAY 90 tablet 3  ? Multiple Vitamin (MULTIVITAMIN) tablet Take 1 tablet by mouth every morning.     ? ONE TOUCH ULTRA TEST test strip 2 (two) times daily. test blood sugar  5  ? ONETOUCH DELICA LANCETS 81E MISC  CHECK BLOOD SUGARS 2 TIMES A DAY  5  ? Polyethyl Glycol-Propyl Glycol (SYSTANE OP) Apply 1 drop to eye 2 (two) times daily as needed (dry/scratchy eyes).    ? sacubitril-valsartan (ENTRESTO) 24-26 MG Take 1 tablet by mouth 2 (two) times daily. 60 tablet 11  ? sitaGLIPtin-metformin (JANUMET) 50-1000 MG tablet Take 1 tablet by mouth 2 (two) times daily with a meal.    ? vitamin C (ASCORBIC ACID) 500 MG tablet Take 500 mg by mouth every evening.     ? nitroGLYCERIN (NITROSTAT) 0.4 MG SL tablet Place 1 tablet (0.4 mg total) under the tongue every 5 (five) minutes as needed for chest pain. 25 tablet 3  ? ?No current facility-administered medications for this visit.  ? ? ? ?Past Medical History:  ?Diagnosis Date  ? Anxiety   ? Arthritis   ? Carotid bruit   ? LEFT  ? Coronary artery disease   ? STATUS POST PCI AND LATER CABG  ? Diabetes mellitus   ? Dyslipidemia   ? Dysrhythmia   ? hx of palpitations and PVC's   ? History of chicken pox   ? History of echocardiogram   ? Echo 2022/09/06: mild LVH, EF  55-60, no RWMA, Gr 1 DD, MAC, mild MR, mild LAE, mild TR, PASP 31  ? Hypertension   ? Measles   ? hx of   ? Mumps   ? hx of   ? Myocardial infarction [facility])   ? PAF (paroxysmal atrial fibrillation) ([place]) 10/22/2021  ? Palpitations   ? PVC's (premature ventricular contractions)   ? ? ?ROS: ? ? All systems reviewed and negative except as noted in the HPI. ? ? ?Past Surgical History:  ?Procedure Laterality Date  ? BIV PACEMAKER INSERTION CRT-P N/A 07/21/2021  ? Procedure: BIV PACEMAKER INSERTION CRT-P;  Surgeon: Evans Lance, MD;  Location: [place] [facility];  Service: Cardiovascular;  Laterality: N/A;  ? CARDIAC CATHETERIZATION  06/06/97  ? IT REVEALA MILD TO MODERATE LEFT VENTRICULAR SYSTOLIC DYSFUNCTION  WITH EF OF 40%. THERE IS PERSISTENT AKINESIS OF THE INFERIOR WALL. THERE IS MILD REGURGITATION PRESENT. THERE IS MILD HYPOKINESIS OF THE ANTERIOR  AND APEX WALL  ? CARDIAC CATHETERIZATION N/A 07/27/2016  ? Procedure: Left Heart  Cath and Cors/Grafts Angiography;  Surgeon: Nelva Bush, MD;  Location: [place] [facility];  Service: Cardiovascular;  Laterality: N/A;  ? CORONARY ARTERY BYPASS GRAFT    ? TONSILLECTOMY    ? TOTAL HIP ARTHROPLASTY Right 03/12/2015  ? Procedure: RIGHT TOTAL HIP ARTHROPLASTY ANTERIOR APPROACH;  Surgeon: Paralee Cancel, MD;  Location: [facility];  Service: Orthopedics;  Laterality: Right;  ? ? ? ?Family History  ?Problem Relation Age of Onset  ? Heart attack Mother   ? Stroke Father   ? Cancer Father   ? ? ? ?Social History  ? ?Socioeconomic History  ? Marital status: Widowed  ?  Spouse name: Not on file  ? Number of children: Not on file  ? Years of education: Not on file  ? Highest education level: Not on file  ?Occupational History  ? Not on file  ?Tobacco Use  ? Smoking status: Never  ? Smokeless tobacco: Never  ? Tobacco comments:  ?  Never smoke 10/23/21  ?Vaping Use  ? Vaping Use: Never used  ?Substance and Sexual Activity  ? Alcohol use: No  ? Drug use: No  ? Sexual activity: Not on file  ?Other Topics Concern  ? Not on file  ?Social History Narrative  ? Not on file  ? ?Social Determinants of Health  ? ?Financial Resource Strain: Not on file  ?Food Insecurity: Not on file  ?Transportation Needs: Not on file  ?Physical Activity: Not on file  ?Stress: Not on file  ?Social Connections: Not on file  ?Intimate Partner Violence: Not on file  ? ? ? ?BP 124/68   Pulse 98   Ht _0  (1.575 m)   Wt 130 lb (59 kg)   SpO2 99%   BMI 23.78 kg/m?  ? ?Physical Exam: ? ?Well appearing NAD ?HEENT: Unremarkable ?Neck:  No JVD, no thyromegally ?Lymphatics:  No adenopathy ?Back:  No CVA tenderness ?Lungs:  Clear with no wheezes ?HEART:  Regular rate rhythm, no murmurs, no rubs, no clicks ?Abd:  soft, positive bowel sounds, no organomegally, no rebound, no guarding ?Ext:  2 plus pulses, no edema, no cyanosis, no clubbing ?Skin:  No rashes no nodules ?Neuro:  CN II through XII intact, motor grossly intact ? ?EKG - atrial fib with  intermittent pacing ? ?DEVICE  ?Normal device function.  See PaceArt for details.  ? ?Assess/Plan:  ? ?Chronic systolic heart failure - she is s/p biv PPM insertion. In  the interim she notes improvement then worsening which correlates to the development of atrial fib.  ?CAD - she denies anginal symptoms.  ?HTN -her bp is better after uptitration of her medical therapy.  ?Atrial fib- this is a new problem and coincides with her symptoms worsening. I have reviewed the options and recommended amiodarone at 200 mg daily. I'll see her back in 3 months. Hopefully she will revert back to NSR and feel better. ?PPM - her medtronic biv PPM is working normally. ? ?Cynthia [name] ?  ?Cynthia Overlie Jamerson Vonbargen,MD ?

## 2021-11-04 NOTE — Patient Instructions (Addendum)
Medication Instructions:  ?Your physician has recommended you make the following change in your medication:  ? ? START taking amiodarone 200 mg-  Take one tablet by mouth daily ? ?Labwork: ?None ordered. ? ?Testing/Procedures: ?None ordered. ? ?Follow-Up: ?Your physician wants you to follow-up in: 3 months with Cristopher Peru, MD  ? ?Remote monitoring is used to monitor your Pacemaker from home. This monitoring reduces the number of office visits required to check your device to one time per year. It allows [place] to keep an eye on the functioning of your device to ensure it is working properly. You are scheduled for a device check from home on 01/20/2022. You may send your transmission at any time that day. If you have a wireless device, the transmission will be sent automatically. After your physician reviews your transmission, you will receive a postcard with your next transmission date. ? ?Amiodarone Tablets ?What is this medication? ?AMIODARONE (a MEE oh da rone) prevents and treats a fast or irregular heartbeat (arrhythmia). It works by slowing down overactive electric signals in the heart, which stabilizes your heart rhythm. It belongs to a group of medications called antiarrhythmics. ?This medicine may be used for other purposes; ask your health care provider or pharmacist if you have questions. ?COMMON BRAND NAME(S): Cordarone, Pacerone ?What should I tell my care team before I take this medication? ?They need to know if you have any of these conditions: ?Liver disease ?Lung disease ?Other heart problems ?Thyroid disease ?An unusual or allergic reaction to amiodarone, iodine, other medications, foods, dyes, or preservatives ?Pregnant or trying to get pregnant ?Breast-feeding ?How should I use this medication? ?Take this medication by mouth with a glass of water. Follow the directions on the prescription label. You can take this medication with or without food. However, you should always take it the same way each  time. Take your doses at regular intervals. Do not take your medication more often than directed. Do not stop taking except on the advice of your care team. ?A special MedGuide will be given to you by the pharmacist with each prescription and refill. Be sure to read this information carefully each time. ?Talk to your care team regarding the use of this medication in children. Special care may be needed. ?Overdosage: If you think you have taken too much of this medicine contact a poison control center or emergency room at once. ?NOTE: This medicine is only for you. Do not share this medicine with others. ?What if I miss a dose? ?If you miss a dose, take it as soon as you can. If it is almost time for your next dose, take only that dose. Do not take double or extra doses. ?What may interact with this medication? ?Do not take this medication with any of the following: ?Abarelix ?Apomorphine ?Arsenic trioxide ?Certain antibiotics like erythromycin, gemifloxacin, levofloxacin, pentamidine ?Certain medications for depression like amoxapine, tricyclic antidepressants ?Certain medications for fungal infections like fluconazole, itraconazole, ketoconazole, posaconazole, voriconazole ?Certain medications for irregular heartbeat like disopyramide, dronedarone, ibutilide, propafenone, sotalol ?Certain medications for malaria like chloroquine, halofantrine ?Cisapride ?Droperidol ?Haloperidol ?[place] ?Maprotiline ?Methadone ?Phenothiazines like chlorpromazine, mesoridazine, thioridazine ?Pimozide ?Ranolazine ?Red yeast rice ?Vardenafil ?This medication may also interact with the following: ?Antiviral medications for HIV or AIDS ?Certain medications for blood pressure, heart disease, irregular heart beat ?Certain medications for cholesterol like atorvastatin, cerivastatin, lovastatin, simvastatin ?Certain medications for hepatitis C like sofosbuvir and ledipasvir; sofosbuvir ?Certain medications for seizures like  phenytoin ?Certain medications for thyroid problems ?Certain  medications that treat or prevent blood clots like warfarin ?Cholestyramine ?Cimetidine ?Clopidogrel ?Cyclosporine ?Dextromethorphan ?Diuretics ?Dofetilide ?Fentanyl ?General anesthetics ?Grapefruit juice ?Lidocaine ?Loratadine ?Methotrexate ?Other medications that prolong the QT interval (cause an abnormal heart rhythm) ?Procainamide ?Quinidine ?Rifabutin, rifampin, or rifapentine ?[place] ?Trazodone ?Ziprasidone ?This list may not describe all possible interactions. Give your health care provider a list of all the medicines, herbs, non-prescription drugs, or dietary supplements you use. Also tell them if you smoke, drink alcohol, or use illegal drugs. Some items may interact with your medicine. ?What should I watch for while using this medication? ?Your condition will be monitored closely when you first begin therapy. Often, this medication is first started in a hospital or other monitored health care setting. Once you are on maintenance therapy, visit your care team for regular checks on your progress. Because your condition and use of this medication carry some risk, it is a good idea to carry an identification card, necklace or bracelet with details of your condition, medications, and care team. ?You may get drowsy or dizzy. Do not drive, use machinery, or do anything that needs mental alertness until you know how this medication affects you. Do not stand or sit up quickly, especially if you are an older patient. This reduces the risk of dizzy or fainting spells. ?This medication can make you more sensitive to the sun. Keep out of the sun. If you cannot avoid being in the sun, wear protective clothing and use sunscreen. Do not use sun lamps or tanning beds/booths. ?You should have regular eye exams before and during treatment. Call your care team if you have blurred vision, see halos, or your eyes become sensitive to light. Your eyes may get  dry. It may be helpful to use a lubricating eye solution or artificial tears solution. ?If you are going to have surgery or a procedure that requires contrast dyes, tell your care team that you are taking this medication. ?What side effects may I notice from receiving this medication? ?Side effects that you should report to your care team as soon as possible: ?Allergic reactions--skin rash, itching, hives, swelling of the face, lips, tongue, or throat ?Bluish-gray skin ?Change in vision such as blurry vision, seeing halos around lights, vision loss ?Heart failure--shortness of breath, swelling of the ankles, feet, or hands, sudden weight gain, unusual weakness or fatigue ?Heart rhythm changes--fast or irregular heartbeat, dizziness, feeling faint or lightheaded, chest pain, trouble breathing ?High thyroid levels (hyperthyroidism)--fast or irregular heartbeat, weight loss, excessive sweating or sensitivity to heat, tremors or shaking, anxiety, nervousness, irregular menstrual cycle or spotting ?Liver injury--right upper belly pain, loss of appetite, nausea, light-colored stool, dark yellow or brown urine, yellowing skin or eyes, unusual weakness or fatigue ?Low thyroid levels (hypothyroidism)--unusual weakness or fatigue, sensitivity to cold, constipation, hair loss, dry skin, weight gain, feelings of depression ?Lung injury--shortness of breath or trouble breathing, cough, spitting up blood, chest pain, fever ?Pain, tingling, or numbness in the hands or feet, muscle weakness, trouble walking, loss of balance or coordination ?Side effects that usually do not require medical attention (report to your care team if they continue or are bothersome): ?Nausea ?Vomiting ?This list may not describe all possible side effects. Call your doctor for medical advice about side effects. You may report side effects to FDA at 1-800-FDA-1088. ?Where should I keep my medication? ?Keep out of the reach of children and pets. ?Store at  room temperature between 20 and 25 degrees C (68 and 77 degrees  F). Protect from light. Keep container tightly closed. Throw away any unused medication after the expiration date. ?NOTE: This sheet is a summary. It may n

## 2021-11-04 NOTE — Progress Notes (Signed)
Remote pacemaker transmission.   

## 2021-11-13 DIAGNOSIS — I10 Essential (primary) hypertension: Secondary | ICD-10-CM | POA: Diagnosis not present

## 2021-11-13 DIAGNOSIS — I5189 Other ill-defined heart diseases: Secondary | ICD-10-CM | POA: Diagnosis not present

## 2021-11-13 DIAGNOSIS — I2510 Atherosclerotic heart disease of native coronary artery without angina pectoris: Secondary | ICD-10-CM | POA: Diagnosis not present

## 2021-11-13 DIAGNOSIS — E1140 Type 2 diabetes mellitus with diabetic neuropathy, unspecified: Secondary | ICD-10-CM | POA: Diagnosis not present

## 2021-11-13 DIAGNOSIS — Z794 Long term (current) use of insulin: Secondary | ICD-10-CM | POA: Diagnosis not present

## 2021-11-13 DIAGNOSIS — I4891 Unspecified atrial fibrillation: Secondary | ICD-10-CM | POA: Diagnosis not present

## 2021-11-19 ENCOUNTER — Other Ambulatory Visit (HOSPITAL_COMMUNITY): Payer: Self-pay | Admitting: Physician Assistant

## 2021-11-21 ENCOUNTER — Other Ambulatory Visit: Payer: Self-pay | Admitting: Cardiovascular Disease

## 2021-11-26 LAB — CUP PACEART INCLINIC DEVICE CHECK
Battery Remaining Longevity: 106 mo
Battery Voltage: 3.13 V
Brady Statistic AP VP Percent: 18.13 %
Brady Statistic AP VS Percent: 0.03 %
Brady Statistic AS VP Percent: 81.48 %
Brady Statistic AS VS Percent: 0.31 %
Brady Statistic RA Percent Paced: 14.29 %
Brady Statistic RV Percent Paced: 91.12 %
Date Time Interrogation Session: 20230328141500
Implantable Lead Implant Date: 20221212
Implantable Lead Implant Date: 20221212
Implantable Lead Implant Date: 20221212
Implantable Lead Location: 753858
Implantable Lead Location: 753859
Implantable Lead Location: 753860
Implantable Lead Model: 3830
Implantable Lead Model: 4798
Implantable Lead Model: 5076
Implantable Pulse Generator Implant Date: 20221212
Lead Channel Impedance Value: 304 Ohm
Lead Channel Impedance Value: 304 Ohm
Lead Channel Impedance Value: 323 Ohm
Lead Channel Impedance Value: 323 Ohm
Lead Channel Impedance Value: 380 Ohm
Lead Channel Impedance Value: 399 Ohm
Lead Channel Impedance Value: 456 Ohm
Lead Channel Impedance Value: 494 Ohm
Lead Channel Impedance Value: 570 Ohm
Lead Channel Impedance Value: 570 Ohm
Lead Channel Impedance Value: 589 Ohm
Lead Channel Impedance Value: 589 Ohm
Lead Channel Impedance Value: 646 Ohm
Lead Channel Impedance Value: 665 Ohm
Lead Channel Pacing Threshold Amplitude: 0.5 V
Lead Channel Pacing Threshold Amplitude: 0.75 V
Lead Channel Pacing Threshold Amplitude: 1 V
Lead Channel Pacing Threshold Pulse Width: 0.4 ms
Lead Channel Pacing Threshold Pulse Width: 0.4 ms
Lead Channel Pacing Threshold Pulse Width: 0.6 ms
Lead Channel Sensing Intrinsic Amplitude: 0.375 mV
Lead Channel Sensing Intrinsic Amplitude: 0.625 mV
Lead Channel Sensing Intrinsic Amplitude: 22.5 mV
Lead Channel Sensing Intrinsic Amplitude: 25 mV
Lead Channel Setting Pacing Amplitude: 2.5 V
Lead Channel Setting Pacing Amplitude: 2.5 V
Lead Channel Setting Pacing Amplitude: 3.5 V
Lead Channel Setting Pacing Pulse Width: 0.4 ms
Lead Channel Setting Pacing Pulse Width: 0.6 ms
Lead Channel Setting Sensing Sensitivity: 0.9 mV

## 2021-11-28 ENCOUNTER — Telehealth: Payer: Self-pay | Admitting: Internal Medicine

## 2021-11-28 NOTE — Telephone Encounter (Signed)
Pt c/o medication issue: ? ?1. Name of Medication:  ?amiodarone (PACERONE) 200 MG tablet ? ?2. How are you currently taking this medication (dosage and times per day)?  ?As prescribed, 1 tablet by mouth daily at 6:00 PM ? ?3. Are you having a reaction (difficulty breathing--STAT)?  ? ?4. What is your medication issue?  ? ?Patient assuming this medication has been causing brain fog. She states it seems like it has been getting progressively worse and she is also having issues with balance. ? ?

## 2021-11-28 NOTE — Telephone Encounter (Signed)
Returned call to Pt. ? ?She has noticed some brain fog, seems worse in the morning and better by lunch time. ? ?Per Dr. Teofilo Pod if she can tolerate medication a few more weeks.  Scheduled f/u with RU on 12/12/21 to see if Pt in rhythm.  If not will need to be scheduled for DCCV. ? ?Pt states she will see if she can continue.  Will check in next week to see how she is doing. ?

## 2021-12-03 DIAGNOSIS — E119 Type 2 diabetes mellitus without complications: Secondary | ICD-10-CM | POA: Diagnosis not present

## 2021-12-03 DIAGNOSIS — Z961 Presence of intraocular lens: Secondary | ICD-10-CM | POA: Diagnosis not present

## 2021-12-03 DIAGNOSIS — H52203 Unspecified astigmatism, bilateral: Secondary | ICD-10-CM | POA: Diagnosis not present

## 2021-12-05 MED ORDER — METOPROLOL SUCCINATE ER 25 MG PO TB24
25.0000 mg | ORAL_TABLET | Freq: Every day | ORAL | 11 refills | Status: AC
Start: 2021-12-05 — End: ?

## 2021-12-05 NOTE — Telephone Encounter (Signed)
Outreach made to Pt. ? ?Pt states she continues to have periods of confusion, which are new to her after starting carvedilol and amiodarone. ? ?Discussed with AT.  Will try changing Pt from carvedilol to metoprolol succinate to see if this side effect improves. ? ?Sent 30 day prescription for Pt to try.  She has follow up with RU on 12/12/21. ? ?Pt thanked nurse for call.   ? ? ?

## 2021-12-11 NOTE — H&P (View-Only) (Signed)
? ?Cardiology Office Note ?Date:  12/12/2021  ?Patient ID:  Cynthia [name], Cynthia [name] 1932/06/29, MRN [id number] ?PCP:  Reynold Bowen, MD  ?Cardiologist: Dr. Acie Fredrickson ?Electrophysiologist: Dr. Lovena Le ? ? ?  ?Chief Complaint:  planned f/u ? ?History of Present Illness: ?Cynthia [name] is a 88 y.o. female with history of CAD ([place], CABG 1998, PCI 2019), DM, HTN, HLD, arthritis, LBBB, ICM, chronic CHF (combined), and Afib. ? ?Fund with Afib at her wound check visit > Afib clinic, 10/23/21 started on Eliquis and BB titrated ? ?She comes in today to be seen for Dr. Lovena Le, last seen by him 11/04/21,noted worsening HF symptoms though no palpitations or awareness of the AFib.  Felt her overall worsening of symptoms was AFib and started on amiodarone ? ?The patient called 11/28/21, c/o brain fog and some balance issues since starting the amiodarone, and given an appt to come in. ?Her coreg changed to metoprolol  ? ?TODAY ?She feels pretty lousy, weak, SOB with less exertion. ?With the change from coreg to Toprol she thinks her thinking has clear but her legs feel weak, feels like she is unsteady. ?No syncope. ?No bleeding ?She reports compliance with Eliquis, no missed doses ? ?Device information ?MDT CRT-P implanted 07/21/21 ? ?Afib/AAD hx ?Diagnosed via device at her wound check visit 07/31/21 ? ? ?Past Medical History:  ?Diagnosis Date  ? Anxiety   ? Arthritis   ? Carotid bruit   ? LEFT  ? Coronary artery disease   ? STATUS POST PCI AND LATER CABG  ? Diabetes mellitus   ? Dyslipidemia   ? Dysrhythmia   ? hx of palpitations and PVC's   ? History of chicken pox   ? History of echocardiogram   ? Echo August 28, 2022: mild LVH, EF 55-60, no RWMA, Gr 1 DD, MAC, mild MR, mild LAE, mild TR, PASP 31  ? Hypertension   ? Measles   ? hx of   ? Mumps   ? hx of   ? Myocardial infarction [facility])   ? PAF (paroxysmal atrial fibrillation) ([place]) 10/22/2021  ? Palpitations   ? PVC's (premature ventricular contractions)   ? ? ?Past Surgical History:   ?Procedure Laterality Date  ? BIV PACEMAKER INSERTION CRT-P N/A 07/21/2021  ? Procedure: BIV PACEMAKER INSERTION CRT-P;  Surgeon: Evans Lance, MD;  Location: [facility];  Service: Cardiovascular;  Laterality: N/A;  ? CARDIAC CATHETERIZATION  06/06/97  ? IT REVEALA MILD TO MODERATE LEFT VENTRICULAR SYSTOLIC DYSFUNCTION  WITH EF OF 40%. THERE IS PERSISTENT AKINESIS OF THE INFERIOR WALL. THERE IS MILD REGURGITATION PRESENT. THERE IS MILD HYPOKINESIS OF THE ANTERIOR  AND APEX WALL  ? CARDIAC CATHETERIZATION N/A 07/27/2016  ? Procedure: Left Heart Cath and Cors/Grafts Angiography;  Surgeon: Nelva Bush, MD;  Location: [facility];  Service: Cardiovascular;  Laterality: N/A;  ? CORONARY ARTERY BYPASS GRAFT    ? TONSILLECTOMY    ? TOTAL HIP ARTHROPLASTY Right 03/12/2015  ? Procedure: RIGHT TOTAL HIP ARTHROPLASTY ANTERIOR APPROACH;  Surgeon: Paralee Cancel, MD;  Location: [facility];  Service: Orthopedics;  Laterality: Right;  ? ? ?Current Outpatient Medications  ?Medication Sig Dispense Refill  ? amiodarone (PACERONE) 200 MG tablet Take 1 tablet (200 mg total) by mouth daily. 90 tablet 3  ? amoxicillin (AMOXIL) 500 MG capsule Take 2,000 mg by mouth See admin instructions. Take 2000 mg by mouth prior to dental procedures    ? apixaban (ELIQUIS) 2.5 MG TABS tablet Take 1 tablet (  2.5 mg total) by mouth 2 (two) times daily. 60 tablet 3  ? aspirin EC 81 MG tablet Take 81 mg by mouth daily.    ? atorvastatin (LIPITOR) 40 MG tablet TAKE 1 TABLET DAILY 90 tablet 3  ? B-D INS SYR ULTRAFINE 1CC/31G 31G X 5/16" 1 ML MISC     ? BD PEN NEEDLE NANO U/F 32G X 4 MM MISC Inject 1 each into the skin as directed. With insulin  6  ? calcium-vitamin D (OSCAL-500) 500-400 MG-UNIT tablet Take 1 tablet by mouth 2 (two) times daily.     ? Coenzyme Q10 (COQ10) 200 MG CAPS Take 200 mg by mouth every evening.    ? ezetimibe (ZETIA) 10 MG tablet TAKE 1 TABLET DAILY 90 tablet 3  ? insulin aspart (NOVOLOG FLEXPEN) 100 UNIT/ML FlexPen  Inject 6 Units into the skin 3 (three) times daily with meals.    ? insulin degludec (TRESIBA FLEXTOUCH) 100 UNIT/ML FlexTouch Pen Inject 11 Units into the skin daily.    ? isosorbide mononitrate (IMDUR) 60 MG 24 hr tablet TAKE 1 TABLET BY MOUTH EVERY DAY 90 tablet 1  ? metoprolol succinate (TOPROL XL) 25 MG 24 hr tablet Take 1 tablet (25 mg total) by mouth daily. 30 tablet 11  ? Multiple Vitamin (MULTIVITAMIN) tablet Take 1 tablet by mouth every morning.     ? nitroGLYCERIN (NITROSTAT) 0.4 MG SL tablet Place 1 tablet (0.4 mg total) under the tongue every 5 (five) minutes as needed for chest pain. 25 tablet 3  ? ONE TOUCH ULTRA TEST test strip 2 (two) times daily. test blood sugar  5  ? ONETOUCH DELICA LANCETS 05L MISC CHECK BLOOD SUGARS 2 TIMES A DAY  5  ? Polyethyl Glycol-Propyl Glycol (SYSTANE OP) Apply 1 drop to eye 2 (two) times daily as needed (dry/scratchy eyes).    ? sacubitril-valsartan (ENTRESTO) 24-26 MG Take 1 tablet by mouth 2 (two) times daily. 60 tablet 11  ? sitaGLIPtin-metformin (JANUMET) 50-1000 MG tablet Take 1 tablet by mouth 2 (two) times daily with a meal.    ? vitamin C (ASCORBIC ACID) 500 MG tablet Take 500 mg by mouth every evening.     ? ?No current facility-administered medications for this visit.  ? ? ?Allergies:   Patient has no known allergies.  ? ?Social History:  The patient  reports that she has never smoked. She has never used smokeless tobacco. She reports that she does not drink alcohol and does not use drugs.  ? ?Family History:  The patient's family history includes Cancer in her father; Heart attack in her mother; Stroke in her father. ? ?ROS:  Please see the history of present illness.    ?All other systems are reviewed and otherwise negative.  ? ?PHYSICAL EXAM:  ?VS:  BP (!) 142/80   Pulse 74   Ht _0  (1.575 m)   Wt 124 lb 9.6 oz (56.5 kg)   SpO2 98%   BMI 22.79 kg/m?  BMI: Body mass index is 22.79 kg/m?. ?Well nourished, well developed, in no acute distress ?HEENT:  normocephalic, atraumatic ?Neck: no JVD, carotid bruits or masses ?Cardiac:  irreg-irreg; no significant murmurs, no rubs, or gallops ?Lungs:  CTA b/l, no wheezing, rhonchi or rales ?Abd: soft, nontender ?MS: no deformity, age appropriate atrophy ?Ext: trace-1+ LLE to mid shin, trace on the right ?Skin: warm and dry, no rash ?Neuro:  No gross deficits appreciated ?Psych: euthymic mood, full affect ? ?PPM site is stable, no  tethering or discomfort ? ? ?EKG:  done today and reviewed by myself  ?Afib 74bpm, some pacing both A (inappropriately) and V ? ?Device interrogation done today and reviewed by myself:  ?Battery and lead measurements are good ?She is in AFib ?Some undersensing in A, fib wave are 0.3, sensing changed to 0.15 (only option)  ?She has been in AFib since March ?Poor effective BP with only 69% VP 2/2 Afib ? ?06/17/2021: TTE ? 1. Global hypokinesis with inferolateral and apical akinesis; overall  ?severe LV dysfunction.  ? 2. Left ventricular ejection fraction, by estimation, is 25 to 30%. The  ?left ventricle has severely decreased function. The left ventricle  ?demonstrates global hypokinesis. Left ventricular diastolic parameters are  ?consistent with Grade II diastolic  ?dysfunction (pseudonormalization). Elevated left atrial pressure.  ? 3. Right ventricular systolic function is normal. The right ventricular  ?size is normal.  ? 4. Left atrial size was severely dilated.  ? 5. The mitral valve is normal in structure. Moderate mitral valve  ?regurgitation. No evidence of mitral stenosis. Moderate mitral annular  ?calcification.  ? 6. Tricuspid valve regurgitation is moderate.  ? 7. The aortic valve is tricuspid. Aortic valve regurgitation is trivial.  ?No aortic stenosis is present.  ? 8. The inferior vena cava is normal in size with greater than 50%  ?respiratory variability, suggesting right atrial pressure of 3 mmHg.  ? ? ?04/29/2021: stress myoview ?  Findings are consistent with prior myocardial  infarction. The study is high risk due to reduced systolic function and prior infarct. ?  No ST deviation was noted. ?  LV perfusion is abnormal. There is no evidence of ischemia. There is evidence of infarction. Defect

## 2021-12-11 NOTE — Progress Notes (Signed)
? ?Cardiology Office Note ?Date:  12/12/2021  ?Patient ID:  Cynthia, [name] Aug 15, 1931, MRN [id number] ?PCP:  Cynthia Bowen, MD  ?Cardiologist: Cynthia [name] ?Electrophysiologist: Cynthia [name] ? ? ?  ?Chief Complaint:  planned f/u ? ?History of Present Illness: ?Cynthia [name] is a 88 y.o. female with history of CAD ([place], CABG 1998, PCI 2019), DM, HTN, HLD, arthritis, LBBB, ICM, chronic CHF (combined), and Afib. ? ?Fund with Afib at her wound check visit > Afib clinic, 10/23/21 started on Eliquis and BB titrated ? ?She comes in today to be seen for Cynthia [name], last seen by him 11/04/21,noted worsening HF symptoms though no palpitations or awareness of the AFib.  Felt her overall worsening of symptoms was AFib and started on amiodarone ? ?The patient called 11/28/21, c/o brain fog and some balance issues since starting the amiodarone, and given an appt to come in. ?Her coreg changed to metoprolol  ? ?TODAY ?She feels pretty lousy, weak, SOB with less exertion. ?With the change from coreg to Toprol she thinks her thinking has clear but her legs feel weak, feels like she is unsteady. ?No syncope. ?No bleeding ?She reports compliance with Eliquis, no missed doses ? ?Device information ?MDT CRT-P implanted 07/21/21 ? ?Afib/AAD hx ?Diagnosed via device at her wound check visit 07/31/21 ? ? ?Past Medical History:  ?Diagnosis Date  ? Anxiety   ? Arthritis   ? Carotid bruit   ? LEFT  ? Coronary artery disease   ? STATUS POST PCI AND LATER CABG  ? Diabetes mellitus   ? Dyslipidemia   ? Dysrhythmia   ? hx of palpitations and PVC's   ? History of chicken pox   ? History of echocardiogram   ? Echo 2022/09/11: mild LVH, EF 55-60, no RWMA, Gr 1 DD, MAC, mild MR, mild LAE, mild TR, PASP 31  ? Hypertension   ? Measles   ? hx of   ? Mumps   ? hx of   ? Myocardial infarction [facility])   ? PAF (paroxysmal atrial fibrillation) ([place]) 10/22/2021  ? Palpitations   ? PVC's (premature ventricular contractions)   ? ? ?Past Surgical History:   ?Procedure Laterality Date  ? BIV PACEMAKER INSERTION CRT-P N/A 07/21/2021  ? Procedure: BIV PACEMAKER INSERTION CRT-P;  Surgeon: Cynthia Lance, MD;  Location: [facility];  Service: Cardiovascular;  Laterality: N/A;  ? CARDIAC CATHETERIZATION  06/06/97  ? IT REVEALA MILD TO MODERATE LEFT VENTRICULAR SYSTOLIC DYSFUNCTION  WITH EF OF 40%. THERE IS PERSISTENT AKINESIS OF THE INFERIOR WALL. THERE IS MILD REGURGITATION PRESENT. THERE IS MILD HYPOKINESIS OF THE ANTERIOR  AND APEX WALL  ? CARDIAC CATHETERIZATION N/A 07/27/2016  ? Procedure: Left Heart Cath and Cors/Grafts Angiography;  Surgeon: Cynthia Bush, MD;  Location: [facility];  Service: Cardiovascular;  Laterality: N/A;  ? CORONARY ARTERY BYPASS GRAFT    ? TONSILLECTOMY    ? TOTAL HIP ARTHROPLASTY Right 03/12/2015  ? Procedure: RIGHT TOTAL HIP ARTHROPLASTY ANTERIOR APPROACH;  Surgeon: Cynthia Cancel, MD;  Location: [facility];  Service: Orthopedics;  Laterality: Right;  ? ? ?Current Outpatient Medications  ?Medication Sig Dispense Refill  ? amiodarone (PACERONE) 200 MG tablet Take 1 tablet (200 mg total) by mouth daily. 90 tablet 3  ? amoxicillin (AMOXIL) 500 MG capsule Take 2,000 mg by mouth See admin instructions. Take 2000 mg by mouth prior to dental procedures    ? apixaban (ELIQUIS) 2.5 MG TABS tablet Take 1 tablet (  2.5 mg total) by mouth 2 (two) times daily. 60 tablet 3  ? aspirin EC 81 MG tablet Take 81 mg by mouth daily.    ? atorvastatin (LIPITOR) 40 MG tablet TAKE 1 TABLET DAILY 90 tablet 3  ? B-D INS SYR ULTRAFINE 1CC/31G 31G X 5/16" 1 ML MISC     ? BD PEN NEEDLE NANO U/F 32G X 4 MM MISC Inject 1 each into the skin as directed. With insulin  6  ? calcium-vitamin D (OSCAL-500) 500-400 MG-UNIT tablet Take 1 tablet by mouth 2 (two) times daily.     ? Coenzyme Q10 (COQ10) 200 MG CAPS Take 200 mg by mouth every evening.    ? ezetimibe (ZETIA) 10 MG tablet TAKE 1 TABLET DAILY 90 tablet 3  ? insulin aspart (NOVOLOG FLEXPEN) 100 UNIT/ML FlexPen  Inject 6 Units into the skin 3 (three) times daily with meals.    ? insulin degludec (TRESIBA FLEXTOUCH) 100 UNIT/ML FlexTouch Pen Inject 11 Units into the skin daily.    ? isosorbide mononitrate (IMDUR) 60 MG 24 hr tablet TAKE 1 TABLET BY MOUTH EVERY DAY 90 tablet 1  ? metoprolol succinate (TOPROL XL) 25 MG 24 hr tablet Take 1 tablet (25 mg total) by mouth daily. 30 tablet 11  ? Multiple Vitamin (MULTIVITAMIN) tablet Take 1 tablet by mouth every morning.     ? nitroGLYCERIN (NITROSTAT) 0.4 MG SL tablet Place 1 tablet (0.4 mg total) under the tongue every 5 (five) minutes as needed for chest pain. 25 tablet 3  ? ONE TOUCH ULTRA TEST test strip 2 (two) times daily. test blood sugar  5  ? ONETOUCH DELICA LANCETS 33G MISC CHECK BLOOD SUGARS 2 TIMES A DAY  5  ? Polyethyl Glycol-Propyl Glycol (SYSTANE OP) Apply 1 drop to eye 2 (two) times daily as needed (dry/scratchy eyes).    ? sacubitril-valsartan (ENTRESTO) 24-26 MG Take 1 tablet by mouth 2 (two) times daily. 60 tablet 11  ? sitaGLIPtin-metformin (JANUMET) 50-1000 MG tablet Take 1 tablet by mouth 2 (two) times daily with a meal.    ? vitamin C (ASCORBIC ACID) 500 MG tablet Take 500 mg by mouth every evening.     ? ?No current facility-administered medications for this visit.  ? ? ?Allergies:   Patient has no known allergies.  ? ?Social History:  The patient  reports that she has never smoked. She has never used smokeless tobacco. She reports that she does not drink alcohol and does not use drugs.  ? ?Family History:  The patient's family history includes Cancer in her father; Heart attack in her mother; Stroke in her father. ? ?ROS:  Please see the history of present illness.    ?All other systems are reviewed and otherwise negative.  ? ?PHYSICAL EXAM:  ?VS:  BP (!) 142/80   Pulse 74   Ht 5' 2" (1.575 m)   Wt 124 lb 9.6 oz (56.5 kg)   SpO2 98%   BMI 22.79 kg/m?  BMI: Body mass index is 22.79 kg/m?. ?Well nourished, well developed, in no acute distress ?HEENT:  normocephalic, atraumatic ?Neck: no JVD, carotid bruits or masses ?Cardiac:  irreg-irreg; no significant murmurs, no rubs, or gallops ?Lungs:  CTA b/l, no wheezing, rhonchi or rales ?Abd: soft, nontender ?MS: no deformity, age appropriate atrophy ?Ext: trace-1+ LLE to mid shin, trace on the right ?Skin: warm and dry, no rash ?Neuro:  No gross deficits appreciated ?Psych: euthymic mood, full affect ? ?PPM site is stable, no   tethering or discomfort ? ? ?EKG:  done today and reviewed by myself  ?Afib 74bpm, some pacing both A (inappropriately) and V ? ?Device interrogation done today and reviewed by myself:  ?Battery and lead measurements are good ?She is in AFib ?Some undersensing in A, fib wave are 0.3, sensing changed to 0.15 (only option)  ?She has been in AFib since March ?Poor effective BP with only 69% VP 2/2 Afib ? ?06/17/2021: TTE ? 1. Global hypokinesis with inferolateral and apical akinesis; overall  ?severe LV dysfunction.  ? 2. Left ventricular ejection fraction, by estimation, is 25 to 30%. The  ?left ventricle has severely decreased function. The left ventricle  ?demonstrates global hypokinesis. Left ventricular diastolic parameters are  ?consistent with Grade II diastolic  ?dysfunction (pseudonormalization). Elevated left atrial pressure.  ? 3. Right ventricular systolic function is normal. The right ventricular  ?size is normal.  ? 4. Left atrial size was severely dilated.  ? 5. The mitral valve is normal in structure. Moderate mitral valve  ?regurgitation. No evidence of mitral stenosis. Moderate mitral annular  ?calcification.  ? 6. Tricuspid valve regurgitation is moderate.  ? 7. The aortic valve is tricuspid. Aortic valve regurgitation is trivial.  ?No aortic stenosis is present.  ? 8. The inferior vena cava is normal in size with greater than 50%  ?respiratory variability, suggesting right atrial pressure of 3 mmHg.  ? ? ?04/29/2021: stress myoview ?  Findings are consistent with prior myocardial  infarction. The study is high risk due to reduced systolic function and prior infarct. ?  No ST deviation was noted. ?  LV perfusion is abnormal. There is no evidence of ischemia. There is evidence of infarction. Defect

## 2021-12-12 ENCOUNTER — Encounter: Payer: Self-pay | Admitting: Physician Assistant

## 2021-12-12 ENCOUNTER — Ambulatory Visit (INDEPENDENT_AMBULATORY_CARE_PROVIDER_SITE_OTHER): Payer: Medicare Other | Admitting: Physician Assistant

## 2021-12-12 ENCOUNTER — Encounter: Payer: Self-pay | Admitting: *Deleted

## 2021-12-12 VITALS — BP 142/80 | HR 74 | Ht 62.0 in | Wt 124.6 lb

## 2021-12-12 DIAGNOSIS — I255 Ischemic cardiomyopathy: Secondary | ICD-10-CM

## 2021-12-12 DIAGNOSIS — Z950 Presence of cardiac pacemaker: Secondary | ICD-10-CM | POA: Diagnosis not present

## 2021-12-12 DIAGNOSIS — I2510 Atherosclerotic heart disease of native coronary artery without angina pectoris: Secondary | ICD-10-CM

## 2021-12-12 DIAGNOSIS — I4819 Other persistent atrial fibrillation: Secondary | ICD-10-CM

## 2021-12-12 DIAGNOSIS — I5023 Acute on chronic systolic (congestive) heart failure: Secondary | ICD-10-CM

## 2021-12-12 LAB — CUP PACEART INCLINIC DEVICE CHECK
Battery Remaining Longevity: 103 mo
Battery Voltage: 3.08 V
Brady Statistic RA Percent Paced: 18.73 %
Brady Statistic RV Percent Paced: 69.11 %
Date Time Interrogation Session: 20230505175025
Implantable Lead Implant Date: 20221212
Implantable Lead Implant Date: 20221212
Implantable Lead Implant Date: 20221212
Implantable Lead Location: 753858
Implantable Lead Location: 753859
Implantable Lead Location: 753860
Implantable Lead Model: 3830
Implantable Lead Model: 4798
Implantable Lead Model: 5076
Implantable Pulse Generator Implant Date: 20221212
Lead Channel Impedance Value: 285 Ohm
Lead Channel Impedance Value: 304 Ohm
Lead Channel Impedance Value: 323 Ohm
Lead Channel Impedance Value: 342 Ohm
Lead Channel Impedance Value: 361 Ohm
Lead Channel Impedance Value: 437 Ohm
Lead Channel Impedance Value: 437 Ohm
Lead Channel Impedance Value: 513 Ohm
Lead Channel Impedance Value: 532 Ohm
Lead Channel Impedance Value: 570 Ohm
Lead Channel Impedance Value: 570 Ohm
Lead Channel Impedance Value: 608 Ohm
Lead Channel Impedance Value: 608 Ohm
Lead Channel Impedance Value: 627 Ohm
Lead Channel Pacing Threshold Amplitude: 0.625 V
Lead Channel Pacing Threshold Amplitude: 0.875 V
Lead Channel Pacing Threshold Amplitude: 1 V
Lead Channel Pacing Threshold Pulse Width: 0.4 ms
Lead Channel Pacing Threshold Pulse Width: 0.4 ms
Lead Channel Pacing Threshold Pulse Width: 0.6 ms
Lead Channel Sensing Intrinsic Amplitude: 0.25 mV
Lead Channel Sensing Intrinsic Amplitude: 0.375 mV
Lead Channel Sensing Intrinsic Amplitude: 20.875 mV
Lead Channel Sensing Intrinsic Amplitude: 21.125 mV
Lead Channel Setting Pacing Amplitude: 2.5 V
Lead Channel Setting Pacing Amplitude: 2.5 V
Lead Channel Setting Pacing Amplitude: 3.5 V
Lead Channel Setting Pacing Pulse Width: 0.4 ms
Lead Channel Setting Pacing Pulse Width: 0.6 ms
Lead Channel Setting Sensing Sensitivity: 0.9 mV

## 2021-12-12 MED ORDER — FUROSEMIDE 20 MG PO TABS: 20.0000 mg | ORAL_TABLET | Freq: Every day | ORAL | 0 refills | Status: DC

## 2021-12-12 MED ORDER — AMIODARONE HCL 100 MG PO TABS: 100.0000 mg | ORAL_TABLET | Freq: Every day | ORAL | 0 refills | Status: DC

## 2021-12-12 NOTE — Patient Instructions (Addendum)
Medication Instructions:  ? ? START TAKING : AMIODARONE 100 MG ONCE A DAY  ? ? START TAKING : FUROSEMIDE 20 MG ONCE  A DAY  ? ?*If you need a refill on your cardiac medications before your next appointment, please call your pharmacy* ? ? ?Lab Work: BMET AND MAG TODAY  ? ?RETURN FOR BMET  AT  FOLLOW UP IN 1- WEEKS AFTER  CARDIOVERSION  12-17-21  ? ? ?If you have labs (blood work) drawn today and your tests are completely normal, you will receive your results only by: ?MyChart Message (if you have MyChart) OR ?A paper copy in the mail ?If you have any lab test that is abnormal or we need to change your treatment, we will call you to review the results. ? ? ?Testing/Procedures:  ON 12-17-21.Your physician has recommended that you have a Cardioversion (DCCV). Electrical Cardioversion uses a jolt of electricity to your heart either through paddles or wired patches attached to your chest. This is a controlled, usually prescheduled, procedure. Defibrillation is done under light anesthesia in the hospital, and you usually go home the day of the procedure. This is done to get your heart back into a normal rhythm. You are not awake for the procedure. Please see the instruction sheet given to you today. ? ? ? ? ?Follow-Up: ?At [facility], you and your health needs are our priority.  As part of our continuing mission to provide you with exceptional heart care, we have created designated Provider Care Teams.  These Care Teams include your primary Cardiologist (physician) and Advanced Practice Providers (APPs -  Physician Assistants and Nurse Practitioners) who all work together to provide you with the care you need, when you need it. ? ?We recommend signing up for the patient portal called "MyChart".  Sign up information is provided on this After Visit Summary.  MyChart is used to connect with patients for Virtual Visits (Telemedicine).  Patients are able to view lab/test results, encounter notes, upcoming appointments, etc.   Non-urgent messages can be sent to your provider as well.   ?To learn more about what you can do with MyChart, go to [email or web address].   ? ?Your next appointment:  AFTER CARDIOVERSION  12-17-21 ?1 -2 week(s) ? ?The format for your next appointment:   ?In Person ? ?Provider:   ?You may see Dr. Lovena Le  or one of the following Advanced Practice Providers on your designated Care Team:   ?Tommye Standard, PA-C ?Legrand Como "Jonni Sanger" [place], PA-C  ? ? ?Other Instructions ? ? ?Important Information About Sugar ? ? ? ? ?  ?

## 2021-12-13 LAB — CBC
Hematocrit: 35.9 % (ref 34.0–46.6)
Hemoglobin: 11.8 g/dL (ref 11.1–15.9)
MCH: 30.3 pg (ref 26.6–33.0)
MCHC: 32.9 g/dL (ref 31.5–35.7)
MCV: 92 fL (ref 79–97)
Platelets: 120 10*3/uL — ABNORMAL LOW (ref 150–450)
RBC: 3.9 x10E6/uL (ref 3.77–5.28)
RDW: 13.7 % (ref 11.7–15.4)
WBC: 5.3 10*3/uL (ref 3.4–10.8)

## 2021-12-13 LAB — BASIC METABOLIC PANEL
BUN/Creatinine Ratio: 10 — ABNORMAL LOW (ref 12–28)
BUN: 8 mg/dL (ref 8–27)
CO2: 22 mmol/L (ref 20–29)
Calcium: 9.5 mg/dL (ref 8.7–10.3)
Chloride: 98 mmol/L (ref 96–106)
Creatinine, Ser: 0.77 mg/dL (ref 0.57–1.00)
Glucose: 148 mg/dL — ABNORMAL HIGH (ref 70–99)
Potassium: 4.3 mmol/L (ref 3.5–5.2)
Sodium: 137 mmol/L (ref 134–144)
eGFR: 75 mL/min/{1.73_m2} (ref >59)

## 2021-12-13 LAB — MAGNESIUM: Magnesium: 1.3 mg/dL — ABNORMAL LOW (ref 1.6–2.3)

## 2021-12-15 ENCOUNTER — Telehealth: Payer: Self-pay

## 2021-12-15 MED ORDER — MAGNESIUM OXIDE 400 MG PO TABS: ORAL_TABLET | ORAL | 6 refills | Status: DC

## 2021-12-15 NOTE — Telephone Encounter (Signed)
Called patient about her lab results. Per Tommye Standard PA, Please let her know her mag is low.  Start mag ox '400mg'$  BID for 5 days then daily. Repeat mag level at time of her DCCV. Patient verbalized understanding. Patient's DCCV is in two days. Will let Renee know and see if she wants to give Mag Ox a week to work before repeating lab work.  ?

## 2021-12-15 NOTE — Telephone Encounter (Signed)
-----   Message from [place], [place] sent at 12/15/2021 12:53 PM EDT ----- ?Please let her know her mag is low.  Please start mag ox '400mg'$  BID for 5 days then daily ?Repeat mag level at time of her DCCV please ?

## 2021-12-17 ENCOUNTER — Ambulatory Visit (HOSPITAL_BASED_OUTPATIENT_CLINIC_OR_DEPARTMENT_OTHER): Payer: Medicare Other | Admitting: Certified Registered Nurse Anesthetist

## 2021-12-17 ENCOUNTER — Ambulatory Visit (HOSPITAL_COMMUNITY)
Admission: RE | Admit: 2021-12-17 | Discharge: 2021-12-17 | Disposition: A | Discharge status: Home / Self Care | Payer: Medicare Other | Attending: Cardiology | Admitting: Cardiology

## 2021-12-17 ENCOUNTER — Encounter (HOSPITAL_COMMUNITY): Payer: Self-pay | Admitting: Cardiology

## 2021-12-17 ENCOUNTER — Encounter (HOSPITAL_COMMUNITY)
Admission: RE | Disposition: A | Discharge status: Home / Self Care | Payer: Self-pay | Source: Home / Self Care | Attending: Cardiology

## 2021-12-17 ENCOUNTER — Ambulatory Visit (HOSPITAL_COMMUNITY): Payer: Medicare Other | Admitting: Certified Registered Nurse Anesthetist

## 2021-12-17 DIAGNOSIS — I480 Paroxysmal atrial fibrillation: Secondary | ICD-10-CM

## 2021-12-17 DIAGNOSIS — I252 Old myocardial infarction: Secondary | ICD-10-CM

## 2021-12-17 DIAGNOSIS — I2510 Atherosclerotic heart disease of native coronary artery without angina pectoris: Secondary | ICD-10-CM

## 2021-12-17 DIAGNOSIS — I4891 Unspecified atrial fibrillation: Secondary | ICD-10-CM

## 2021-12-17 DIAGNOSIS — I509 Heart failure, unspecified: Secondary | ICD-10-CM | POA: Diagnosis not present

## 2021-12-17 DIAGNOSIS — I4819 Other persistent atrial fibrillation: Secondary | ICD-10-CM | POA: Diagnosis not present

## 2021-12-17 DIAGNOSIS — I110 Hypertensive heart disease with heart failure: Secondary | ICD-10-CM | POA: Diagnosis not present

## 2021-12-17 HISTORY — PX: CARDIOVERSION: SHX1299

## 2021-12-17 LAB — MAGNESIUM: Magnesium: 1.3 mg/dL — ABNORMAL LOW (ref 1.7–2.4)

## 2021-12-17 SURGERY — CARDIOVERSION
Anesthesia: General

## 2021-12-17 MED ORDER — PROPOFOL 10 MG/ML IV BOLUS
INTRAVENOUS | Status: DC | PRN
  Administered 2021-12-17: 40 mg via INTRAVENOUS

## 2021-12-17 MED ORDER — SODIUM CHLORIDE 0.9 % IV SOLN: INTRAVENOUS | Status: DC

## 2021-12-17 MED ORDER — LIDOCAINE 2% (20 MG/ML) 5 ML SYRINGE
INTRAMUSCULAR | Status: DC | PRN
  Administered 2021-12-17: 40 mg via INTRAVENOUS

## 2021-12-17 NOTE — Anesthesia Preprocedure Evaluation (Signed)
Anesthesia Evaluation  ?Patient identified by MRN, date of birth, ID band ?Patient awake ? ? ? ?Reviewed: ?Allergy & Precautions, NPO status , Patient's Chart, lab work & pertinent test results ? ?History of Anesthesia Complications ?Negative for: history of anesthetic complications ? ?Airway ?Mallampati: II ? ?TM Distance: >3 FB ?Neck ROM: Full ? ? ? Dental ?  ?Pulmonary ?neg pulmonary ROS,  ?  ?Pulmonary exam normal ? ? ? ? ? ? ? Cardiovascular ?hypertension, + CAD, + Past MI, + Cardiac Stents, + CABG and +CHF  ?+ dysrhythmias Atrial Fibrillation + pacemaker (BiV)  ?Rhythm:Irregular Rate:Normal ? ? ?Echo 06/17/21: EF 25-30%, global hypokinesis, interlateral/apical akinesis, g2dd, normal RVSF, severe LAE, mod MR, mod TR, no AS ? ?Stress test 2022: prior MI, no ischemia, mod LV dysfunction ?  ?Neuro/Psych ?negative neurological ROS ?   ? GI/Hepatic ?negative GI ROS, Neg liver ROS,   ?Endo/Other  ?diabetes, Type 2 ? Renal/GU ?negative Renal ROS  ?negative genitourinary ?  ?Musculoskeletal ?negative musculoskeletal ROS ?(+)  ? Abdominal ?  ?Peds ? Hematology ?negative hematology ROS ?(+)   ?Anesthesia Other Findings ? ? Reproductive/Obstetrics ? ?  ? ? ? ? ? ? ? ? ? ? ? ? ? ?  ?  ? ? ? ? ? ? ? ?Anesthesia Physical ?Anesthesia Plan ? ?ASA: 4 ? ?Anesthesia Plan: General  ? ?Post-op Pain Management: Minimal or no pain anticipated  ? ?Induction: Intravenous ? ?PONV Risk Score and Plan: TIVA and Treatment may vary due to age or medical condition ? ?Airway Management Planned: Mask ? ?Additional Equipment: None ? ?Intra-op Plan:  ? ?Post-operative Plan:  ? ?Informed Consent: I have reviewed the patients History and Physical, chart, labs and discussed the procedure including the risks, benefits and alternatives for the proposed anesthesia with the patient or authorized representative who has indicated his/her understanding and acceptance.  ? ? ? ? ? ?Plan Discussed with:  ? ?Anesthesia Plan  Comments:   ? ? ? ? ? ? ?Anesthesia Quick Evaluation ? ?

## 2021-12-17 NOTE — Anesthesia Postprocedure Evaluation (Signed)
Anesthesia Post Note ? ?Patient: Leilani Able Sobecki ? ?Procedure(s) Performed: CARDIOVERSION ? ?  ? ?Patient location during evaluation: Endoscopy ?Anesthesia Type: General ?Level of consciousness: awake and alert ?Pain management: pain level controlled ?Vital Signs Assessment: post-procedure vital signs reviewed and stable ?Respiratory status: spontaneous breathing, nonlabored ventilation and respiratory function stable ?Cardiovascular status: blood pressure returned to baseline and stable ?Postop Assessment: no apparent nausea or vomiting ?Anesthetic complications: no ? ? ?No notable events documented. ? ?Last Vitals:  ?Vitals:  ? 12/17/21 1310 12/17/21 1320  ?BP: (!) 127/51 (!) 149/49  ?Pulse: (!) 54 (!) 56  ?Resp: (!) 22 19  ?Temp:    ?SpO2: 94% 92%  ?  ?Last Pain:  ?Vitals:  ? 12/17/21 1320  ?TempSrc:   ?PainSc: 0-No pain  ? ? ?  ?  ?  ?  ?  ?  ? ?Lidia Collum ? ? ? ? ?

## 2021-12-17 NOTE — Discharge Instructions (Signed)

## 2021-12-17 NOTE — Anesthesia Procedure Notes (Signed)
Procedure Name: General with mask airway ?Date/Time: 12/17/2021 12:43 PM ?Performed by: Dorthea Cove, CRNA ?Pre-anesthesia Checklist: Patient identified, Emergency Drugs available, Patient being monitored, Suction available and Timeout performed ?Patient Re-evaluated:Patient Re-evaluated prior to induction ?Oxygen Delivery Method: Simple face mask ?Preoxygenation: Pre-oxygenation with 100% oxygen ?Induction Type: IV induction ?Placement Confirmation: positive ETCO2 and CO2 detector ?Dental Injury: Teeth and Oropharynx as per pre-operative assessment  ? ? ? ? ?

## 2021-12-17 NOTE — CV Procedure (Signed)
? ? ?  Electrical Cardioversion Procedure Note ?Cynthia [name] ?[id number] ?Dec 16, 1931 ? ?Procedure: Electrical Cardioversion ?Indications:  Atrial Fibrillation ? ?Time Out: Verified patient identification, verified procedure,medications/allergies/relevent history reviewed, required imaging and test results available.  Performed ? ?Procedure Details ? ?The patient was NPO after midnight. Anesthesia was administered at the beside  by Dr.Witman with  propofol.  Cardioversion was performed with synchronized biphasic defibrillation via AP pads with 150 joules.  1 attempt(s) were performed.  The patient converted to normal sinus rhythm. The patient tolerated the procedure well  ? ?IMPRESSION: ? ?Successful cardioversion of atrial fibrillation ? ? ? ?Cynthia [name] ?12/17/2021, 12:56 PM ? ?  ?

## 2021-12-17 NOTE — Interval H&P Note (Signed)
History and Physical Interval Note: ? ?12/17/2021 ?12:37 PM ? ?Cynthia [name]  has presented today for surgery, with the diagnosis of atrial fibrillation.  The various methods of treatment have been discussed with the patient and family. After consideration of risks, benefits and other options for treatment, the patient has consented to  Procedure(s): ?CARDIOVERSION (N/A) as a surgical intervention.  The patient's history has been reviewed, patient examined, no change in status, stable for surgery.  I have reviewed the patient's chart and labs.  Questions were answered to the patient's satisfaction.   ? ? ?[name] ? ? ?

## 2021-12-17 NOTE — Transfer of Care (Signed)
Immediate Anesthesia Transfer of Care Note ? ?Patient: Cynthia [name] ? ?Procedure(s) Performed: CARDIOVERSION ? ?Patient Location: Endoscopy Unit ? ?Anesthesia Type:General ? ?Level of Consciousness: awake and drowsy ? ?Airway & Oxygen Therapy: Patient Spontanous Breathing and Patient connected to face mask oxygen ? ?Post-op Assessment: Report given to RN and Post -op Vital signs reviewed and stable ? ?Post vital signs: Reviewed and stable ? ?Last Vitals:  ?Vitals Value Taken Time  ?BP 125/47 12/17/21 1254  ?Temp    ?Pulse 50 12/17/21 1254  ?Resp 30 12/17/21 1254  ?SpO2 100 % 12/17/21 1254  ? ? ?Last Pain:  ?Vitals:  ? 12/17/21 1100  ?TempSrc: Temporal  ?PainSc: 0-No pain  ?   ? ?  ? ?Complications: No notable events documented. ?

## 2021-12-18 ENCOUNTER — Encounter (HOSPITAL_COMMUNITY): Payer: Self-pay | Admitting: Cardiology

## 2021-12-24 NOTE — Progress Notes (Signed)
Cardiology Office Note Date:  12/24/2021  Patient ID:  Cynthia [name], Cynthia [name] 03/17/1932, MRN [id number] PCP:  Reynold Bowen, MD  Cardiologist: Dr. Acie Fredrickson Electrophysiologist: Dr. Lovena Le     Chief Complaint:  planned f/u  History of Present Illness: Cynthia [name] is a 88 y.o. female with history of CAD ([facility], CABG 1998, PCI 2019), DM, HTN, HLD, arthritis, LBBB, ICM, chronic CHF (combined), and Afib.  Fund with Afib at her wound check visit > Afib clinic, 10/23/21 started on Eliquis and BB titrated  She comes in today to be seen for Dr. Lovena Le, last seen by him 11/04/21,noted worsening HF symptoms though no palpitations or awareness of the AFib.  Felt her overall worsening of symptoms was AFib and started on amiodarone  The patient called 11/28/21, c/o brain fog and some balance issues since starting the amiodarone, and given an appt to come in. Her coreg changed to metoprolol   I saw her 12/12/21 She feels pretty lousy, weak, SOB with less exertion. With the change from coreg to Toprol she thinks her thinking has clear but her legs feel weak, feels like she is unsteady. No syncope. No bleeding She reports compliance with Eliquis, no missed doses Suspected with small fib waves that she was likely 100% AFib and this perhaps why she was feeling poorly recommended DCCV. Volume OL and lasix added  12/17/21: Successful DCCV one shock   TODAY She is accompanied by her nephew Feels no better, no worse. Tired, sluggish.  Still feels a little unbalanced, gets winded easier No near syncope or syncope No rest SOB  Device information MDT CRT-P implanted 07/21/21  Afib/AAD hx Diagnosed via device at her wound check visit 07/31/21   Past Medical History:  Diagnosis Date   Anxiety    Arthritis    Carotid bruit    LEFT   Coronary artery disease    STATUS POST PCI AND LATER CABG   Diabetes mellitus    Dyslipidemia    Dysrhythmia    hx of palpitations and PVC's    History of  chicken pox    History of echocardiogram    Echo September 04, 2022: mild LVH, EF 55-60, no RWMA, Gr 1 DD, MAC, mild MR, mild LAE, mild TR, PASP 31   Hypertension    Measles    hx of    Mumps    hx of    Myocardial infarction (HCC)    PAF (paroxysmal atrial fibrillation) ([place]) 10/22/2021   Palpitations    PVC's (premature ventricular contractions)     Past Surgical History:  Procedure Laterality Date   BIV PACEMAKER INSERTION CRT-P N/A 07/21/2021   Procedure: BIV PACEMAKER INSERTION CRT-P;  Surgeon: Evans Lance, MD;  Location: [facility];  Service: Cardiovascular;  Laterality: N/A;   CARDIAC CATHETERIZATION  06/06/97   IT REVEALA MILD TO MODERATE LEFT VENTRICULAR SYSTOLIC DYSFUNCTION  WITH EF OF 40%. THERE IS PERSISTENT AKINESIS OF THE INFERIOR WALL. THERE IS MILD REGURGITATION PRESENT. THERE IS MILD HYPOKINESIS OF THE ANTERIOR  AND APEX WALL   CARDIAC CATHETERIZATION N/A 07/27/2016   Procedure: Left Heart Cath and Cors/Grafts Angiography;  Surgeon: Nelva Bush, MD;  Location: [facility];  Service: Cardiovascular;  Laterality: N/A;   CARDIOVERSION N/A 12/17/2021   Procedure: CARDIOVERSION;  Surgeon: Jerline Pain, MD;  Location: [facility];  Service: Cardiovascular;  Laterality: N/A;   CORONARY ARTERY BYPASS GRAFT     TONSILLECTOMY     TOTAL HIP ARTHROPLASTY  Right 03/12/2015   Procedure: RIGHT TOTAL HIP ARTHROPLASTY ANTERIOR APPROACH;  Surgeon: Paralee Cancel, MD;  Location: [facility];  Service: Orthopedics;  Laterality: Right;    Current Outpatient Medications  Medication Sig Dispense Refill   amiodarone (PACERONE) 100 MG tablet Take 1 tablet (100 mg total) by mouth daily. 90 tablet 0   amoxicillin (AMOXIL) 500 MG capsule Take 2,000 mg by mouth See admin instructions. Take 2000 mg by mouth prior to dental procedures     apixaban (ELIQUIS) 2.5 MG TABS tablet Take 1 tablet (2.5 mg total) by mouth 2 (two) times daily. 60 tablet 3   atorvastatin (LIPITOR) 40 MG tablet TAKE 1  TABLET DAILY 90 tablet 3   B-D INS SYR ULTRAFINE 1CC/31G 31G X 5/16" 1 ML MISC      BD PEN NEEDLE NANO U/F 32G X 4 MM MISC Inject 1 each into the skin as directed. With insulin  6   Calcium Carb-Cholecalciferol (CALCIUM 600 + D PO) Take 1 tablet by mouth 2 (two) times daily.     Coenzyme Q10 (COQ10) 200 MG CAPS Take 200 mg by mouth every evening.     ezetimibe (ZETIA) 10 MG tablet TAKE 1 TABLET DAILY 90 tablet 3   furosemide (LASIX) 20 MG tablet Take 1 tablet (20 mg total) by mouth daily. 90 tablet 0   insulin aspart (NOVOLOG FLEXPEN) 100 UNIT/ML FlexPen Inject 6 Units into the skin 3 (three) times daily with meals.     insulin degludec (TRESIBA FLEXTOUCH) 100 UNIT/ML FlexTouch Pen Inject 11 Units into the skin daily.     isosorbide mononitrate (IMDUR) 60 MG 24 hr tablet TAKE 1 TABLET BY MOUTH EVERY DAY 90 tablet 1   magnesium oxide (MAG-OX) 400 MG tablet Take one tablet by mouth twice a day for 5 days, then take one tablet by mouth daily. 35 tablet 6   metoprolol succinate (TOPROL XL) 25 MG 24 hr tablet Take 1 tablet (25 mg total) by mouth daily. (Patient taking differently: Take 25 mg by mouth at bedtime.) 30 tablet 11   Multiple Vitamin (MULTIVITAMIN) tablet Take 1 tablet by mouth every morning.      nitroGLYCERIN (NITROSTAT) 0.4 MG SL tablet Place 1 tablet (0.4 mg total) under the tongue every 5 (five) minutes as needed for chest pain. 25 tablet 3   ONE TOUCH ULTRA TEST test strip 2 (two) times daily. test blood sugar  5   ONETOUCH DELICA LANCETS 84O MISC CHECK BLOOD SUGARS 2 TIMES A DAY  5   Polyethyl Glycol-Propyl Glycol (SYSTANE OP) Apply 1 drop to eye 2 (two) times daily as needed (dry/scratchy eyes).     sacubitril-valsartan (ENTRESTO) 24-26 MG Take 1 tablet by mouth 2 (two) times daily. 60 tablet 11   sitaGLIPtin-metformin (JANUMET) 50-1000 MG tablet Take 1 tablet by mouth 2 (two) times daily with a meal.     vitamin C (ASCORBIC ACID) 500 MG tablet Take 500 mg by mouth every evening.       No current facility-administered medications for this visit.    Allergies:   Patient has no known allergies.   Social History:  The patient  reports that she has never smoked. She has never used smokeless tobacco. She reports that she does not drink alcohol and does not use drugs.   Family History:  The patient's family history includes Cancer in her father; Heart attack in her mother; Stroke in her father.  ROS:  Please see the history of present illness.  All other systems are reviewed and otherwise negative.   PHYSICAL EXAM:  VS:  There were no vitals taken for this visit. BMI: There is no height or weight on file to calculate BMI. Well nourished, well developed, in no acute distress HEENT: normocephalic, atraumatic Neck: no JVD, carotid bruits or masses Cardiac:  RRR, bradycardic ; no significant murmurs, no rubs, or gallops Lungs:  CTA b/l, no wheezing, rhonchi or rales Abd: soft, nontender MS: no deformity, age appropriate atrophy Ext: 1+ edema b/l Skin: warm and dry, no rash Neuro:  No gross deficits appreciated Psych: euthymic mood, full affect  PPM site is stable, no tethering or discomfort   EKG:  done today and reviewed by myself AV paced at 50bpm, QRS is a RBBB morphology 123m  Device interrogation done today and reviewed by myself:  Battery and lead measurements are good NO P WAVES today at 40 Brief Aflutter on 5/23, none otherwise since DCCV No V arrhythmias BP/effective 99.4%  06/17/2021: TTE  1. Global hypokinesis with inferolateral and apical akinesis; overall  severe LV dysfunction.   2. Left ventricular ejection fraction, by estimation, is 25 to 30%. The  left ventricle has severely decreased function. The left ventricle  demonstrates global hypokinesis. Left ventricular diastolic parameters are  consistent with Grade II diastolic  dysfunction (pseudonormalization). Elevated left atrial pressure.   3. Right ventricular systolic function is  normal. The right ventricular  size is normal.   4. Left atrial size was severely dilated.   5. The mitral valve is normal in structure. Moderate mitral valve  regurgitation. No evidence of mitral stenosis. Moderate mitral annular  calcification.   6. Tricuspid valve regurgitation is moderate.   7. The aortic valve is tricuspid. Aortic valve regurgitation is trivial.  No aortic stenosis is present.   8. The inferior vena cava is normal in size with greater than 50%  respiratory variability, suggesting right atrial pressure of 3 mmHg.    04/29/2021: stress myoview   Findings are consistent with prior myocardial infarction. The study is high risk due to reduced systolic function and prior infarct.   No ST deviation was noted.   LV perfusion is abnormal. There is no evidence of ischemia. There is evidence of infarction. Defect 1: There is a large defect with severe reduction in uptake present in the apical to basal inferior, inferolateral and apex location(s) that is fixed. There is abnormal wall motion in the defect area. Consistent with infarction.   Left ventricular function is abnormal. There was a single regional abnormality. Nuclear stress EF: 39 %. The left ventricular ejection fraction is moderately decreased (30-44%).   Prior study from 06/21/2009 is not available for comparison.   Prior infarct with no ischemia.   07/27/2016: [facility] Conclusions: Significant single-vessel coronary artery disease, with ostial occlusion of LCx. Mild to moderate, non-obstructive CAD involving the ostial LMCA and mid RCA.  Proximal/mid LAD stent is patent with minimal in-stent restenosis. Patent SVG to OM2 and SVG to D2.  SVG to D2 demonstrates 30% proximal graft stenosis. Ostially occluded SVG to RCA/PDA. Atretic LIMA. Mildly elevated left ventricular filling pressure. Moderately reduced left ventricular contraction with inferior and apical akinesis (LVEF 35-40%).   Recommendations: Optimize medical  therapy, including addition of isosorbide mononitrate and furosemide/KCl. Continue aggressive secondary prevention. Follow-up in the office in ~1 week, with BMET at that time to reassess renal function and potassium.  Recent Labs: 03/17/2021: ALT 19 12/12/2021: BUN 8; Creatinine, Ser 0.77; Hemoglobin 11.8; Platelets 120;  Potassium 4.3; Sodium 137 12/17/2021: Magnesium 1.3  03/17/2021: Chol/HDL Ratio 2.1; Cholesterol, Total 103; HDL 49; LDL Chol Calc (NIH) 42; Triglycerides 50   Estimated Creatinine Clearance: 39.2 mL/min (by C-G formula based on SCr of 0.77 mg/dL).   Wt Readings from Last 3 Encounters:  12/17/21 124 lb 9.6 oz (56.5 kg)  12/12/21 124 lb 9.6 oz (56.5 kg)  11/04/21 130 lb (59 kg)     Other studies reviewed: Additional studies/records reviewed today include: summarized above  ASSESSMENT AND PLAN:  PPM Intact function Programming changes  She is atrially dependent today I have increased base pacing rate to 60 and turned on rate response   Persistent AFib CHA2DS2Vasc is 8, on Eliquis, appropriately dosed for age/weight Continue low dose amiodarone for now Amio labs today  CAD No anginal symptoms On BB, nitrate, zetia C/w Dr. Acie Fredrickson  ICM acute/chronic CHF (combined) She has edema, OptiVol is way up but has made a downward trend Will have her take the lasix 8m BID for 3 days then back to daily EKG morphology and QRS duration looks better then pre-device Hopefully in SR with better rates she will start to feel better BMET today on lasix  6. Hypomagnesemia Follow up level today     Disposition: I will see her back in a couple weeks, hopefully she is feeling better, for now have elected not to stop amio just yet in effort to maintain SR  Current medicines are reviewed at length with the patient today.  The patient did not have any concerns regarding medicines.  SVenetia Night PA-C 12/24/2021 10:00 AM     [place] [phone](office)  [phone](fax)

## 2021-12-25 ENCOUNTER — Other Ambulatory Visit: Payer: Medicare Other

## 2021-12-25 ENCOUNTER — Encounter: Payer: Self-pay | Admitting: Physician Assistant

## 2021-12-25 ENCOUNTER — Ambulatory Visit (INDEPENDENT_AMBULATORY_CARE_PROVIDER_SITE_OTHER): Payer: Medicare Other | Admitting: Physician Assistant

## 2021-12-25 VITALS — BP 138/70 | HR 50 | Ht 62.0 in | Wt 122.0 lb

## 2021-12-25 DIAGNOSIS — Z950 Presence of cardiac pacemaker: Secondary | ICD-10-CM

## 2021-12-25 DIAGNOSIS — Z79899 Other long term (current) drug therapy: Secondary | ICD-10-CM

## 2021-12-25 DIAGNOSIS — I4819 Other persistent atrial fibrillation: Secondary | ICD-10-CM

## 2021-12-25 DIAGNOSIS — I5042 Chronic combined systolic (congestive) and diastolic (congestive) heart failure: Secondary | ICD-10-CM

## 2021-12-25 DIAGNOSIS — I255 Ischemic cardiomyopathy: Secondary | ICD-10-CM | POA: Diagnosis not present

## 2021-12-25 LAB — COMPREHENSIVE METABOLIC PANEL
ALT: 17 IU/L (ref 0–32)
AST: 22 IU/L (ref 0–40)
Albumin/Globulin Ratio: 2.4 — ABNORMAL HIGH (ref 1.2–2.2)
Albumin: 4.5 g/dL (ref 3.6–4.6)
Alkaline Phosphatase: 83 IU/L (ref 44–121)
BUN/Creatinine Ratio: 12 (ref 12–28)
BUN: 8 mg/dL (ref 8–27)
Bilirubin Total: 1.3 mg/dL — ABNORMAL HIGH (ref 0.0–1.2)
CO2: 26 mmol/L (ref 20–29)
Calcium: 9.3 mg/dL (ref 8.7–10.3)
Chloride: 94 mmol/L — ABNORMAL LOW (ref 96–106)
Creatinine, Ser: 0.66 mg/dL (ref 0.57–1.00)
Globulin, Total: 1.9 g/dL (ref 1.5–4.5)
Glucose: 161 mg/dL — ABNORMAL HIGH (ref 70–99)
Potassium: 4.1 mmol/L (ref 3.5–5.2)
Sodium: 132 mmol/L — ABNORMAL LOW (ref 134–144)
Total Protein: 6.4 g/dL (ref 6.0–8.5)
eGFR: 85 mL/min/{1.73_m2} (ref >59)

## 2021-12-25 LAB — CUP PACEART INCLINIC DEVICE CHECK
Battery Remaining Longevity: 106 mo
Battery Voltage: 3.07 V
Brady Statistic AP VP Percent: 85.07 %
Brady Statistic AP VS Percent: 0.06 %
Brady Statistic AS VP Percent: 14.8 %
Brady Statistic AS VS Percent: 0.07 %
Brady Statistic RA Percent Paced: 82.66 %
Brady Statistic RV Percent Paced: 99.43 %
Date Time Interrogation Session: 20230518095600
Implantable Lead Implant Date: 20221212
Implantable Lead Implant Date: 20221212
Implantable Lead Implant Date: 20221212
Implantable Lead Location: 753858
Implantable Lead Location: 753859
Implantable Lead Location: 753860
Implantable Lead Model: 3830
Implantable Lead Model: 4798
Implantable Lead Model: 5076
Implantable Pulse Generator Implant Date: 20221212
Lead Channel Impedance Value: 304 Ohm
Lead Channel Impedance Value: 323 Ohm
Lead Channel Impedance Value: 323 Ohm
Lead Channel Impedance Value: 361 Ohm
Lead Channel Impedance Value: 380 Ohm
Lead Channel Impedance Value: 437 Ohm
Lead Channel Impedance Value: 475 Ohm
Lead Channel Impedance Value: 532 Ohm
Lead Channel Impedance Value: 589 Ohm
Lead Channel Impedance Value: 608 Ohm
Lead Channel Impedance Value: 608 Ohm
Lead Channel Impedance Value: 627 Ohm
Lead Channel Impedance Value: 646 Ohm
Lead Channel Impedance Value: 646 Ohm
Lead Channel Pacing Threshold Amplitude: 0.625 V
Lead Channel Pacing Threshold Amplitude: 0.75 V
Lead Channel Pacing Threshold Amplitude: 1 V
Lead Channel Pacing Threshold Pulse Width: 0.4 ms
Lead Channel Pacing Threshold Pulse Width: 0.4 ms
Lead Channel Pacing Threshold Pulse Width: 0.6 ms
Lead Channel Sensing Intrinsic Amplitude: 0.75 mV
Lead Channel Sensing Intrinsic Amplitude: 1 mV
Lead Channel Sensing Intrinsic Amplitude: 19.5 mV
Lead Channel Sensing Intrinsic Amplitude: 20.75 mV
Lead Channel Setting Pacing Amplitude: 2.5 V
Lead Channel Setting Pacing Amplitude: 2.5 V
Lead Channel Setting Pacing Amplitude: 2.5 V
Lead Channel Setting Pacing Pulse Width: 0.4 ms
Lead Channel Setting Pacing Pulse Width: 0.6 ms
Lead Channel Setting Sensing Sensitivity: 0.9 mV

## 2021-12-25 LAB — TSH: TSH: 3.63 u[IU]/mL (ref 0.450–4.500)

## 2021-12-25 LAB — MAGNESIUM: Magnesium: 1.3 mg/dL — ABNORMAL LOW (ref 1.6–2.3)

## 2021-12-25 NOTE — Patient Instructions (Addendum)
Medication Instructions:    FOR THREE DAYS ONLY: TAKE LASIX 20 TWICE A DAY THEN : RESUME BACK TO DAILY.   *If you need a refill on your cardiac medications before your next appointment, please call your pharmacy*   Lab Work:   [facility] AND [place]    If you have labs (blood work) drawn today and your tests are completely normal, you will receive your results only by: [place] (if you have MyChart) OR A paper copy in the mail If you have any lab test that is abnormal or we need to change your treatment, we will call you to review the results.   Testing/Procedures: NONE ORDERED  TODAY    Follow-Up: At [facility], you and your health needs are our priority.  As part of our continuing mission to provide you with exceptional heart care, we have created designated Provider Care Teams.  These Care Teams include your primary Cardiologist (physician) and Advanced Practice Providers (APPs -  Physician Assistants and Nurse Practitioners) who all work together to provide you with the care you need, when you need it.  We recommend signing up for the patient portal called "MyChart".  Sign up information is provided on this After Visit Summary.  MyChart is used to connect with patients for Virtual Visits (Telemedicine).  Patients are able to view lab/test results, encounter notes, upcoming appointments, etc.  Non-urgent messages can be sent to your provider as well.   To learn more about what you can do with MyChart, go to [email or web address].    Your next appointment:   2 week(s)  The format for your next appointment:   In Person  Provider:   You will see one of the following Advanced Practice Providers on your designated Care Team:   Tommye Standard, [place]     Other Instructions   Important Information About Sugar

## 2021-12-29 ENCOUNTER — Other Ambulatory Visit: Payer: Self-pay | Admitting: *Deleted

## 2021-12-29 DIAGNOSIS — Z79899 Other long term (current) drug therapy: Secondary | ICD-10-CM

## 2021-12-29 MED ORDER — MAGNESIUM OXIDE 400 MG PO TABS: 400.0000 mg | ORAL_TABLET | Freq: Two times a day (BID) | ORAL | 1 refills | Status: DC

## 2022-01-06 NOTE — Progress Notes (Unsigned)
Cardiology Office Note Date:  01/06/2022  Patient ID:  Cynthia [name], Cynthia [name] 1932-05-12, MRN [id number] PCP:  Reynold Bowen, MD  Cardiologist: Dr. Acie Fredrickson Electrophysiologist: Dr. Lovena Le     Chief Complaint:  planned f/u  History of Present Illness: Cynthia [name] is a 88 y.o. female with history of CAD ([place], CABG 1998, PCI 2019), DM, HTN, HLD, arthritis, LBBB, ICM, chronic CHF (combined), and Afib.  Fund with Afib at her wound check visit > Afib clinic, 10/23/21 started on Eliquis and BB titrated  She comes in today to be seen for Dr. Lovena Le, last seen by him 11/04/21,noted worsening HF symptoms though no palpitations or awareness of the AFib.  Felt her overall worsening of symptoms was AFib and started on amiodarone  The patient called 11/28/21, c/o brain fog and some balance issues since starting the amiodarone, and given an appt to come in. Her coreg changed to metoprolol   I saw her 12/12/21 She feels pretty lousy, weak, SOB with less exertion. With the change from coreg to Toprol she thinks her thinking has clear but her legs feel weak, feels like she is unsteady. No syncope. No bleeding She reports compliance with Eliquis, no missed doses Suspected with small fib waves that she was likely 100% AFib and this perhaps why she was feeling poorly recommended DCCV. Volume OL and lasix added  12/17/21: Successful DCCV one shock   I saw her 12/25/21 She is accompanied by her nephew Feels no better, no worse. Tired, sluggish.  Still feels a little unbalanced, gets winded easier No near syncope or syncope No rest SOB Kept on low dose amio given she seemed to be maintaining SR She was atrially dependent and base pacing rate increased from 50 > 60 and rate response tourned on. She appeared volume OL and Lasix dose pulsed Labs planned  Amio labs renal function, K+ were OK, mag again 1.4 and Mag Ox increased  TODAY Long discussion today on symptoms, onset/chronicity. Since  our visit she thinks maybe has some better day and then worse days.  This seems an improvement from our last visit, though as we talk at length today, seems like these symptoms, fatigue, weakness, legs feel weak, poor appetite, all started about October or so last year, and w/u led to the implant of her device that she thought was going to make her feel better but did not. To best decide if amiodarone is playing a role in symptoms, today seems all of this started prior to the amiodarone. She denies any CP, palpitations or cardiac awareness No rest SOB, some mild DOE Mentions the swelling in her feet though is better all of a sudden. No near syncope or syncope. No bleeding or signs of bleeding  Device information MDT CRT-P implanted 07/21/21  Afib/AAD hx Diagnosed via device at her wound check visit 07/31/21   Past Medical History:  Diagnosis Date   Anxiety    Arthritis    Carotid bruit    LEFT   Coronary artery disease    STATUS POST PCI AND LATER CABG   Diabetes mellitus    Dyslipidemia    Dysrhythmia    hx of palpitations and PVC's    History of chicken pox    History of echocardiogram    Echo 09/10/2022: mild LVH, EF 55-60, no RWMA, Gr 1 DD, MAC, mild MR, mild LAE, mild TR, PASP 31   Hypertension    Measles    hx of  Mumps    hx of    Myocardial infarction ([place])    PAF (paroxysmal atrial fibrillation) ([place]) 10/22/2021   Palpitations    PVC's (premature ventricular contractions)     Past Surgical History:  Procedure Laterality Date   BIV PACEMAKER INSERTION CRT-P N/A 07/21/2021   Procedure: BIV PACEMAKER INSERTION CRT-P;  Surgeon: Evans Lance, MD;  Location: [place];  Service: Cardiovascular;  Laterality: N/A;   CARDIAC CATHETERIZATION  06/06/97   IT REVEALA MILD TO MODERATE LEFT VENTRICULAR SYSTOLIC DYSFUNCTION  WITH EF OF 40%. THERE IS PERSISTENT AKINESIS OF THE INFERIOR WALL. THERE IS MILD REGURGITATION PRESENT. THERE IS MILD HYPOKINESIS OF THE ANTERIOR   AND APEX WALL   CARDIAC CATHETERIZATION N/A 07/27/2016   Procedure: Left Heart Cath and Cors/Grafts Angiography;  Surgeon: Nelva Bush, MD;  Location: [facility];  Service: Cardiovascular;  Laterality: N/A;   CARDIOVERSION N/A 12/17/2021   Procedure: CARDIOVERSION;  Surgeon: Jerline Pain, MD;  Location: [facility];  Service: Cardiovascular;  Laterality: N/A;   CORONARY ARTERY BYPASS GRAFT     TONSILLECTOMY     TOTAL HIP ARTHROPLASTY Right 03/12/2015   Procedure: RIGHT TOTAL HIP ARTHROPLASTY ANTERIOR APPROACH;  Surgeon: Paralee Cancel, MD;  Location: [facility];  Service: Orthopedics;  Laterality: Right;    Current Outpatient Medications  Medication Sig Dispense Refill   amiodarone (PACERONE) 100 MG tablet Take 1 tablet (100 mg total) by mouth daily. 90 tablet 0   amoxicillin (AMOXIL) 500 MG capsule Take 2,000 mg by mouth See admin instructions. Take 2000 mg by mouth prior to dental procedures     apixaban (ELIQUIS) 2.5 MG TABS tablet Take 1 tablet (2.5 mg total) by mouth 2 (two) times daily. 60 tablet 3   atorvastatin (LIPITOR) 40 MG tablet TAKE 1 TABLET DAILY 90 tablet 3   B-D INS SYR ULTRAFINE 1CC/31G 31G X 5/16" 1 ML MISC      BD PEN NEEDLE NANO U/F 32G X 4 MM MISC Inject 1 each into the skin as directed. With insulin  6   Calcium Carb-Cholecalciferol (CALCIUM 600 + D PO) Take 1 tablet by mouth 2 (two) times daily.     Coenzyme Q10 (COQ10) 200 MG CAPS Take 200 mg by mouth every evening.     ezetimibe (ZETIA) 10 MG tablet TAKE 1 TABLET DAILY 90 tablet 3   furosemide (LASIX) 20 MG tablet Take 1 tablet (20 mg total) by mouth daily. 90 tablet 0   insulin aspart (NOVOLOG FLEXPEN) 100 UNIT/ML FlexPen Inject 6 Units into the skin 3 (three) times daily with meals.     insulin degludec (TRESIBA FLEXTOUCH) 100 UNIT/ML FlexTouch Pen Inject 11 Units into the skin daily.     isosorbide mononitrate (IMDUR) 60 MG 24 hr tablet TAKE 1 TABLET BY MOUTH EVERY DAY 90 tablet 1   magnesium oxide  (MAG-OX) 400 MG tablet Take 1 tablet (400 mg total) by mouth 2 (two) times daily. Take one tablet by mouth twice a day 180 tablet 1   metoprolol succinate (TOPROL XL) 25 MG 24 hr tablet Take 1 tablet (25 mg total) by mouth daily. (Patient taking differently: Take 25 mg by mouth at bedtime.) 30 tablet 11   Multiple Vitamin (MULTIVITAMIN) tablet Take 1 tablet by mouth every morning.      nitroGLYCERIN (NITROSTAT) 0.4 MG SL tablet Place 1 tablet (0.4 mg total) under the tongue every 5 (five) minutes as needed for chest pain. 25 tablet 3  ONE TOUCH ULTRA TEST test strip 2 (two) times daily. test blood sugar  5   ONETOUCH DELICA LANCETS 17B MISC CHECK BLOOD SUGARS 2 TIMES A DAY  5   Polyethyl Glycol-Propyl Glycol (SYSTANE OP) Apply 1 drop to eye 2 (two) times daily as needed (dry/scratchy eyes).     sacubitril-valsartan (ENTRESTO) 24-26 MG Take 1 tablet by mouth 2 (two) times daily. 60 tablet 11   sitaGLIPtin-metformin (JANUMET) 50-1000 MG tablet Take 1 tablet by mouth 2 (two) times daily with a meal.     vitamin C (ASCORBIC ACID) 500 MG tablet Take 500 mg by mouth every evening.      No current facility-administered medications for this visit.    Allergies:   Hydrochlorothiazide   Social History:  The patient  reports that she has never smoked. She has never used smokeless tobacco. She reports that she does not drink alcohol and does not use drugs.   Family History:  The patient's family history includes Cancer in her father; Heart attack in her mother; Stroke in her father.  ROS:  Please see the history of present illness.    All other systems are reviewed and otherwise negative.   PHYSICAL EXAM:  VS:  There were no vitals taken for this visit. BMI: There is no height or weight on file to calculate BMI. Well nourished, well developed, in no acute distress HEENT: normocephalic, atraumatic Neck: no JVD, carotid bruits or masses Cardiac:  RRR, bradycardic ; no significant murmurs, no rubs, or  gallops Lungs:  CTA b/l, no wheezing, rhonchi or rales Abd: soft, nontender MS: no deformity, age appropriate, perhaps advanced atrophy Ext: no edema b/l Skin: warm and dry, no rash Neuro:  No gross deficits appreciated Psych: euthymic mood, full affect  PPM site is stable, no tethering or discomfort, she is very thin, no erosion   EKG:  not done today  Device interrogation done today and reviewed by myself:  Battery and lead measurements are good NO P WAVES again today at 40 She has had more Afib, long episodes. 20% burden, rate controlled  HR histograms do look better with some HR variability now, rates towards the 80s  06/17/2021: TTE  1. Global hypokinesis with inferolateral and apical akinesis; overall  severe LV dysfunction.   2. Left ventricular ejection fraction, by estimation, is 25 to 30%. The  left ventricle has severely decreased function. The left ventricle  demonstrates global hypokinesis. Left ventricular diastolic parameters are  consistent with Grade II diastolic  dysfunction (pseudonormalization). Elevated left atrial pressure.   3. Right ventricular systolic function is normal. The right ventricular  size is normal.   4. Left atrial size was severely dilated.   5. The mitral valve is normal in structure. Moderate mitral valve  regurgitation. No evidence of mitral stenosis. Moderate mitral annular  calcification.   6. Tricuspid valve regurgitation is moderate.   7. The aortic valve is tricuspid. Aortic valve regurgitation is trivial.  No aortic stenosis is present.   8. The inferior vena cava is normal in size with greater than 50%  respiratory variability, suggesting right atrial pressure of 3 mmHg.    04/29/2021: stress myoview   Findings are consistent with prior myocardial infarction. The study is high risk due to reduced systolic function and prior infarct.   No ST deviation was noted.   LV perfusion is abnormal. There is no evidence of ischemia. There  is evidence of infarction. Defect 1: There is a large defect with  severe reduction in uptake present in the apical to basal inferior, inferolateral and apex location(s) that is fixed. There is abnormal wall motion in the defect area. Consistent with infarction.   Left ventricular function is abnormal. There was a single regional abnormality. Nuclear stress EF: 39 %. The left ventricular ejection fraction is moderately decreased (30-44%).   Prior study from 06/21/2009 is not available for comparison.   Prior infarct with no ischemia.   07/27/2016: [facility] Conclusions: Significant single-vessel coronary artery disease, with ostial occlusion of LCx. Mild to moderate, non-obstructive CAD involving the ostial LMCA and mid RCA.  Proximal/mid LAD stent is patent with minimal in-stent restenosis. Patent SVG to OM2 and SVG to D2.  SVG to D2 demonstrates 30% proximal graft stenosis. Ostially occluded SVG to RCA/PDA. Atretic LIMA. Mildly elevated left ventricular filling pressure. Moderately reduced left ventricular contraction with inferior and apical akinesis (LVEF 35-40%).   Recommendations: Optimize medical therapy, including addition of isosorbide mononitrate and furosemide/KCl. Continue aggressive secondary prevention. Follow-up in the office in ~1 week, with BMET at that time to reassess renal function and potassium.  Recent Labs: 12/12/2021: Hemoglobin 11.8; Platelets 120 12/25/2021: ALT 17; BUN 8; Creatinine, Ser 0.66; Magnesium 1.3; Potassium 4.1; Sodium 132; TSH 3.630  03/17/2021: Chol/HDL Ratio 2.1; Cholesterol, Total 103; HDL 49; LDL Chol Calc (NIH) 42; Triglycerides 50   Estimated Creatinine Clearance: 38.4 mL/min (by C-G formula based on SCr of 0.66 mg/dL).   Wt Readings from Last 3 Encounters:  12/25/21 122 lb (55.3 kg)  12/17/21 124 lb 9.6 oz (56.5 kg)  12/12/21 124 lb 9.6 oz (56.5 kg)     Other studies reviewed: Additional studies/records reviewed today include: summarized  above  ASSESSMENT AND PLAN:  PPM Intact function No programming changes made   Persistent AFib CHA2DS2Vasc is 8, on Eliquis, appropriately dosed for age/weight She is having more Afib, in lengthy discussion today symptoms onset prior to amiodarone Resume amiodarone 291m QD  CAD No anginal symptoms On BB, nitrate, zetia C/w Dr. NAcie Fredrickson ICM acute/chronic CHF (combined) OptiVol with a sharp turn downward, exam also without signs of volume OL No med changes for now Update her echo, hopefully looks better  6. Hypomagnesemia Repeat today on BID dosing   Suspect her fatigue, weakness is multifactorial and pre-date amiodarone Update her echo EKG post device is improved Effective BP 97.5% Hopefully AF burden will improve with increased amio. Weight loss perhaps diuresis given OptiVol observation. Follow for now, BP is OK, no symptoms or signs of dehydration   Disposition: She sees Dr. TLovena Lein a month or so.  Current medicines are reviewed at length with the patient today.  The patient did not have any concerns regarding medicines.  SVenetia Night PA-C 01/06/2022 8:04 AM     [place] [phone](office)  [phone](fax)

## 2022-01-07 ENCOUNTER — Encounter: Payer: Self-pay | Admitting: Physician Assistant

## 2022-01-07 ENCOUNTER — Ambulatory Visit (INDEPENDENT_AMBULATORY_CARE_PROVIDER_SITE_OTHER): Payer: Medicare Other | Admitting: Physician Assistant

## 2022-01-07 VITALS — BP 157/78 | HR 68 | Resp 16 | Ht 62.0 in | Wt 110.6 lb

## 2022-01-07 DIAGNOSIS — Z950 Presence of cardiac pacemaker: Secondary | ICD-10-CM | POA: Diagnosis not present

## 2022-01-07 DIAGNOSIS — I480 Paroxysmal atrial fibrillation: Secondary | ICD-10-CM

## 2022-01-07 DIAGNOSIS — I5022 Chronic systolic (congestive) heart failure: Secondary | ICD-10-CM

## 2022-01-07 DIAGNOSIS — I255 Ischemic cardiomyopathy: Secondary | ICD-10-CM

## 2022-01-07 DIAGNOSIS — I429 Cardiomyopathy, unspecified: Secondary | ICD-10-CM | POA: Diagnosis not present

## 2022-01-07 DIAGNOSIS — R0600 Dyspnea, unspecified: Secondary | ICD-10-CM

## 2022-01-07 LAB — MAGNESIUM: Magnesium: 1.3 mg/dL — ABNORMAL LOW (ref 1.6–2.3)

## 2022-01-07 MED ORDER — NITROGLYCERIN 0.4 MG SL SUBL: 0.4000 mg | SUBLINGUAL_TABLET | SUBLINGUAL | 3 refills | Status: AC | PRN

## 2022-01-07 MED ORDER — AMIODARONE HCL 200 MG PO TABS: 200.0000 mg | ORAL_TABLET | Freq: Every day | ORAL | 2 refills | Status: DC

## 2022-01-07 NOTE — Patient Instructions (Addendum)
Medication Instructions:   START TAKING AMIODARONE 200 MG ONCE A DAY   *If you need a refill on your cardiac medications before your next appointment, please call your pharmacy*   Lab Work: MAGNESIUM  TODAY    If you have labs (blood work) drawn today and your tests are completely normal, you will receive your results only by: [place] (if you have MyChart) OR A paper copy in the mail If you have any lab test that is abnormal or we need to change your treatment, we will call you to review the results.   Testing/Procedures:  Your physician has requested that you have an echocardiogram. Echocardiography is a painless test that uses sound waves to create images of your heart. It provides your doctor with information about the size and shape of your heart and how well your heart's chambers and valves are working. This procedure takes approximately one hour. There are no restrictions for this procedure.    Follow-Up: At [facility], you and your health needs are our priority.  As part of our continuing mission to provide you with exceptional heart care, we have created designated Provider Care Teams.  These Care Teams include your primary Cardiologist (physician) and Advanced Practice Providers (APPs -  Physician Assistants and Nurse Practitioners) who all work together to provide you with the care you need, when you need it.  We recommend signing up for the patient portal called "MyChart".  Sign up information is provided on this After Visit Summary.  MyChart is used to connect with patients for Virtual Visits (Telemedicine).  Patients are able to view lab/test results, encounter notes, upcoming appointments, etc.  Non-urgent messages can be sent to your provider as well.   To learn more about what you can do with MyChart, go to [email or web address].    Your next appointment:  AS SCHEDULED  TODAY    The format for your next appointment:   In Person  Provider:   Virl Axe,  MD{  1 Other Instructions   Important Information About Sugar

## 2022-01-08 ENCOUNTER — Other Ambulatory Visit: Payer: Self-pay | Admitting: *Deleted

## 2022-01-08 ENCOUNTER — Telehealth: Payer: Self-pay | Admitting: *Deleted

## 2022-01-08 MED ORDER — MAGNESIUM OXIDE 400 MG PO TABS
400.0000 mg | ORAL_TABLET | Freq: Three times a day (TID) | ORAL | 1 refills | Status: DC
Start: 2022-01-08 — End: 2022-07-01

## 2022-01-08 NOTE — Telephone Encounter (Signed)
Lvm to call back at direct clinic number [phone]

## 2022-01-08 NOTE — Telephone Encounter (Signed)
-----   Message from [facility], [place] sent at 01/07/2022  7:35 PM EDT ----- Unclear why her magnesium is so persistently low.  I will reach out MD and pharmacist for further review.  I do not think of lasix as wasting mag, she is not on a PPI that I see.  Her other electrolytes and renal function have been OK. I don't think this is whey she feels poorly though either.  In the meantime, please have her take the Mag Ox TID

## 2022-01-13 ENCOUNTER — Telehealth: Payer: Self-pay | Admitting: *Deleted

## 2022-01-13 NOTE — Telephone Encounter (Signed)
Spoke with patient who has not spoken to Pcp yet but has appointment 6-16-at 2pm and would prefer to go to that office for labs due to transportation. Patient mention that she had forgot to start  Mag ox 400 mg three times a day but will start today.

## 2022-01-13 NOTE — Telephone Encounter (Signed)
-----   Message from [facility], [place] sent at 01/13/2022  2:27 PM EDT ----- I had sent her PMD her lab result for his thoughts/recommendations for her.  Can you please reach out to her to see if they have contacted her?  If not, please advise her to call the PMD to further evaluate her persistent low magnesium. Lets get a repeat level ordered for next week when she can if she has not heard from her PMD office yet

## 2022-01-20 ENCOUNTER — Ambulatory Visit (INDEPENDENT_AMBULATORY_CARE_PROVIDER_SITE_OTHER): Payer: Medicare Other

## 2022-01-20 DIAGNOSIS — I255 Ischemic cardiomyopathy: Secondary | ICD-10-CM

## 2022-01-20 LAB — CUP PACEART REMOTE DEVICE CHECK
Battery Remaining Longevity: 107 mo
Battery Voltage: 3.05 V
Brady Statistic AP VP Percent: 99.71 %
Brady Statistic AP VS Percent: 0.05 %
Brady Statistic AS VP Percent: 0.22 %
Brady Statistic AS VS Percent: 0.02 %
Brady Statistic RA Percent Paced: 91.55 %
Brady Statistic RV Percent Paced: 98.92 %
Date Time Interrogation Session: 20230613020545
Implantable Lead Implant Date: 20221212
Implantable Lead Implant Date: 20221212
Implantable Lead Implant Date: 20221212
Implantable Lead Location: 753858
Implantable Lead Location: 753859
Implantable Lead Location: 753860
Implantable Lead Model: 3830
Implantable Lead Model: 4798
Implantable Lead Model: 5076
Implantable Pulse Generator Implant Date: 20221212
Lead Channel Impedance Value: 323 Ohm
Lead Channel Impedance Value: 342 Ohm
Lead Channel Impedance Value: 361 Ohm
Lead Channel Impedance Value: 380 Ohm
Lead Channel Impedance Value: 418 Ohm
Lead Channel Impedance Value: 456 Ohm
Lead Channel Impedance Value: 456 Ohm
Lead Channel Impedance Value: 475 Ohm
Lead Channel Impedance Value: 608 Ohm
Lead Channel Impedance Value: 627 Ohm
Lead Channel Impedance Value: 627 Ohm
Lead Channel Impedance Value: 646 Ohm
Lead Channel Impedance Value: 665 Ohm
Lead Channel Impedance Value: 684 Ohm
Lead Channel Pacing Threshold Amplitude: 0.5 V
Lead Channel Pacing Threshold Amplitude: 0.75 V
Lead Channel Pacing Threshold Amplitude: 1 V
Lead Channel Pacing Threshold Pulse Width: 0.4 ms
Lead Channel Pacing Threshold Pulse Width: 0.4 ms
Lead Channel Pacing Threshold Pulse Width: 0.6 ms
Lead Channel Sensing Intrinsic Amplitude: 0.75 mV
Lead Channel Sensing Intrinsic Amplitude: 0.75 mV
Lead Channel Sensing Intrinsic Amplitude: 21.625 mV
Lead Channel Sensing Intrinsic Amplitude: 21.625 mV
Lead Channel Setting Pacing Amplitude: 1.75 V
Lead Channel Setting Pacing Amplitude: 2.5 V
Lead Channel Setting Pacing Amplitude: 2.5 V
Lead Channel Setting Pacing Pulse Width: 0.4 ms
Lead Channel Setting Pacing Pulse Width: 0.6 ms
Lead Channel Setting Sensing Sensitivity: 0.9 mV

## 2022-01-23 DIAGNOSIS — Z794 Long term (current) use of insulin: Secondary | ICD-10-CM | POA: Diagnosis not present

## 2022-01-23 DIAGNOSIS — I4891 Unspecified atrial fibrillation: Secondary | ICD-10-CM | POA: Diagnosis not present

## 2022-01-23 DIAGNOSIS — M810 Age-related osteoporosis without current pathological fracture: Secondary | ICD-10-CM | POA: Diagnosis not present

## 2022-01-23 DIAGNOSIS — I10 Essential (primary) hypertension: Secondary | ICD-10-CM | POA: Diagnosis not present

## 2022-01-23 DIAGNOSIS — I255 Ischemic cardiomyopathy: Secondary | ICD-10-CM | POA: Diagnosis not present

## 2022-01-23 DIAGNOSIS — E785 Hyperlipidemia, unspecified: Secondary | ICD-10-CM | POA: Diagnosis not present

## 2022-01-23 DIAGNOSIS — I2510 Atherosclerotic heart disease of native coronary artery without angina pectoris: Secondary | ICD-10-CM | POA: Diagnosis not present

## 2022-01-23 DIAGNOSIS — I6523 Occlusion and stenosis of bilateral carotid arteries: Secondary | ICD-10-CM | POA: Diagnosis not present

## 2022-01-23 DIAGNOSIS — D696 Thrombocytopenia, unspecified: Secondary | ICD-10-CM | POA: Diagnosis not present

## 2022-01-23 DIAGNOSIS — E1140 Type 2 diabetes mellitus with diabetic neuropathy, unspecified: Secondary | ICD-10-CM | POA: Diagnosis not present

## 2022-01-23 DIAGNOSIS — I700 Atherosclerosis of aorta: Secondary | ICD-10-CM | POA: Diagnosis not present

## 2022-01-23 DIAGNOSIS — E042 Nontoxic multinodular goiter: Secondary | ICD-10-CM | POA: Diagnosis not present

## 2022-01-26 ENCOUNTER — Ambulatory Visit (HOSPITAL_COMMUNITY): Payer: Medicare Other | Attending: Internal Medicine

## 2022-01-26 DIAGNOSIS — I255 Ischemic cardiomyopathy: Secondary | ICD-10-CM | POA: Insufficient documentation

## 2022-01-26 DIAGNOSIS — R0600 Dyspnea, unspecified: Secondary | ICD-10-CM | POA: Insufficient documentation

## 2022-01-26 LAB — ECHOCARDIOGRAM LIMITED
Area-P 1/2: 5.09 cm2
MV M vel: 5.65 m/s
MV Peak grad: 127.7 mmHg
S' Lateral: 3.8 cm

## 2022-01-26 MED ORDER — PERFLUTREN LIPID MICROSPHERE
1.0000 mL | INTRAVENOUS | Status: AC | PRN
  Administered 2022-01-26: 2 mL via INTRAVENOUS

## 2022-02-17 ENCOUNTER — Encounter: Payer: Self-pay | Admitting: Internal Medicine

## 2022-02-17 ENCOUNTER — Ambulatory Visit (INDEPENDENT_AMBULATORY_CARE_PROVIDER_SITE_OTHER): Payer: Medicare Other | Admitting: Internal Medicine

## 2022-02-17 VITALS — BP 134/60 | HR 78 | Ht 62.0 in | Wt 110.0 lb

## 2022-02-17 DIAGNOSIS — I480 Paroxysmal atrial fibrillation: Secondary | ICD-10-CM | POA: Diagnosis not present

## 2022-02-17 DIAGNOSIS — I5022 Chronic systolic (congestive) heart failure: Secondary | ICD-10-CM

## 2022-02-17 DIAGNOSIS — I447 Left bundle-branch block, unspecified: Secondary | ICD-10-CM | POA: Diagnosis not present

## 2022-02-17 DIAGNOSIS — I255 Ischemic cardiomyopathy: Secondary | ICD-10-CM | POA: Diagnosis not present

## 2022-02-17 DIAGNOSIS — Z950 Presence of cardiac pacemaker: Secondary | ICD-10-CM | POA: Diagnosis not present

## 2022-02-17 MED ORDER — AMIODARONE HCL 200 MG PO TABS: ORAL_TABLET | ORAL | 2 refills | Status: DC

## 2022-02-17 NOTE — Progress Notes (Signed)
HPI Mrs. Onstott returns today after undergoing biv PM insertion. She is a pleasant 88 yo woman with a h/o CAD, s/p MI, s/p PCI, s/p CABG and LBBB who developed worsening CHF symptoms and underwent biv PPM insertion 8 months ago. She did well initially but about a month ago developed atrial fib with a RVR. Since then she has developed worsening CHF symptoms. She does not have palpitations and could not tell that she was in atrial fib. She was started on amiodarone and present for additonal evaluation. She is better and her fluid index is much improved. No palpitations. She is maintaining NSR. Allergies  Allergen Reactions   Hydrochlorothiazide     Other reaction(s): hyponatremia     Current Outpatient Medications  Medication Sig Dispense Refill   amiodarone (PACERONE) 200 MG tablet Take 1 tablet (200 mg total) by mouth daily. 90 tablet 2   apixaban (ELIQUIS) 2.5 MG TABS tablet Take 1 tablet (2.5 mg total) by mouth 2 (two) times daily. 60 tablet 3   atorvastatin (LIPITOR) 40 MG tablet TAKE 1 TABLET DAILY 90 tablet 3   B-D INS SYR ULTRAFINE 1CC/31G 31G X 5/16" 1 ML MISC      BD PEN NEEDLE NANO U/F 32G X 4 MM MISC Inject 1 each into the skin as directed. With insulin  6   Calcium Carb-Cholecalciferol (CALCIUM 600 + D PO) Take 1 tablet by mouth 2 (two) times daily.     Coenzyme Q10 (COQ10) 200 MG CAPS Take 200 mg by mouth every evening.     ezetimibe (ZETIA) 10 MG tablet TAKE 1 TABLET DAILY 90 tablet 3   furosemide (LASIX) 20 MG tablet Take 1 tablet (20 mg total) by mouth daily. 90 tablet 0   insulin aspart (NOVOLOG FLEXPEN) 100 UNIT/ML FlexPen Inject 6 Units into the skin 3 (three) times daily with meals.     insulin degludec (TRESIBA FLEXTOUCH) 100 UNIT/ML FlexTouch Pen Inject 11 Units into the skin daily.     isosorbide mononitrate (IMDUR) 60 MG 24 hr tablet TAKE 1 TABLET BY MOUTH EVERY DAY 90 tablet 1   magnesium oxide (MAG-OX) 400 MG tablet Take 1 tablet (400 mg total) by mouth 3  (three) times daily. 270 tablet 1   metoprolol succinate (TOPROL XL) 25 MG 24 hr tablet Take 1 tablet (25 mg total) by mouth daily. (Patient taking differently: Take 25 mg by mouth at bedtime.) 30 tablet 11   Multiple Vitamin (MULTIVITAMIN) tablet Take 1 tablet by mouth every morning.      nitroGLYCERIN (NITROSTAT) 0.4 MG SL tablet Place 1 tablet (0.4 mg total) under the tongue every 5 (five) minutes as needed for chest pain. 25 tablet 3   ONE TOUCH ULTRA TEST test strip 2 (two) times daily. test blood sugar  5   ONETOUCH DELICA LANCETS 31V MISC CHECK BLOOD SUGARS 2 TIMES A DAY  5   Polyethyl Glycol-Propyl Glycol (SYSTANE OP) Apply 1 drop to eye 2 (two) times daily as needed (dry/scratchy eyes).     sacubitril-valsartan (ENTRESTO) 24-26 MG Take 1 tablet by mouth 2 (two) times daily. 60 tablet 11   sitaGLIPtin-metformin (JANUMET) 50-1000 MG tablet Take 1 tablet by mouth 2 (two) times daily with a meal.     vitamin C (ASCORBIC ACID) 500 MG tablet Take 500 mg by mouth every evening.      No current facility-administered medications for this visit.     Past Medical History:  Diagnosis Date  Anxiety    Arthritis    Carotid bruit    LEFT   Coronary artery disease    STATUS POST PCI AND LATER CABG   Diabetes mellitus    Dyslipidemia    Dysrhythmia    hx of palpitations and PVC's    History of chicken pox    History of echocardiogram    Echo 2022/09/22: mild LVH, EF 55-60, no RWMA, Gr 1 DD, MAC, mild MR, mild LAE, mild TR, PASP 31   Hypertension    Measles    hx of    Mumps    hx of    Myocardial infarction ([facility])    PAF (paroxysmal atrial fibrillation) ([place]) 10/22/2021   Palpitations    PVC's (premature ventricular contractions)     ROS:   All systems reviewed and negative except as noted in the HPI.   Past Surgical History:  Procedure Laterality Date   BIV PACEMAKER INSERTION CRT-P N/A 07/21/2021   Procedure: BIV PACEMAKER INSERTION CRT-P;  Surgeon: Evans Lance, MD;  Location:  [place] [facility];  Service: Cardiovascular;  Laterality: N/A;   CARDIAC CATHETERIZATION  06/06/97   IT REVEALA MILD TO MODERATE LEFT VENTRICULAR SYSTOLIC DYSFUNCTION  WITH EF OF 40%. THERE IS PERSISTENT AKINESIS OF THE INFERIOR WALL. THERE IS MILD REGURGITATION PRESENT. THERE IS MILD HYPOKINESIS OF THE ANTERIOR  AND APEX WALL   CARDIAC CATHETERIZATION N/A 07/27/2016   Procedure: Left Heart Cath and Cors/Grafts Angiography;  Surgeon: Nelva Bush, MD;  Location: [place] [facility];  Service: Cardiovascular;  Laterality: N/A;   CARDIOVERSION N/A 12/17/2021   Procedure: CARDIOVERSION;  Surgeon: Jerline Pain, MD;  Location: [place] [facility];  Service: Cardiovascular;  Laterality: N/A;   CORONARY ARTERY BYPASS GRAFT     TONSILLECTOMY     TOTAL HIP ARTHROPLASTY Right 03/12/2015   Procedure: RIGHT TOTAL HIP ARTHROPLASTY ANTERIOR APPROACH;  Surgeon: Paralee Cancel, MD;  Location: [facility];  Service: Orthopedics;  Laterality: Right;     Family History  Problem Relation Age of Onset   Heart attack Mother    Stroke Father    Cancer Father      Social History   Socioeconomic History   Marital status: Widowed    Spouse name: Not on file   Number of children: Not on file   Years of education: Not on file   Highest education level: Not on file  Occupational History   Not on file  Tobacco Use   Smoking status: Never   Smokeless tobacco: Never   Tobacco comments:    Never smoke 10/23/21  Vaping Use   Vaping Use: Never used  Substance and Sexual Activity   Alcohol use: No   Drug use: No   Sexual activity: Not on file  Other Topics Concern   Not on file  Social History Narrative   Not on file   Social Determinants of Health   Financial Resource Strain: Not on file  Food Insecurity: Not on file  Transportation Needs: Not on file  Physical Activity: Not on file  Stress: Not on file  Social Connections: Not on file  Intimate Partner Violence: Not on file     BP 134/60   Pulse 78    Ht _0  (1.575 m)   Wt 110 lb (49.9 kg)   SpO2 96%   BMI 20.12 kg/m   Physical Exam:  elderly appearing NAD HEENT: Unremarkable Neck:  No JVD, no thyromegally Lymphatics:  No adenopathy Back:  No  CVA tenderness Lungs:  Clear with no wheezes HEART:  Regular rate rhythm, no murmurs, no rubs, no clicks Abd:  soft, positive bowel sounds, no organomegally, no rebound, no guarding Ext:  2 plus pulses, no edema, no cyanosis, no clubbing Skin:  No rashes no nodules Neuro:  CN II through XII intact, motor grossly intact  EKG - nsr with ventricular pacing  DEVICE  Normal device function.  See PaceArt for details.   Assess/Plan:  PAF - she is maintaining NSR. She will continue her current meds including amiodarone but I asked her to reduce her dose to 200 mg daily, none on Sunday. PPM -her medtronic DDD PM is working normally. Fatigue and weakness - improved. No change in her other meds. I encouraged her to increase her activity.  Carotid artery disease - she appears asymptomatic. Continue her current meds.  Cynthia Overlie Cabell Lazenby,MD

## 2022-02-17 NOTE — Patient Instructions (Addendum)
Medication Instructions:  Your physician has recommended you make the following change in your medication:    REDUCE your amiodarone 200 mg-  Take one tablet by mouth daily Monday through Saturday.  Do NOT take on Sunday.  Labwork: None ordered.  Testing/Procedures: None ordered.  Follow-Up: Your physician wants you to follow-up in: one year with Cristopher Peru, MD or one of the following Advanced Practice Providers on your designated Care Team:   Tommye Standard, [place] Legrand Como "Jonni Sanger" Chalmers Cater, [place]  Remote monitoring is used to monitor your Pacemaker from home. This monitoring reduces the number of office visits required to check your device to one time per year. It allows [place] to keep an eye on the functioning of your device to ensure it is working properly. You are scheduled for a device check from home on 04/21/2022. You may send your transmission at any time that day. If you have a wireless device, the transmission will be sent automatically. After your physician reviews your transmission, you will receive a postcard with your next transmission date.  Any Other Special Instructions Will Be Listed Below (If Applicable).  If you need a refill on your cardiac medications before your next appointment, please call your pharmacy.   Important Information About Sugar

## 2022-02-19 LAB — CUP PACEART INCLINIC DEVICE CHECK
Battery Remaining Longevity: 103 mo
Battery Voltage: 3.03 V
Brady Statistic AP VP Percent: 99.28 %
Brady Statistic AP VS Percent: 0.05 %
Brady Statistic AS VP Percent: 0.65 %
Brady Statistic AS VS Percent: 0.02 %
Brady Statistic RA Percent Paced: 92.69 %
Brady Statistic RV Percent Paced: 99.08 %
Date Time Interrogation Session: 20230711150100
Implantable Lead Implant Date: 20221212
Implantable Lead Implant Date: 20221212
Implantable Lead Implant Date: 20221212
Implantable Lead Location: 753858
Implantable Lead Location: 753859
Implantable Lead Location: 753860
Implantable Lead Model: 3830
Implantable Lead Model: 4798
Implantable Lead Model: 5076
Implantable Pulse Generator Implant Date: 20221212
Lead Channel Impedance Value: 304 Ohm
Lead Channel Impedance Value: 323 Ohm
Lead Channel Impedance Value: 361 Ohm
Lead Channel Impedance Value: 361 Ohm
Lead Channel Impedance Value: 380 Ohm
Lead Channel Impedance Value: 437 Ohm
Lead Channel Impedance Value: 437 Ohm
Lead Channel Impedance Value: 456 Ohm
Lead Channel Impedance Value: 589 Ohm
Lead Channel Impedance Value: 589 Ohm
Lead Channel Impedance Value: 608 Ohm
Lead Channel Impedance Value: 608 Ohm
Lead Channel Impedance Value: 627 Ohm
Lead Channel Impedance Value: 665 Ohm
Lead Channel Pacing Threshold Amplitude: 0.5 V
Lead Channel Pacing Threshold Amplitude: 1 V
Lead Channel Pacing Threshold Amplitude: 1.125 V
Lead Channel Pacing Threshold Pulse Width: 0.4 ms
Lead Channel Pacing Threshold Pulse Width: 0.4 ms
Lead Channel Pacing Threshold Pulse Width: 0.6 ms
Lead Channel Sensing Intrinsic Amplitude: 1 mV
Lead Channel Sensing Intrinsic Amplitude: 1.125 mV
Lead Channel Sensing Intrinsic Amplitude: 19.125 mV
Lead Channel Sensing Intrinsic Amplitude: 23.375 mV
Lead Channel Setting Pacing Amplitude: 2 V
Lead Channel Setting Pacing Amplitude: 2.5 V
Lead Channel Setting Pacing Amplitude: 2.5 V
Lead Channel Setting Pacing Pulse Width: 0.4 ms
Lead Channel Setting Pacing Pulse Width: 0.6 ms
Lead Channel Setting Sensing Sensitivity: 0.9 mV

## 2022-03-02 ENCOUNTER — Encounter: Payer: Self-pay | Admitting: Cardiovascular Disease

## 2022-03-02 NOTE — Progress Notes (Unsigned)
Cardiology Office Note   Date:  03/03/2022   ID:  Cynthia, [name] [birth year], MRN [id number]  PCP:  Reynold Bowen, MD  Cardiologist:   Mertie Moores, MD   Chief Complaint  Patient presents with   Coronary Artery Disease   1. Coronary artery disease-status post PCI( April , 1998)  and CABG ( Nov. 1998) 2. Dyslipidemia 3. Diabetes mellitus 4. Left carotid bruit 5. Right hip arthritis     Cynthia [name] is a 88 y.o.  y.o. female with a history of coronary artery disease. She status post PCI and then later had coronary artery bypass grafting. She also has a history of dyslipidemia, diabetes mellitus, and a left carotid bruit.  She has done well from a cardiac standpoint.  She has not been exercising as much as she has in the past.  She complains of generalized fatigue.  She stays busy doing chores.  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  Cynthia [name] 9, 2014:  Cynthia [name] is dong well.  She is getting over several viral infections over the past few months. No CP.    Dec. 22,2014:  Cynthia [name] has had a rough summer.  Also has had a cold.  Her husband has been at [facility] since early Sept.   No cardiac issues.   Cynthia [name] 1, 2015:  Cynthia [name] is doing ok.  She has lost lots of weight over the past year or so and now is gaining some weight back.    Her has an abdominal bruit.  Renal artery duplex study was normal.   She has started insulin therapy.    Dec. 1, 2015:  Cynthia [name] is an 88 yo who I follow for CAD, dyslipidema.   She has had some BP variablility.   No CP,   Has not been exercising.  Has hip / back problems.   Her husband passed away this past 2023-01-29.    Cynthia [name] 3, 2016:  Cynthia [name] is a 88 y.o. female who presents for  Follow up of her CAD and HTN. Having lots of pain this am - as a result , her BP is high  May need to have hip surgery    Dec. 2, 2016:  Doing well.  No cardiac issues. Had her R hip replacement.  [place] and broke her left arm while using her  walker.   Bp and HR are well controlled.    Cynthia [name] 1, 2017: Doing well.  No CP or dyspnea. Does not have the endurance that she used to have   Dec. 14, 2017  Having more DOE and chest pressure with exertion. Similar to prior to her CABG  Has not tried a SL NTG  Her symptoms started 3 weeks ago. Now she cannot do any exercise before she gets some chest tightness.   Jan. 25 2018: She presented to the office with angina in mid December. She was sent to the hospital and had a heart catheterization. She was found to have an ostial occlusion of the left circumflex artery. The LIMA to LAD was patent but the IMA was atretic.[name] She has a proximal/mid LAD stent that is patent. There is a patent saphenous vein graft to the obtuse marginal artery and a patent saphenous vein graft to the second diagonal. The saphenous vein graft to the right coronary artery is occluded.  She had moderate left ventricle systolic function by cath  with an EF of 35-40%. She had inferior apical akinesis.  Echocardiogram revealed normal left ventricle systolic function. She has mild tricuspid regurgitation and mild mitral regurgitation.  She seems to be feeling better.    No further episodes of CP or dyspnea.    Aug. 17, 2018:   . Has some rare episodes of chest tightness ( especially when carrying heavy objects )   October 22, 2017: He is seen back today for follow-up of her coronary artery disease, dyslipidemia, and peripheral vascular disease. She has noticed some chest pressure if she waks too fast  - especialy if she is carrying something .   Last cath was in Dec. 2017.   She thinks her symptoms have worsened since that time  Was on Valsartan - was recalled,    Cynthia [name] 17, 2019:   Cynthia [name] is seen today for follow-up of her coronary artery disease, diabetes mellitus and hyperlipidemia. Is now on Telmasartan  Brought her BP log,  Most readings are good  No cp or dyspnea   Dec 15, 2019:  Cynthia [name] is  seen today for  follow-up visit. I saw her last year as a telemedicine visit. Brought her BP log from home .   Her BP looks better than the BP here - did not take her meds this am .  Has had both [place] covid vaccines. Has difficulty managing her diabetes   February 20, 2021: Cynthia [name] is seen today for follow up visit  Hx of HTN, CAD No CP , Stays active ,  still eats some salt  DM is well controlled    Sept. 7, 2022: Cynthia [name] is seen today for follow up visit  Hx of HTN, CAD, LBBB  She was seen in July with worsening dyspnea  A recent echo showed EF 30-35%. Amlodipine was stopped Coreg 3.125 BID was started , also on Micardis - consider Entresto    consider Chip Boer or Jardiance  March 03, 2022 Cynthia [name] is seen for follow up of her HTN, CAD, LBBB , CHF Echo from Cynthia [name] 19, 2023 shows severe LV dysfunction with EF 30-35%  Had CRT pacer placed in Dec. 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   No evidence of thrombus  Moderate - severe TR Severe MR The MR has worsened since previous ehco in Nov. 2022        Past Medical History:  Diagnosis Date   Anxiety    Arthritis    Carotid bruit    LEFT   Coronary artery disease    STATUS POST PCI AND LATER CABG   Diabetes mellitus    Dyslipidemia    Dysrhythmia    hx of palpitations and PVC's    History of chicken pox    History of echocardiogram    Echo 08/28/22: mild LVH, EF 55-60, no RWMA, Gr 1 DD, MAC, mild MR, mild LAE, mild TR, PASP 31   Hypertension    Measles    hx of    Mumps    hx of    Myocardial infarction ([place])    PAF (paroxysmal atrial fibrillation) ([place]) 10/22/2021   Palpitations    PVC's (premature ventricular contractions)     Past Surgical History:  Procedure Laterality Date   BIV PACEMAKER INSERTION CRT-P N/A 07/21/2021   Procedure: BIV PACEMAKER INSERTION CRT-P;  Surgeon: Evans Lance, MD;  Location: [facility];   Service: Cardiovascular;  Laterality: N/A;   CARDIAC  CATHETERIZATION  06/06/97   IT REVEALA MILD TO MODERATE LEFT VENTRICULAR SYSTOLIC DYSFUNCTION  WITH EF OF 40%. THERE IS PERSISTENT AKINESIS OF THE INFERIOR WALL. THERE IS MILD REGURGITATION PRESENT. THERE IS MILD HYPOKINESIS OF THE ANTERIOR  AND APEX WALL   CARDIAC CATHETERIZATION N/A 07/27/2016   Procedure: Left Heart Cath and Cors/Grafts Angiography;  Surgeon: Nelva Bush, MD;  Location: [facility];  Service: Cardiovascular;  Laterality: N/A;   CARDIOVERSION N/A 12/17/2021   Procedure: CARDIOVERSION;  Surgeon: Jerline Pain, MD;  Location: [facility];  Service: Cardiovascular;  Laterality: N/A;   CORONARY ARTERY BYPASS GRAFT     TONSILLECTOMY     TOTAL HIP ARTHROPLASTY Right 03/12/2015   Procedure: RIGHT TOTAL HIP ARTHROPLASTY ANTERIOR APPROACH;  Surgeon: Paralee Cancel, MD;  Location: [facility];  Service: Orthopedics;  Laterality: Right;     Current Outpatient Medications  Medication Sig Dispense Refill   amiodarone (PACERONE) 200 MG tablet Take one tablet by mouth daily Monday through Saturday.  Do NOT take on Sunday. (Patient taking differently: Take 100 mg by mouth daily.) 90 tablet 2   apixaban (ELIQUIS) 2.5 MG TABS tablet Take 1 tablet (2.5 mg total) by mouth 2 (two) times daily. 60 tablet 3   atorvastatin (LIPITOR) 40 MG tablet TAKE 1 TABLET DAILY 90 tablet 3   B-D INS SYR ULTRAFINE 1CC/31G 31G X 5/16" 1 ML MISC      BD PEN NEEDLE NANO U/F 32G X 4 MM MISC Inject 1 each into the skin as directed. With insulin  6   Calcium Carb-Cholecalciferol (CALCIUM 600 + D PO) Take 1 tablet by mouth 2 (two) times daily.     Coenzyme Q10 (COQ10) 200 MG CAPS Take 200 mg by mouth every evening.     ezetimibe (ZETIA) 10 MG tablet TAKE 1 TABLET DAILY 90 tablet 3   insulin aspart (NOVOLOG FLEXPEN) 100 UNIT/ML FlexPen Inject 6 Units into the skin 3 (three) times daily with meals.     insulin degludec (TRESIBA FLEXTOUCH) 100 UNIT/ML FlexTouch  Pen Inject 11 Units into the skin daily.     isosorbide mononitrate (IMDUR) 60 MG 24 hr tablet TAKE 1 TABLET BY MOUTH EVERY DAY 90 tablet 1   magnesium oxide (MAG-OX) 400 MG tablet Take 1 tablet (400 mg total) by mouth 3 (three) times daily. 270 tablet 1   metoprolol succinate (TOPROL XL) 25 MG 24 hr tablet Take 1 tablet (25 mg total) by mouth daily. (Patient taking differently: Take 25 mg by mouth at bedtime.) 30 tablet 11   Multiple Vitamin (MULTIVITAMIN) tablet Take 1 tablet by mouth every morning.      nitroGLYCERIN (NITROSTAT) 0.4 MG SL tablet Place 1 tablet (0.4 mg total) under the tongue every 5 (five) minutes as needed for chest pain. 25 tablet 3   ONE TOUCH ULTRA TEST test strip 2 (two) times daily. test blood sugar  5   ONETOUCH DELICA LANCETS 62Z MISC CHECK BLOOD SUGARS 2 TIMES A DAY  5   Polyethyl Glycol-Propyl Glycol (SYSTANE OP) Apply 1 drop to eye 2 (two) times daily as needed (dry/scratchy eyes).     sacubitril-valsartan (ENTRESTO) 49-51 MG Take 1 tablet by mouth 2 (two) times daily. 180 tablet 3   sitaGLIPtin-metformin (JANUMET) 50-1000 MG tablet Take 1 tablet by mouth 2 (two) times daily with a meal.     vitamin C (ASCORBIC ACID) 500 MG tablet Take 500 mg by mouth every evening.      furosemide (LASIX) 20  MG tablet Take 1 tablet (20 mg total) by mouth daily. 90 tablet 0   No current facility-administered medications for this visit.    Allergies:   Hydrochlorothiazide    Social History:  The patient  reports that she has never smoked. She has never used smokeless tobacco. She reports that she does not drink alcohol and does not use drugs.   Family History:  The patient's family history includes Cancer in her father; Heart attack in her mother; Stroke in her father.    ROS: Noted in current history, all other systems are negative.  Physical Exam: Blood pressure (!) 148/76, pulse 82, height _0  (1.575 m), weight 109 lb 9.6 oz (49.7 kg), SpO2 98 %.  GEN:  Well nourished,  well developed in no acute distress HEENT: Normal NECK: No JVD; No carotid bruits LYMPHATICS: No lymphadenopathy CARDIAC: RRR , no murmurs, rubs, gallops RESPIRATORY:  Clear to auscultation without rales, wheezing or rhonchi  ABDOMEN: Soft, non-tender, non-distended MUSCULOSKELETAL:  No edema; No deformity  SKIN: Warm and dry NEUROLOGIC:  Alert and oriented x 3     EKG:       Recent Labs: 12/12/2021: Hemoglobin 11.8; Platelets 120 12/25/2021: ALT 17; BUN 8; Creatinine, Ser 0.66; Potassium 4.1; Sodium 132; TSH 3.630 01/07/2022: Magnesium 1.3    Lipid Panel    Component Value Date/Time   CHOL 103 03/17/2021 0758   TRIG 50 03/17/2021 0758   HDL 49 03/17/2021 0758   CHOLHDL 2.1 03/17/2021 0758   CHOLHDL 2.5 01/09/2016 0952   VLDL 8 01/09/2016 0952   LDLCALC 42 03/17/2021 0758      Wt Readings from Last 3 Encounters:  03/03/22 109 lb 9.6 oz (49.7 kg)  02/17/22 110 lb (49.9 kg)  01/07/22 110 lb 9.6 oz (50.2 kg)      Other studies Reviewed: Additional studies/ records that were reviewed today include: . Review of the above records demonstrates:    ASSESSMENT AND PLAN:  1. Coronary artery disease-status post PCI( April , 1998)  and CABG ( Nov. 1998) -  Her last heart catheterization was in December, 2017 which revealed an atretic LIMA.  Her native LAD has mild nonobstructive disease and has stents.  She has 35% stenosis in the saphenous vein graft to the second diagonal.  No angian .    She may need a repeat cath to sort our her worsening LV function    2.   Chronic combined CHF:   has severe LV dysfunction .    She initially responded to the CRT pacer.   Is on Entresto 24-26 BID   Will have her see the CHF clinic for further  evaluation  Consider adding spiro next visit   3. Dyslipidemia -    Last LDL is 50    3.  Hypertension:   BP is mildly elevated ,  entresto has been increased    4.  Left carotid bruit: .-  5.  LBBB :     s/p BiV pacer   The following  changes have been made:  no change  Labs/ tests ordered today include:   Orders Placed This Encounter  Procedures   AMB referral to CHF clinic      Disposition:    Mertie Moores, MD  03/03/2022 6:19 PM    [address], [place], [place]  [address] Phone: [phone]; Fax: [phone]

## 2022-03-03 ENCOUNTER — Encounter: Payer: Self-pay | Admitting: Cardiovascular Disease

## 2022-03-03 ENCOUNTER — Ambulatory Visit (INDEPENDENT_AMBULATORY_CARE_PROVIDER_SITE_OTHER): Payer: Medicare Other | Admitting: Cardiovascular Disease

## 2022-03-03 VITALS — BP 148/76 | HR 82 | Ht 62.0 in | Wt 109.6 lb

## 2022-03-03 DIAGNOSIS — I25119 Atherosclerotic heart disease of native coronary artery with unspecified angina pectoris: Secondary | ICD-10-CM | POA: Diagnosis not present

## 2022-03-03 DIAGNOSIS — I447 Left bundle-branch block, unspecified: Secondary | ICD-10-CM | POA: Diagnosis not present

## 2022-03-03 DIAGNOSIS — E785 Hyperlipidemia, unspecified: Secondary | ICD-10-CM

## 2022-03-03 DIAGNOSIS — I480 Paroxysmal atrial fibrillation: Secondary | ICD-10-CM | POA: Diagnosis not present

## 2022-03-03 DIAGNOSIS — I5043 Acute on chronic combined systolic (congestive) and diastolic (congestive) heart failure: Secondary | ICD-10-CM

## 2022-03-03 DIAGNOSIS — I255 Ischemic cardiomyopathy: Secondary | ICD-10-CM | POA: Diagnosis not present

## 2022-03-03 MED ORDER — ENTRESTO 49-51 MG PO TABS: 1.0000 | ORAL_TABLET | Freq: Two times a day (BID) | ORAL | 3 refills | Status: AC

## 2022-03-03 MED ORDER — FUROSEMIDE 20 MG PO TABS: 20.0000 mg | ORAL_TABLET | Freq: Every day | ORAL | 0 refills | Status: DC

## 2022-03-03 MED ORDER — SACUBITRIL-VALSARTAN 49-51 MG PO TABS: 1.0000 | ORAL_TABLET | Freq: Two times a day (BID) | ORAL | Status: DC

## 2022-03-03 NOTE — Patient Instructions (Signed)
Medication Instructions:  Your physician has recommended you make the following change in your medication:  Entresto dose increased to 49-'51mg'$  daily  *If you need a refill on your cardiac medications before your next appointment, please call your pharmacy*   Follow-Up: At [facility], you and your health needs are our priority.  As part of our continuing mission to provide you with exceptional heart care, we have created designated Provider Care Teams.  These Care Teams include your primary Cardiologist (physician) and Advanced Practice Providers (APPs -  Physician Assistants and Nurse Practitioners) who all work together to provide you with the care you need, when you need it.   Your next appointment:   6 month(s)  The format for your next appointment:   In Person  Provider:   Mertie Moores, MD    Other Instructions Referral to Heart Failure Clinic

## 2022-03-31 DIAGNOSIS — Z794 Long term (current) use of insulin: Secondary | ICD-10-CM | POA: Diagnosis not present

## 2022-03-31 DIAGNOSIS — I10 Essential (primary) hypertension: Secondary | ICD-10-CM | POA: Diagnosis not present

## 2022-03-31 DIAGNOSIS — E1140 Type 2 diabetes mellitus with diabetic neuropathy, unspecified: Secondary | ICD-10-CM | POA: Diagnosis not present

## 2022-03-31 DIAGNOSIS — I5189 Other ill-defined heart diseases: Secondary | ICD-10-CM | POA: Diagnosis not present

## 2022-03-31 DIAGNOSIS — I2510 Atherosclerotic heart disease of native coronary artery without angina pectoris: Secondary | ICD-10-CM | POA: Diagnosis not present

## 2022-04-01 ENCOUNTER — Other Ambulatory Visit: Payer: Self-pay | Admitting: Cardiovascular Disease

## 2022-04-03 ENCOUNTER — Ambulatory Visit: Payer: Self-pay

## 2022-04-03 NOTE — Patient Outreach (Signed)
  Care Coordination   04/03/2022 Name: Cynthia [name] MRN: [id number] DOB: 10-28-1931   Care Coordination Outreach Attempts:  An unsuccessful telephone outreach was attempted today to offer the patient information about available care coordination services as a benefit of their health plan.   Follow Up Plan:  Additional outreach attempts will be made to offer the patient care coordination information and services.   Encounter Outcome:  No Answer  Care Coordination Interventions Activated:  No   Care Coordination Interventions:  No, not indicated    Daneen Schick, BSW, CDP Social Worker, Certified Dementia Practitioner Care Coordination [phone]

## 2022-04-16 ENCOUNTER — Ambulatory Visit: Payer: Self-pay

## 2022-04-16 NOTE — Patient Outreach (Signed)
  Care Coordination   Initial Visit Note   04/16/2022 Name: Cynthia [name] MRN: [id number] DOB: 1931/09/23  Cynthia [name] is a 88 y.o. year old female who sees [place], Annie Main, MD for primary care. I spoke with  Cynthia [name] by phone today.  What matters to the patients health and wellness today?  I need an appointment with the heart failure clinic    Goals Addressed             This Visit's Progress    Care Coordination Activities       Care Coordination Interventions: SDoH screening performed - no acute resource challenges identified at this time Determined the patient does not have concerns with medication costs at this time Patient states she has so much medication she sometimes gets confused - does endorse using a pill box Education provided on role of [place] with care coordination team - patient agreeable to call with [place] Discussed the patient was seen by her cardiologist on 7/25 and was referred to the heart failure clinic but has yet to receive an appointment Discussed plans for SW to follow up on the status of this referral on behalf of the patient Outbound call placed to the heart failure clinic, spoke with Mackie Pai who will have a team member contact me regarding referral status Collaboration with [place] to advise of interventions and patient desire to engage with care coordination team        SDOH assessments and interventions completed:  Yes  SDOH Interventions Today    Flowsheet Row Most Recent Value  SDOH Interventions   Food Insecurity Interventions Intervention Not Indicated  Housing Interventions Intervention Not Indicated  Transportation Interventions Intervention Not Indicated  Utilities Interventions Intervention Not Indicated        Care Coordination Interventions Activated:  Yes  Care Coordination Interventions:  Yes, provided   Follow up plan: Referral made to RN Care Manager     Encounter Outcome:  Pt. Visit Completed   Daneen Schick, BSW, CDP Social Worker, Certified Dementia Practitioner Care Coordination [phone]

## 2022-04-16 NOTE — Patient Instructions (Signed)
Visit Information  Thank you for taking time to visit with me today. Please don't hesitate to contact me if I can be of assistance to you.   Following are the goals we discussed today:   Goals Addressed             This Visit's Progress    Care Coordination Activities       Care Coordination Interventions: SDoH screening performed - no acute resource challenges identified at this time Determined the patient does not have concerns with medication costs at this time Patient states she has so much medication she sometimes gets confused - does endorse using a pill box Education provided on role of [place] with care coordination team - patient agreeable to call with [place] Discussed the patient was seen by her cardiologist on 7/25 and was referred to the heart failure clinic but has yet to receive an appointment Discussed plans for SW to follow up on the status of this referral on behalf of the patient Outbound call placed to the heart failure clinic, spoke with Mackie Pai who will have a team member contact me regarding referral status Collaboration with [place] to advise of interventions and patient desire to engage with care coordination team         If you are experiencing a Mental Health or [place] or need someone to talk to, please call 1-800-273-TALK (toll free, 24 hour hotline)  Patient verbalizes understanding of instructions and care plan provided today and agrees to view in [place]. Active MyChart status and patient understanding of how to access instructions and care plan via MyChart confirmed with patient.     Please contact me as needed. I will follow up with you once hearing back from the heart failure clinic.  Daneen Schick, BSW, CDP Social Worker, Certified Dementia Practitioner Care Coordination [phone]

## 2022-04-20 ENCOUNTER — Ambulatory Visit: Payer: Self-pay

## 2022-04-20 NOTE — Patient Outreach (Signed)
  Care Coordination   Follow Up Visit Note   04/20/2022 Name: Cynthia [name] MRN: [id number] DOB: 1932-04-27  Cynthia [name] is a 88 y.o. year old female who sees [place], Annie Main, MD for primary care. I  collaborated with patients cardiologist Dr. Acie Fredrickson regarding the status of patients referral placed to the heart failure clinic on 7/25.  What matters to the patients health and wellness today?  To obtain an appointment to the heart failure clinic    Goals Addressed             This Visit's Progress    Care Coordination Activities       Care Coordination Interventions: Performed chart review to note patient has yet to be scheduled with heart failure clinic Outbound call placed to heart failure clinic to follow up on status of patients referral - voice message left requesting a return call Collaboration with Dr. Acie Fredrickson to advise patient is still awaiting scheduling with the heart failure clinic         SDOH assessments and interventions completed:  No     Care Coordination Interventions Activated:  Yes  Care Coordination Interventions:  Yes, provided   Follow up plan:  The care coordination team will continue to follow    Encounter Outcome:  Pt. Visit Completed   Daneen Schick, BSW, CDP Social Worker, Certified Dementia Practitioner Care Coordination [phone]

## 2022-04-21 ENCOUNTER — Ambulatory Visit (INDEPENDENT_AMBULATORY_CARE_PROVIDER_SITE_OTHER): Payer: Medicare Other

## 2022-04-21 DIAGNOSIS — I5022 Chronic systolic (congestive) heart failure: Secondary | ICD-10-CM

## 2022-04-22 ENCOUNTER — Ambulatory Visit: Payer: Self-pay

## 2022-04-22 NOTE — Patient Instructions (Signed)
Visit Information  Thank you for taking time to visit with me today. Please don't hesitate to contact me if I can be of assistance to you.   Following are the goals we discussed today:   Goals Addressed               This Visit's Progress     Patient Stated     I would like more information about my heart failure (pt-stated)        Care Coordination Interventions: Basic overview and discussion of pathophysiology of Heart Failure reviewed Provided education on low sodium diet Reviewed Heart Failure Action Plan in depth and provided written copy Provided education about placing scale on hard, flat surface Advised patient to weigh each morning after emptying bladder Discussed importance of daily weight and advised patient to weigh and record daily Reviewed role of diuretics in prevention of fluid overload and management of heart failure; Discussed the importance of keeping all appointments with provider           Our next appointment is by telephone on 05/20/22 at 0900AM  Please call the care guide team at [phone] if you need to cancel or reschedule your appointment.   If you are experiencing a Mental Health or [place] or need someone to talk to, please call 1-800-273-TALK (toll free, 24 hour hotline)  Patient verbalizes understanding of instructions and care plan provided today and agrees to view in [place]. Active MyChart status and patient understanding of how to access instructions and care plan via MyChart confirmed with patient.     Barb Merino, RN, BSN, CCM Care Management Coordinator [facility] Care Management  Direct Phone: [phone]

## 2022-04-22 NOTE — Patient Outreach (Signed)
  Care Coordination   Initial Visit Note   04/22/2022 Name: Cynthia [name] MRN: [id number] DOB: February 20, 1932  Cynthia [name] is a 88 y.o. year old female who sees [place], Annie Main, MD for primary care. I spoke with  Cynthia [name] by phone today.  What matters to the patients health and wellness today?  Patient would like to learn more about her heart failure.     Goals Addressed               This Visit's Progress     Patient Stated     I would like more information about my heart failure (pt-stated)        Care Coordination Interventions: Basic overview and discussion of pathophysiology of Heart Failure reviewed Provided education on low sodium diet Reviewed Heart Failure Action Plan in depth and provided written copy Provided education about placing scale on hard, flat surface Advised patient to weigh each morning after emptying bladder Discussed importance of daily weight and advised patient to weigh and record daily Reviewed role of diuretics in prevention of fluid overload and management of heart failure; Discussed the importance of keeping all appointments with provider       SDOH assessments and interventions completed:  No     Care Coordination Interventions Activated:  Yes  Care Coordination Interventions:  Yes, provided   Follow up plan: Follow up call scheduled for 05/20/22 '@09'$ :00 AM    Encounter Outcome:  Pt. Visit Completed

## 2022-04-23 LAB — CUP PACEART REMOTE DEVICE CHECK
Battery Remaining Longevity: 99 mo
Battery Voltage: 3.01 V
Brady Statistic AP VP Percent: 99.83 %
Brady Statistic AP VS Percent: 0.06 %
Brady Statistic AS VP Percent: 0.1 %
Brady Statistic AS VS Percent: 0.01 %
Brady Statistic RA Percent Paced: 99.89 %
Brady Statistic RV Percent Paced: 99.93 %
Date Time Interrogation Session: 20230912020640
Implantable Lead Implant Date: 20221212
Implantable Lead Implant Date: 20221212
Implantable Lead Implant Date: 20221212
Implantable Lead Location: 753858
Implantable Lead Location: 753859
Implantable Lead Location: 753860
Implantable Lead Model: 3830
Implantable Lead Model: 4798
Implantable Lead Model: 5076
Implantable Pulse Generator Implant Date: 20221212
Lead Channel Impedance Value: 285 Ohm
Lead Channel Impedance Value: 304 Ohm
Lead Channel Impedance Value: 342 Ohm
Lead Channel Impedance Value: 361 Ohm
Lead Channel Impedance Value: 380 Ohm
Lead Channel Impedance Value: 418 Ohm
Lead Channel Impedance Value: 437 Ohm
Lead Channel Impedance Value: 437 Ohm
Lead Channel Impedance Value: 570 Ohm
Lead Channel Impedance Value: 570 Ohm
Lead Channel Impedance Value: 589 Ohm
Lead Channel Impedance Value: 589 Ohm
Lead Channel Impedance Value: 608 Ohm
Lead Channel Impedance Value: 646 Ohm
Lead Channel Pacing Threshold Amplitude: 0.5 V
Lead Channel Pacing Threshold Amplitude: 1 V
Lead Channel Pacing Threshold Amplitude: 1 V
Lead Channel Pacing Threshold Pulse Width: 0.4 ms
Lead Channel Pacing Threshold Pulse Width: 0.4 ms
Lead Channel Pacing Threshold Pulse Width: 0.6 ms
Lead Channel Sensing Intrinsic Amplitude: 0.875 mV
Lead Channel Sensing Intrinsic Amplitude: 0.875 mV
Lead Channel Sensing Intrinsic Amplitude: 19.625 mV
Lead Channel Sensing Intrinsic Amplitude: 19.625 mV
Lead Channel Setting Pacing Amplitude: 2 V
Lead Channel Setting Pacing Amplitude: 2.5 V
Lead Channel Setting Pacing Amplitude: 2.5 V
Lead Channel Setting Pacing Pulse Width: 0.4 ms
Lead Channel Setting Pacing Pulse Width: 0.6 ms
Lead Channel Setting Sensing Sensitivity: 0.9 mV

## 2022-04-30 ENCOUNTER — Encounter (HOSPITAL_COMMUNITY): Payer: Medicare Other | Admitting: Cardiology

## 2022-05-05 ENCOUNTER — Other Ambulatory Visit (HOSPITAL_COMMUNITY): Payer: Self-pay

## 2022-05-05 ENCOUNTER — Ambulatory Visit (HOSPITAL_COMMUNITY)
Admission: RE | Admit: 2022-05-05 | Discharge: 2022-05-05 | Disposition: A | Discharge status: Home / Self Care | Payer: Medicare Other | Source: Ambulatory Visit | Attending: Cardiology | Admitting: Cardiology

## 2022-05-05 ENCOUNTER — Encounter (HOSPITAL_COMMUNITY): Payer: Self-pay | Admitting: Cardiology

## 2022-05-05 VITALS — BP 120/60 | HR 80 | Wt 106.4 lb

## 2022-05-05 DIAGNOSIS — I5022 Chronic systolic (congestive) heart failure: Secondary | ICD-10-CM | POA: Diagnosis not present

## 2022-05-05 DIAGNOSIS — I480 Paroxysmal atrial fibrillation: Secondary | ICD-10-CM

## 2022-05-05 LAB — COMPREHENSIVE METABOLIC PANEL
ALT: 27 U/L (ref 0–44)
AST: 22 U/L (ref 15–41)
Albumin: 4.1 g/dL (ref 3.5–5.0)
Alkaline Phosphatase: 63 U/L (ref 38–126)
Anion gap: 10 (ref 5–15)
BUN: 8 mg/dL (ref 8–23)
CO2: 29 mmol/L (ref 22–32)
Calcium: 9.3 mg/dL (ref 8.9–10.3)
Chloride: 93 mmol/L — ABNORMAL LOW (ref 98–111)
Creatinine, Ser: 0.6 mg/dL (ref 0.44–1.00)
GFR, Estimated: >60 mL/min (ref >60)
Glucose, Bld: 224 mg/dL — ABNORMAL HIGH (ref 70–99)
Potassium: 3.7 mmol/L (ref 3.5–5.1)
Sodium: 132 mmol/L — ABNORMAL LOW (ref 135–145)
Total Bilirubin: 1.9 mg/dL — ABNORMAL HIGH (ref 0.3–1.2)
Total Protein: 6.7 g/dL (ref 6.5–8.1)

## 2022-05-05 LAB — BRAIN NATRIURETIC PEPTIDE: B Natriuretic Peptide: 1259.3 pg/mL — ABNORMAL HIGH (ref 0.0–100.0)

## 2022-05-05 MED ORDER — DAPAGLIFLOZIN PROPANEDIOL 10 MG PO TABS: 10.0000 mg | ORAL_TABLET | Freq: Every day | ORAL | 6 refills | Status: AC

## 2022-05-05 MED ORDER — SPIRONOLACTONE 25 MG PO TABS: 12.5000 mg | ORAL_TABLET | Freq: Every day | ORAL | 3 refills | Status: DC

## 2022-05-05 NOTE — Patient Instructions (Signed)
Medication Changes:  Start Spironolactone 12.5 mg (1/2 tab) Daily  Start Farxiga 10 mg Daily  Your provider has prescribed Wilder Glade for you. Please be aware the most common side effect of this medication is urinary tract infections and yeast infections. Please practice good hygiene and keep this area clean and dry to help prevent this. If you do begin to have symptoms of these infections, such as difficulty urinating or painful urination,  please let us know.  Lab Work:  Labs done today, your results will be available in MyChart, we will contact you for abnormal readings.  Testing/Procedures:  none  Referrals:  none  Special Instructions // Education:  Do the following things EVERYDAY: Weigh yourself in the morning before breakfast. Write it down and keep it in a log. Take your medicines as prescribed Eat low salt foods--Limit salt (sodium) to 2000 mg per day.  Stay as active as you can everyday Limit all fluids for the day to less than 2 liters   Follow-Up in:   --Please follow up with our heart failure pharmacist in 2-3 weeks  --Your physician recommends that you schedule a follow-up appointment in: 8 weeks    At the [facility], you and your health needs are our priority. We have a designated team specialized in the treatment of Heart Failure. This Care Team includes your primary Heart Failure Specialized Cardiologist (physician), Advanced Practice Providers (APPs- Physician Assistants and Nurse Practitioners), and Pharmacist who all work together to provide you with the care you need, when you need it.   You may see any of the following providers on your designated Care Team at your next follow up:  Dr. Glori Bickers Dr. Loralie Champagne Dr. Roxana Hires, NP Lyda Jester, [place] [facility], [place] Forestine Na, NP Audry Riles, PharmD   Please be sure to bring in all your medications bottles to every appointment.    Need to Contact us:  If you have any questions or concerns before your next appointment please send [place] a message through [place] or call our office at [phone].    TO LEAVE A MESSAGE FOR THE NURSE SELECT OPTION 2, PLEASE LEAVE A MESSAGE INCLUDING: YOUR NAME DATE OF BIRTH CALL BACK NUMBER REASON FOR CALL**this is important as we prioritize the call backs  YOU WILL RECEIVE A CALL BACK THE SAME DAY AS LONG AS YOU CALL BEFORE 4:00 PM

## 2022-05-05 NOTE — Progress Notes (Signed)
ADVANCED HEART FAILURE CLINIC NOTE  Referring Physician: Reynold Bowen, MD  Primary Care: Reynold Bowen, MD Primary Cardiologist: Mertie Moores, MD  HPI: Cynthia [name] is a 88 y.o. female coronary artery disease status post CABG, dyslipidemia, type 2 diabetes, R-hip replacement and systolic heart failure diagnosed in 2022 (LVEF of 30 to 35%) status post CRT-D presenting today to establish care. Cynthia [name] cardiac history dates back to 1998 when she underwent CABG. In 2017, due to decline in functional status and chest pain, she underwent bypass/coronary angiography that demonstrated an atretic LIMA, SVG-RCA and ostial occlusion of the Lcx. In 2022 her LVEF dropped from 60% to 30-35% w/ new LBBB. She underwent CRT-D in 12/22 that initially improved her symptoms significantly. Unfortunately, shortly after she had episodes of atrial fibrillation requiring hospitalization, initiation of amiodarone and cardioversion. Since that time she has done fairly well from a cardiac standpoint, however, continues to feel limited w/ TTE demonstrating persistent reduction in LVEF and moderate to severe MR.   Cynthia [name] reports rarely feeling SOB, rather, she feels mostly limited by her inability to ambulate at a normal pace due to arthritis and her prior hip replacement.  Activity level/exercise tolerance:  Significantly limited mostly due to arthritis/deconditioning Orthopnea:  Sleeps on 1-2 pillows Paroxysmal noctural dyspnea:  No Chest pain/pressure:  No Orthostatic lightheadedness:  No Palpitations:  Resolved Lower extremity edema:  No, miniimal Presyncope/syncope:  No Cough:  No  Past Medical History:  Diagnosis Date   Anxiety    Arthritis    Carotid bruit    LEFT   Coronary artery disease    STATUS POST PCI AND LATER CABG   Diabetes mellitus    Dyslipidemia    Dysrhythmia    hx of palpitations and PVC's    History of chicken pox    History of echocardiogram    Echo 09/23/2022: mild  LVH, EF 55-60, no RWMA, Gr 1 DD, MAC, mild MR, mild LAE, mild TR, PASP 31   Hypertension    Measles    hx of    Mumps    hx of    Myocardial infarction (HCC)    PAF (paroxysmal atrial fibrillation) ([place]) 10/22/2021   Palpitations    PVC's (premature ventricular contractions)     Current Outpatient Medications  Medication Sig Dispense Refill   amiodarone (PACERONE) 200 MG tablet Take 200 mg by mouth daily.     apixaban (ELIQUIS) 2.5 MG TABS tablet Take 1 tablet (2.5 mg total) by mouth 2 (two) times daily. 60 tablet 3   atorvastatin (LIPITOR) 40 MG tablet TAKE 1 TABLET DAILY 90 tablet 3   B-D INS SYR ULTRAFINE 1CC/31G 31G X 5/16" 1 ML MISC      BD PEN NEEDLE NANO U/F 32G X 4 MM MISC Inject 1 each into the skin as directed. With insulin  6   Calcium Carb-Cholecalciferol (CALCIUM 600 + D PO) Take 1 tablet by mouth 2 (two) times daily.     Coenzyme Q10 (COQ10) 200 MG CAPS Take 200 mg by mouth every evening.     dapagliflozin propanediol (FARXIGA) 10 MG TABS tablet Take 1 tablet (10 mg total) by mouth daily before breakfast. 30 tablet 6   ezetimibe (ZETIA) 10 MG tablet TAKE 1 TABLET DAILY 90 tablet 3   furosemide (LASIX) 20 MG tablet Take 1 tablet (20 mg total) by mouth daily. 90 tablet 0   insulin aspart (NOVOLOG FLEXPEN) 100 UNIT/ML FlexPen Inject 6 Units into the skin 3 (  three) times daily with meals.     insulin degludec (TRESIBA FLEXTOUCH) 100 UNIT/ML FlexTouch Pen Inject 11 Units into the skin daily.     isosorbide mononitrate (IMDUR) 60 MG 24 hr tablet TAKE 1 TABLET BY MOUTH EVERY DAY 90 tablet 1   magnesium oxide (MAG-OX) 400 MG tablet Take 1 tablet (400 mg total) by mouth 3 (three) times daily. 270 tablet 1   metoprolol succinate (TOPROL XL) 25 MG 24 hr tablet Take 1 tablet (25 mg total) by mouth daily. 30 tablet 11   Multiple Vitamin (MULTIVITAMIN) tablet Take 1 tablet by mouth every morning.      nitroGLYCERIN (NITROSTAT) 0.4 MG SL tablet Place 1 tablet (0.4 mg total) under the  tongue every 5 (five) minutes as needed for chest pain. 25 tablet 3   ONE TOUCH ULTRA TEST test strip 2 (two) times daily. test blood sugar  5   ONETOUCH DELICA LANCETS 64Q MISC CHECK BLOOD SUGARS 2 TIMES A DAY  5   Polyethyl Glycol-Propyl Glycol (SYSTANE OP) Apply 1 drop to eye 2 (two) times daily as needed (dry/scratchy eyes).     sacubitril-valsartan (ENTRESTO) 49-51 MG Take 1 tablet by mouth 2 (two) times daily. 180 tablet 3   sitaGLIPtin-metformin (JANUMET) 50-1000 MG tablet Take 1 tablet by mouth 2 (two) times daily with a meal.     spironolactone (ALDACTONE) 25 MG tablet Take 0.5 tablets (12.5 mg total) by mouth daily. 15 tablet 3   vitamin C (ASCORBIC ACID) 500 MG tablet Take 500 mg by mouth every evening.      No current facility-administered medications for this encounter.    Allergies  Allergen Reactions   Hydrochlorothiazide     Other reaction(s): hyponatremia      Social History   Socioeconomic History   Marital status: Widowed    Spouse name: Not on file   Number of children: Not on file   Years of education: Not on file   Highest education level: Not on file  Occupational History   Not on file  Tobacco Use   Smoking status: Never   Smokeless tobacco: Never   Tobacco comments:    Never smoke 10/23/21  Vaping Use   Vaping Use: Never used  Substance and Sexual Activity   Alcohol use: No   Drug use: No   Sexual activity: Not on file  Other Topics Concern   Not on file  Social History Narrative   Not on file   Social Determinants of Health   Financial Resource Strain: Not on file  Food Insecurity: No Food Insecurity (04/16/2022)   Hunger Vital Sign    Worried About Running Out of Food in the Last Year: Never true    Ran Out of Food in the Last Year: Never true  Transportation Needs: No Transportation Needs (04/16/2022)   PRAPARE - Hydrologist (Medical): No    Lack of Transportation (Non-Medical): No  Physical Activity: Not on  file  Stress: Not on file  Social Connections: Not on file  Intimate Partner Violence: Not on file      Family History  Problem Relation Age of Onset   Heart attack Mother    Stroke Father    Cancer Father     PHYSICAL EXAM: Vitals:   05/05/22 1005  BP: 120/60  Pulse: 80  SpO2: 97%   GENERAL: elderly WF sitting comfortably, NAD HEENT: Negative for arcus senilis or xanthelasma. There is no scleral icterus.  The mucous membranes are pink and moist.   NECK: Supple, No masses. Normal carotid upstrokes without bruits. No masses or thyromegaly.    CHEST: There are no chest wall deformities. There is no chest wall tenderness. Respirations are unlabored.  Lungs- CTA B/L CARDIAC:  JVP: 9-10 cm H2O         Normal S1, S2  Normal rate with regular rhythm. No murmurs, rubs or gallops.  Pulses are 2+ and symmetrical in upper and lower extremities. no edema.  ABDOMEN: Soft, non-tender, non-distended. There are no masses or hepatomegaly. There are normal bowel sounds.  EXTREMITIES: Warm and well perfused with no cyanosis, clubbing.  LYMPHATIC: No axillary or supraclavicular lymphadenopathy.  NEUROLOGIC: Patient is oriented x3 with no focal or lateralizing neurologic deficits.  PSYCH: Patients affect is appropriate, there is no evidence of anxiety or depression.  SKIN: Warm and dry; no lesions or wounds.   DATA REVIEW  XMI:WOEH  ECHO: 01/26/22: LVEF 30-35%, akinesis of the inferior wall; apex is aneurysmal, mod-severe MR  CATH: 07/27/2016: Significant single-vessel coronary artery disease, with ostial occlusion of LCx. Mild to moderate, non-obstructive CAD involving the ostial LMCA and mid RCA.  Proximal/mid LAD stent is patent with minimal in-stent restenosis. Patent SVG to OM2 and SVG to D2.  SVG to D2 demonstrates 30% proximal graft stenosis. Ostially occluded SVG to RCA/PDA. Atretic LIMA. Mildly elevated left ventricular filling pressure. Moderately reduced left ventricular  contraction with inferior and apical akinesis (LVEF 35-40%).   ASSESSMENT & PLAN:  Stage D systolic heart failure Etiology of HF: Ischemic cardiomyopathy NYHA class / AHA Stage:III, limited mostly by arthritis, R-hip replacement Volume status & Diuretics: Euvolemic to mildly hypervolemic, on lasix 20 daily. Starting spiro/farxiga today so will plan to switch lasix to every other day and prn Vasodilators:Entresto 49/30m BID Beta-Blocker:toprol xl 25 MOZY:YQMGNspiro 12.536mdaily Cardiometabolic:start farxiga 1000BBaily Devices therapies & Valvulopathies: CRT-D with percent pacing Advanced therapies: Not a candidate currently; will consider MitraClip if symptoms do not improve.   2. Moderate to severe mitral regurgitation  - Calcified valve; I do not believe she would be a great candidate for TEER, would require TEE for definitive evaluation. Will continue aggressive medical therapy for time being.   3. Atrial fibrillation  - followed by EP - amiodarone 20064maily, Eliquis 2.5mg65mD, toprol XL 25mg80mly  4. CRT-D  - Most recent report with 99.9% BiV pacing - continues to have episodes of AF  5. CAD s/p CABG - angiography as above; no anginal symptoms at this time.  - will uptitrate GDMT and attempt to adjust imdur in the future  Follow-up:  8 weeks Pharmacy 2-3 weeks   Leldon Steege Advanced Heart Failure Mechanical Circulatory Support

## 2022-05-07 ENCOUNTER — Other Ambulatory Visit: Payer: Self-pay | Admitting: Cardiovascular Disease

## 2022-05-07 DIAGNOSIS — E785 Hyperlipidemia, unspecified: Secondary | ICD-10-CM

## 2022-05-07 NOTE — Progress Notes (Signed)
Remote pacemaker transmission.   

## 2022-05-20 ENCOUNTER — Ambulatory Visit: Payer: Self-pay

## 2022-05-20 NOTE — Patient Outreach (Signed)
  Care Coordination   Follow Up Visit Note   05/20/2022 Name: ARLONE LENHARDT MRN: [id number] DOB: February 23, 1932  Ardis Rowan is a 88 y.o. year old female who sees [place], Annie Main, MD for primary care. I spoke with  Ardis Rowan by phone today.  What matters to the patients health and wellness today?  Patient continues to manage her CHF.     Goals Addressed               This Visit's Progress     Patient Stated     I would like more information about my heart failure (pt-stated)        Care Coordination Interventions: Basic overview and discussion of pathophysiology of Heart Failure reviewed Discussed importance of daily weight and advised patient to weigh and record daily Determined patient completed her follow up with Cardiology as directed Review of patient status, including review of consultant's reports, relevant laboratory and other test results, and medications completed Reviewed role of diuretics in prevention of fluid overload and management of heart failure Reviewed upcoming scheduled pharmacy appointment, instructed patient to take her medications to this appointment; reviewed next Cardiology follow up with Dr.Sabharwale, patient will drive herself to appointments          SDOH assessments and interventions completed:  No     Care Coordination Interventions Activated:  Yes  Care Coordination Interventions:  Yes, provided   Follow up plan: Follow up call scheduled for 07/01/22 '@10'$ :30 AM    Encounter Outcome:  Pt. Visit Completed

## 2022-05-20 NOTE — Patient Instructions (Addendum)
Visit Information  Thank you for taking time to visit with me today. Please don't hesitate to contact me if I can be of assistance to you.   Following are the goals we discussed today:   Goals Addressed               This Visit's Progress     Patient Stated     I would like more information about my heart failure (pt-stated)        Care Coordination Interventions: Basic overview and discussion of pathophysiology of Heart Failure reviewed Discussed importance of daily weight and advised patient to weigh and record daily Determined patient completed her follow up with Cardiology as directed Review of patient status, including review of consultant's reports, relevant laboratory and other test results, and medications completed Reviewed role of diuretics in prevention of fluid overload and management of heart failure Reviewed upcoming scheduled pharmacy appointment, instructed patient to take her medications to this appointment; reviewed next Cardiology follow up with Dr.Sabharwale, patient will drive herself to appointments          Our next appointment is by telephone on 07/01/22 at 1030 AM  Please call the care guide team at [phone] if you need to cancel or reschedule your appointment.   If you are experiencing a Mental Health or [place] or need someone to talk to, please call 1-800-273-TALK (toll free, 24 hour hotline)  Patient verbalizes understanding of instructions and care plan provided today and agrees to view in [place]. Active MyChart status and patient understanding of how to access instructions and care plan via MyChart confirmed with patient.     Barb Merino, RN, BSN, CCM Care Management Coordinator [facility]  Direct Phone: [phone]

## 2022-05-26 ENCOUNTER — Ambulatory Visit (HOSPITAL_COMMUNITY)
Admission: RE | Admit: 2022-05-26 | Discharge: 2022-05-26 | Disposition: A | Discharge status: Home / Self Care | Payer: Medicare Other | Source: Ambulatory Visit | Attending: Cardiology | Admitting: Cardiology

## 2022-05-26 ENCOUNTER — Encounter (HOSPITAL_COMMUNITY): Payer: Self-pay

## 2022-05-26 VITALS — BP 118/66 | HR 61 | Wt 106.2 lb

## 2022-05-26 DIAGNOSIS — Z7984 Long term (current) use of oral hypoglycemic drugs: Secondary | ICD-10-CM | POA: Diagnosis not present

## 2022-05-26 DIAGNOSIS — I5022 Chronic systolic (congestive) heart failure: Secondary | ICD-10-CM | POA: Diagnosis not present

## 2022-05-26 DIAGNOSIS — E785 Hyperlipidemia, unspecified: Secondary | ICD-10-CM | POA: Insufficient documentation

## 2022-05-26 DIAGNOSIS — I340 Nonrheumatic mitral (valve) insufficiency: Secondary | ICD-10-CM | POA: Insufficient documentation

## 2022-05-26 DIAGNOSIS — I4891 Unspecified atrial fibrillation: Secondary | ICD-10-CM | POA: Diagnosis not present

## 2022-05-26 DIAGNOSIS — I447 Left bundle-branch block, unspecified: Secondary | ICD-10-CM | POA: Diagnosis not present

## 2022-05-26 DIAGNOSIS — Z7901 Long term (current) use of anticoagulants: Secondary | ICD-10-CM | POA: Insufficient documentation

## 2022-05-26 DIAGNOSIS — Z951 Presence of aortocoronary bypass graft: Secondary | ICD-10-CM | POA: Diagnosis not present

## 2022-05-26 DIAGNOSIS — E118 Type 2 diabetes mellitus with unspecified complications: Secondary | ICD-10-CM | POA: Diagnosis not present

## 2022-05-26 DIAGNOSIS — I255 Ischemic cardiomyopathy: Secondary | ICD-10-CM | POA: Diagnosis not present

## 2022-05-26 DIAGNOSIS — Z79899 Other long term (current) drug therapy: Secondary | ICD-10-CM | POA: Insufficient documentation

## 2022-05-26 DIAGNOSIS — I2510 Atherosclerotic heart disease of native coronary artery without angina pectoris: Secondary | ICD-10-CM | POA: Insufficient documentation

## 2022-05-26 LAB — BASIC METABOLIC PANEL
Anion gap: 11 (ref 5–15)
BUN: 12 mg/dL (ref 8–23)
CO2: 24 mmol/L (ref 22–32)
Calcium: 9.6 mg/dL (ref 8.9–10.3)
Chloride: 97 mmol/L — ABNORMAL LOW (ref 98–111)
Creatinine, Ser: 0.62 mg/dL (ref 0.44–1.00)
GFR, Estimated: >60 mL/min (ref >60)
Glucose, Bld: 193 mg/dL — ABNORMAL HIGH (ref 70–99)
Potassium: 4.5 mmol/L (ref 3.5–5.1)
Sodium: 132 mmol/L — ABNORMAL LOW (ref 135–145)

## 2022-05-26 LAB — BRAIN NATRIURETIC PEPTIDE: B Natriuretic Peptide: 816.8 pg/mL — ABNORMAL HIGH (ref 0.0–100.0)

## 2022-05-26 MED ORDER — SPIRONOLACTONE 25 MG PO TABS: 25.0000 mg | ORAL_TABLET | Freq: Every day | ORAL | 3 refills | Status: DC

## 2022-05-26 NOTE — Progress Notes (Signed)
Advanced Heart Failure Clinic Note   Referring Physician: Reynold Bowen, MD  Primary Care: Reynold Bowen, MD Primary Cardiologist: Mertie Moores, MD Primary HF: Dr. Daniel Nones  HPI:  Cynthia [name] is a 88 y.o. female coronary artery disease status post CABG, dyslipidemia, type 2 diabetes, R-hip replacement and systolic heart failure diagnosed in 2022 (LVEF of 30 to 35%) status post CRT-D. Cynthia [name]'s cardiac history dates back to 1998 when she underwent CABG. In 2017, due to decline in functional status and chest pain, she underwent bypass/coronary angiography that demonstrated an atretic LIMA, SVG-RCA and ostial occlusion of the Lcx. In 2022 her LVEF dropped from 60% to 30-35% w/ new LBBB. She underwent CRT-D in 07/2021 that initially improved her symptoms significantly. Unfortunately, shortly after she had episodes of atrial fibrillation requiring hospitalization, initiation of amiodarone and cardioversion. Since that time she has done fairly well from a cardiac standpoint, however, continues to feel limited w/ TTE demonstrating persistent reduction in LVEF and moderate to severe MR.   Established care with Dr. Daniel Nones at [facility] clinic on 05/05/2022. Reported rarely feeling SOB, rather, she felt mostly limited by her inability to ambulate at a normal pace due to arthritis and her prior hip replacement. Denied orthopnea/PND, chest pain, palpitation, lightheadedness and LEE.   Today she returns to HF clinic for pharmacist medication titration. At last visit with MD, multiple medication changes were made. Spironolactone 12.5 mg daily and Farxiga 10 mg daily were started. Patient was also instructed to switch Lasix from daily to every other day and PRN. However, BNP that resulted after visit was elevated at 1259 and patient was instructed to increase Lasix to 40 mg x2 days then continue 20 mg daily. Upon reviewing medications, a few discrepancies were identified. Patient endorses she was  instructed to decrease amiodarone from 200 mg to 100 mg daily "a pretty good while ago" but is unable to tell me when or why. Endorses she has a new prescription for amiodarone 100 mg tabs at her house, but has been splitting the 200 mg tabs in half. She was also unable to tell me if she was taking metoprolol and/or carvedilol. Per chart review, carvedilol was discontinued in April. However, spoke with [facility] during our visit and they report carvedilol was picked up in both June and September for a 90 day supply as well as metoprolol was picked up in April and July for a 90 day supply. I instructed [facility] to discontinue the carvedilol prescription. Overall feeling ok. Denies dizziness, lightheadedness, fatigue, chest pain, or palpitations. Reports feeling that she is overmedicated and disoriented at times; however this is unchanged over the last year. Reports breathing is ok, movement is limited due to arthritis. Weight at home stable at 100-103 pounds. Takes Lasix 20 mg daily and has not needed any additional doses. No LEE. Denies PND/Orthopnea.   HF Medications: Metoprolol succinate 25 mg daily Entresto 49/51 mg BID Spironolactone 12.5 mg daily Farxiga 10 mg daily Lasix 20 mg daily  Has the patient been experiencing any side effects to the medications prescribed?  No  Does the patient have any problems obtaining medications due to transportation or finances?  No; [employer] Medicare  Understanding of regimen: fair Understanding of indications: fair Potential of compliance: good Patient understands to avoid NSAIDs. Patient understands to avoid decongestants.    Pertinent Lab Values: (05/05/2022) Serum creatinine 0.60, BUN 8, Potassium 3.7, Sodium 132, BNP 1259  Vital Signs: Weight: 106.2 lbs (last clinic weight: 106.4 lbs) Blood  pressure: 118/66 mmHg  Heart rate: 61 bpm  Assessment/Plan: Stage D systolic heart failure Etiology of HF: Ischemic cardiomyopathy NYHA class / AHA Stage:III, limited  mostly by arthritis, R-hip replacement Volume status & Diuretics: Euvolemic on exam. Continue Lasix 20 mg daily. BMET and BNP today pending. Vasodilators: Continue Entresto 49/51 mg BID. Beta-Blocker: Continue metoprolol succinate 25 mg daily. Instructed patient to discard the carvedilol prescription when she gets home. Canceled prescription at [facility]. She has likely been taking both medications for a few months.  MRA: Increase spironolactone to 25 mg daily. BMET in 7-10 days.  Cardiometabolic: Continue Farxiga 10 mg daily Devices therapies & Valvulopathies: CRT-D with percent pacing Advanced therapies: Not a candidate currently; will consider MitraClip if symptoms do not improve.    2. Moderate to severe mitral regurgitation  - Calcified valve; believed she would not be a great candidate for TEER, would require TEE for definitive evaluation. Will continue aggressive medical therapy for time being.    3. Atrial fibrillation  - followed by EP - amiodarone 100 mg daily, Eliquis 2.5 mg BID, metoprolol succinate 25 mg daily   4. CRT-D  - Most recent report with 99.9% BiV pacing - continues to have episodes of AF   5. CAD s/p CABG - angiography as above; no anginal symptoms at this time.  - will uptitrate GDMT and attempt to adjust imdur in the future  Follow up on 06/03/2022 for lab visit & with Dr. Daniel Nones on 07/01/2022   Audry Riles, PharmD, BCPS, BCCP, CPP Heart Failure Clinic Pharmacist [phone]

## 2022-05-26 NOTE — Patient Instructions (Addendum)
It was a pleasure seeing you today!  MEDICATIONS: -We are changing your medications today -Increase spironolactone from 1/2 tab (12.5 mg) daily to 1 tab (25 mg) daily. -STOP taking carvedilol if you have not done so already. You should be taking metoprolol succinate 25 mg daily.  -Call if you have questions about your medications.  LABS: -We will call you if your labs need attention.  NEXT APPOINTMENT: Return to clinic on 06/03/2022 for lab visit.  In general, to take care of your heart failure: -Limit your fluid intake to 2 Liters (half-gallon) per day.   -Limit your salt intake to ideally 2-3 grams (2000-3000 mg) per day. -Weigh yourself daily and record, and bring that "weight diary" to your next appointment.  (Weight gain of 2-3 pounds in 1 day typically means fluid weight.) -The medications for your heart are to help your heart and help you live longer.   -Please contact us before stopping any of your heart medications.  Call the clinic at [phone] with questions or to reschedule future appointments.

## 2022-05-30 ENCOUNTER — Other Ambulatory Visit: Payer: Self-pay | Admitting: Cardiovascular Disease

## 2022-06-03 ENCOUNTER — Ambulatory Visit (HOSPITAL_COMMUNITY)
Admission: RE | Admit: 2022-06-03 | Discharge: 2022-06-03 | Disposition: A | Discharge status: Home / Self Care | Payer: Medicare Other | Source: Ambulatory Visit | Attending: Cardiology | Admitting: Cardiology

## 2022-06-03 DIAGNOSIS — I5022 Chronic systolic (congestive) heart failure: Secondary | ICD-10-CM | POA: Diagnosis not present

## 2022-06-03 LAB — BASIC METABOLIC PANEL
Anion gap: 8 (ref 5–15)
BUN: 12 mg/dL (ref 8–23)
CO2: 27 mmol/L (ref 22–32)
Calcium: 9 mg/dL (ref 8.9–10.3)
Chloride: 96 mmol/L — ABNORMAL LOW (ref 98–111)
Creatinine, Ser: 0.78 mg/dL (ref 0.44–1.00)
GFR, Estimated: >60 mL/min (ref >60)
Glucose, Bld: 267 mg/dL — ABNORMAL HIGH (ref 70–99)
Potassium: 4.6 mmol/L (ref 3.5–5.1)
Sodium: 131 mmol/L — ABNORMAL LOW (ref 135–145)

## 2022-06-12 DIAGNOSIS — I10 Essential (primary) hypertension: Secondary | ICD-10-CM | POA: Diagnosis not present

## 2022-06-12 DIAGNOSIS — I255 Ischemic cardiomyopathy: Secondary | ICD-10-CM | POA: Diagnosis not present

## 2022-06-12 DIAGNOSIS — I5189 Other ill-defined heart diseases: Secondary | ICD-10-CM | POA: Diagnosis not present

## 2022-06-12 DIAGNOSIS — E042 Nontoxic multinodular goiter: Secondary | ICD-10-CM | POA: Diagnosis not present

## 2022-06-12 DIAGNOSIS — D696 Thrombocytopenia, unspecified: Secondary | ICD-10-CM | POA: Diagnosis not present

## 2022-06-12 DIAGNOSIS — E785 Hyperlipidemia, unspecified: Secondary | ICD-10-CM | POA: Diagnosis not present

## 2022-06-12 DIAGNOSIS — E559 Vitamin D deficiency, unspecified: Secondary | ICD-10-CM | POA: Diagnosis not present

## 2022-06-12 DIAGNOSIS — Z23 Encounter for immunization: Secondary | ICD-10-CM | POA: Diagnosis not present

## 2022-06-12 DIAGNOSIS — M810 Age-related osteoporosis without current pathological fracture: Secondary | ICD-10-CM | POA: Diagnosis not present

## 2022-06-12 DIAGNOSIS — E1140 Type 2 diabetes mellitus with diabetic neuropathy, unspecified: Secondary | ICD-10-CM | POA: Diagnosis not present

## 2022-06-12 DIAGNOSIS — I4891 Unspecified atrial fibrillation: Secondary | ICD-10-CM | POA: Diagnosis not present

## 2022-06-12 DIAGNOSIS — I2510 Atherosclerotic heart disease of native coronary artery without angina pectoris: Secondary | ICD-10-CM | POA: Diagnosis not present

## 2022-06-26 ENCOUNTER — Other Ambulatory Visit: Payer: Self-pay | Admitting: Cardiovascular Disease

## 2022-07-01 ENCOUNTER — Ambulatory Visit (HOSPITAL_COMMUNITY)
Admission: RE | Admit: 2022-07-01 | Discharge: 2022-07-01 | Disposition: A | Discharge status: Home / Self Care | Payer: Medicare Other | Source: Ambulatory Visit | Attending: Cardiology | Admitting: Cardiology

## 2022-07-01 ENCOUNTER — Encounter (HOSPITAL_COMMUNITY): Payer: Self-pay | Admitting: Cardiology

## 2022-07-01 VITALS — BP 120/60 | HR 67 | Wt 104.6 lb

## 2022-07-01 DIAGNOSIS — I5022 Chronic systolic (congestive) heart failure: Secondary | ICD-10-CM | POA: Insufficient documentation

## 2022-07-01 DIAGNOSIS — I5043 Acute on chronic combined systolic (congestive) and diastolic (congestive) heart failure: Secondary | ICD-10-CM

## 2022-07-01 DIAGNOSIS — I5084 End stage heart failure: Secondary | ICD-10-CM | POA: Diagnosis not present

## 2022-07-01 DIAGNOSIS — I255 Ischemic cardiomyopathy: Secondary | ICD-10-CM | POA: Diagnosis not present

## 2022-07-01 DIAGNOSIS — I480 Paroxysmal atrial fibrillation: Secondary | ICD-10-CM | POA: Diagnosis not present

## 2022-07-01 DIAGNOSIS — I110 Hypertensive heart disease with heart failure: Secondary | ICD-10-CM | POA: Insufficient documentation

## 2022-07-01 DIAGNOSIS — E119 Type 2 diabetes mellitus without complications: Secondary | ICD-10-CM | POA: Insufficient documentation

## 2022-07-01 DIAGNOSIS — E785 Hyperlipidemia, unspecified: Secondary | ICD-10-CM | POA: Diagnosis not present

## 2022-07-01 DIAGNOSIS — Z950 Presence of cardiac pacemaker: Secondary | ICD-10-CM | POA: Diagnosis not present

## 2022-07-01 DIAGNOSIS — I340 Nonrheumatic mitral (valve) insufficiency: Secondary | ICD-10-CM | POA: Insufficient documentation

## 2022-07-01 DIAGNOSIS — I252 Old myocardial infarction: Secondary | ICD-10-CM | POA: Diagnosis not present

## 2022-07-01 DIAGNOSIS — I25119 Atherosclerotic heart disease of native coronary artery with unspecified angina pectoris: Secondary | ICD-10-CM

## 2022-07-01 DIAGNOSIS — Z79899 Other long term (current) drug therapy: Secondary | ICD-10-CM | POA: Insufficient documentation

## 2022-07-01 DIAGNOSIS — M170 Bilateral primary osteoarthritis of knee: Secondary | ICD-10-CM | POA: Insufficient documentation

## 2022-07-01 DIAGNOSIS — Z951 Presence of aortocoronary bypass graft: Secondary | ICD-10-CM | POA: Insufficient documentation

## 2022-07-01 DIAGNOSIS — I447 Left bundle-branch block, unspecified: Secondary | ICD-10-CM | POA: Insufficient documentation

## 2022-07-01 DIAGNOSIS — I2510 Atherosclerotic heart disease of native coronary artery without angina pectoris: Secondary | ICD-10-CM | POA: Insufficient documentation

## 2022-07-01 DIAGNOSIS — Z7901 Long term (current) use of anticoagulants: Secondary | ICD-10-CM | POA: Diagnosis not present

## 2022-07-01 LAB — BASIC METABOLIC PANEL
Anion gap: 9 (ref 5–15)
BUN: 15 mg/dL (ref 8–23)
CO2: 28 mmol/L (ref 22–32)
Calcium: 9.7 mg/dL (ref 8.9–10.3)
Chloride: 95 mmol/L — ABNORMAL LOW (ref 98–111)
Creatinine, Ser: 0.86 mg/dL (ref 0.44–1.00)
GFR, Estimated: >60 mL/min (ref >60)
Glucose, Bld: 195 mg/dL — ABNORMAL HIGH (ref 70–99)
Potassium: 4.3 mmol/L (ref 3.5–5.1)
Sodium: 132 mmol/L — ABNORMAL LOW (ref 135–145)

## 2022-07-01 NOTE — Patient Instructions (Signed)
Return to see Dr. Acie Fredrickson in January. Call his office for appointment. Call for appointment with [place] if needed.

## 2022-07-01 NOTE — Progress Notes (Signed)
ADVANCED HEART FAILURE CLINIC NOTE  Referring Physician: Reynold Bowen, MD  Primary Care: Reynold Bowen, MD Primary Cardiologist: Mertie Moores, MD  HPI: Cynthia [name] is a 88 y.o. female coronary artery disease status post CABG, dyslipidemia, type 2 diabetes, R-hip replacement and systolic heart failure diagnosed in 2022 (LVEF of 30 to 35%) status post CRT-D presenting today to establish care. Cynthia [name]'s cardiac history dates back to 1998 when she underwent CABG. In 2017, due to decline in functional status and chest pain, she underwent bypass/coronary angiography that demonstrated an atretic LIMA, SVG-RCA and ostial occlusion of the Lcx. In 2022 her LVEF dropped from 60% to 30-35% w/ new LBBB. She underwent CRT-D in 12/22 that initially improved her symptoms significantly. Unfortunately, shortly after she had episodes of atrial fibrillation requiring hospitalization, initiation of amiodarone and cardioversion. Since that time she has done fairly well from a cardiac standpoint, however, continues to feel limited w/ TTE demonstrating persistent reduction in LVEF and moderate to severe MR.   Interval hx: Since her last appointment Cynthia [name] has been doing very well from a cardiac standpoint.  She reports no shortness of breath, lower extremity edema, chest pain and only very minimal lightheadedness.  She does report that her memory has become worse and and feels limited due to arthritis of both knees.  Otherwise she continues to drive and performs ADLs independently.  Activity level/exercise tolerance: Limited mostly due to arthritis, from a cardiac standpoint NYHA IIb Orthopnea:  Sleeps on 1-2 pillows Paroxysmal noctural dyspnea:  No Chest pain/pressure:  No Orthostatic lightheadedness:  No Palpitations:  Resolved Lower extremity edema: No Presyncope/syncope:  No Cough:  No  Past Medical History:  Diagnosis Date   Anxiety    Arthritis    Carotid bruit    LEFT   Coronary  artery disease    STATUS POST PCI AND LATER CABG   Diabetes mellitus    Dyslipidemia    Dysrhythmia    hx of palpitations and PVC's    History of chicken pox    History of echocardiogram    Echo 09/08/2022: mild LVH, EF 55-60, no RWMA, Gr 1 DD, MAC, mild MR, mild LAE, mild TR, PASP 31   Hypertension    Measles    hx of    Mumps    hx of    Myocardial infarction (HCC)    PAF (paroxysmal atrial fibrillation) ([place]) 10/22/2021   Palpitations    PVC's (premature ventricular contractions)     Current Outpatient Medications  Medication Sig Dispense Refill   amiodarone (PACERONE) 200 MG tablet Take 200 mg by mouth daily. No medication on Sunday     apixaban (ELIQUIS) 2.5 MG TABS tablet Take 1 tablet (2.5 mg total) by mouth 2 (two) times daily. 60 tablet 3   atorvastatin (LIPITOR) 40 MG tablet TAKE 1 TABLET DAILY 90 tablet 3   B-D INS SYR ULTRAFINE 1CC/31G 31G X 5/16" 1 ML MISC      BD PEN NEEDLE NANO U/F 32G X 4 MM MISC Inject 1 each into the skin as directed. With insulin  6   Calcium Carb-Cholecalciferol (CALCIUM 600 + D PO) Take 1 tablet by mouth 2 (two) times daily.     Coenzyme Q10 (COQ10) 200 MG CAPS Take 200 mg by mouth every evening.     dapagliflozin propanediol (FARXIGA) 10 MG TABS tablet Take 1 tablet (10 mg total) by mouth daily before breakfast. 30 tablet 6   ezetimibe (ZETIA) 10 MG tablet  TAKE 1 TABLET DAILY 90 tablet 2   furosemide (LASIX) 20 MG tablet TAKE 1 TABLET BY MOUTH EVERY DAY 90 tablet 2   insulin aspart (NOVOLOG FLEXPEN) 100 UNIT/ML FlexPen Inject 6 Units into the skin 3 (three) times daily with meals.     insulin degludec (TRESIBA FLEXTOUCH) 100 UNIT/ML FlexTouch Pen Inject 11 Units into the skin daily.     isosorbide mononitrate (IMDUR) 60 MG 24 hr tablet TAKE 1 TABLET BY MOUTH EVERY DAY 90 tablet 1   Magnesium 400 MG CAPS Take 400 mg by mouth daily in the afternoon.     metoprolol succinate (TOPROL XL) 25 MG 24 hr tablet Take 1 tablet (25 mg total) by mouth daily. 30  tablet 11   Multiple Vitamin (MULTIVITAMIN) tablet Take 1 tablet by mouth every morning.      nitroGLYCERIN (NITROSTAT) 0.4 MG SL tablet Place 1 tablet (0.4 mg total) under the tongue every 5 (five) minutes as needed for chest pain. 25 tablet 3   ONE TOUCH ULTRA TEST test strip 2 (two) times daily. test blood sugar  5   ONETOUCH DELICA LANCETS 33I MISC CHECK BLOOD SUGARS 2 TIMES A DAY  5   Polyethyl Glycol-Propyl Glycol (SYSTANE OP) Apply 1 drop to eye 2 (two) times daily as needed (dry/scratchy eyes).     sacubitril-valsartan (ENTRESTO) 49-51 MG Take 1 tablet by mouth 2 (two) times daily. 180 tablet 3   sitaGLIPtin-metformin (JANUMET) 50-1000 MG tablet Take 1 tablet by mouth 2 (two) times daily with a meal.     spironolactone (ALDACTONE) 25 MG tablet Take 1 tablet (25 mg total) by mouth daily. 15 tablet 3   vitamin C (ASCORBIC ACID) 500 MG tablet Take 500 mg by mouth every evening.      No current facility-administered medications for this encounter.    Allergies  Allergen Reactions   Hydrochlorothiazide     Other reaction(s): hyponatremia      Social History   Socioeconomic History   Marital status: Widowed    Spouse name: Not on file   Number of children: Not on file   Years of education: Not on file   Highest education level: Not on file  Occupational History   Not on file  Tobacco Use   Smoking status: Never   Smokeless tobacco: Never   Tobacco comments:    Never smoke 10/23/21  Vaping Use   Vaping Use: Never used  Substance and Sexual Activity   Alcohol use: No   Drug use: No   Sexual activity: Not on file  Other Topics Concern   Not on file  Social History Narrative   Not on file   Social Determinants of Health   Financial Resource Strain: Not on file  Food Insecurity: No Food Insecurity (04/16/2022)   Hunger Vital Sign    Worried About Running Out of Food in the Last Year: Never true    Ran Out of Food in the Last Year: Never true  Transportation Needs: No  Transportation Needs (04/16/2022)   PRAPARE - Hydrologist (Medical): No    Lack of Transportation (Non-Medical): No  Physical Activity: Not on file  Stress: Not on file  Social Connections: Not on file  Intimate Partner Violence: Not on file      Family History  Problem Relation Age of Onset   Heart attack Mother    Stroke Father    Cancer Father     PHYSICAL EXAM: Vitals:  07/01/22 1109  BP: 120/60  Pulse: 67  SpO2: 98%   GENERAL: Elderly white female sitting comfortably, no apparent distress HEENT: Negative for arcus senilis or xanthelasma. There is no scleral icterus.  The mucous membranes are pink and moist.   NECK: Supple, No masses. Normal carotid upstrokes without bruits. No masses or thyromegaly.    CHEST: There are no chest wall deformities. There is no chest wall tenderness. Respirations are unlabored.  Lungs- CTA B/L CARDIAC:  JVP: Less than 7 cm         Normal S1, S2  Normal rate with regular rhythm. No murmurs, rubs or gallops.  Pulses are 2+ and symmetrical in upper and lower extremities.  No edema ABDOMEN: Soft, non-tender, non-distended. There are no masses or hepatomegaly. There are normal bowel sounds.  EXTREMITIES: Warm and well-perfused LYMPHATIC: No axillary or supraclavicular lymphadenopathy.  NEUROLOGIC: Patient is oriented x3 with no focal or lateralizing neurologic deficits.  PSYCH: Patients affect is appropriate, there is no evidence of anxiety or depression.  SKIN: Warm and dry; no lesions or wounds.   DATA REVIEW  WRU:EAVW  ECHO: 01/26/22: LVEF 30-35%, akinesis of the inferior wall; apex is aneurysmal, mod-severe MR  CATH: 07/27/2016: Significant single-vessel coronary artery disease, with ostial occlusion of LCx. Mild to moderate, non-obstructive CAD involving the ostial LMCA and mid RCA.  Proximal/mid LAD stent is patent with minimal in-stent restenosis. Patent SVG to OM2 and SVG to D2.  SVG to D2 demonstrates  30% proximal graft stenosis. Ostially occluded SVG to RCA/PDA. Atretic LIMA. Mildly elevated left ventricular filling pressure. Moderately reduced left ventricular contraction with inferior and apical akinesis (LVEF 35-40%).   ASSESSMENT & PLAN:  Stage D systolic heart failure Etiology of HF: Ischemic cardiomyopathy NYHA class / AHA Stage:III, limited mostly by arthritis, R-hip replacement Volume status & Diuretics: Euvolemic on exam, will continue Lasix 20 every other day.  Patient taking as needed right now Vasodilators:Entresto 49/68m BID Beta-Blocker:toprol xl 25 MRA: Continue spiro 12.553mdaily Cardiometabolic: Continue farxiga 1062maily Devices therapies & Valvulopathies: CRT-D  Advanced therapies: From a heart failure standpoint, Cynthia [name] doing remarkably well.  She has no dyspnea, lower extremity edema, PND or orthopnea.  She appears well compensated on exam and her biggest complaints at this time are decreased ambulation due to lower extremity arthritis and forgetfulness.  At this time not a candidate for advanced therapy and in the setting of stable functional status will only follow-up as needed.  2. Moderate to severe mitral regurgitation  - Calcified valve; I do not believe she would be a great candidate for TEER, would require TEE for definitive evaluation. Will continue aggressive medical therapy for time being.   3. Atrial fibrillation  - followed by EP - amiodarone 200m14mily, Eliquis 2.5mg 50m, toprol XL 25mg 89my  4. CRT-D  - Most recent report with 99.9% BiV pacing - continues to have episodes of AF  5. CAD s/p CABG - angiography as above; no anginal symptoms at this time.  - will uptitrate GDMT and attempt to adjust imdur in the future  Follow-up:  PRN  Cynthia [name] Advanced Heart Failure Mechanical Circulatory Support

## 2022-07-10 ENCOUNTER — Ambulatory Visit: Payer: Self-pay

## 2022-07-10 NOTE — Patient Outreach (Signed)
  Care Coordination   07/10/2022 Name: Cynthia [name] MRN: [id number] DOB: Dec 30, 1931   Care Coordination Outreach Attempts:  An unsuccessful telephone outreach was attempted for a scheduled appointment today.  Follow Up Plan:  Additional outreach attempts will be made to offer the patient care coordination information and services.   Encounter Outcome:  Pt. Request to Call Back   Care Coordination Interventions:  No, not indicated    Barb Merino, RN, BSN, CCM Care Management Coordinator [facility] Direct Phone: [phone]

## 2022-07-17 ENCOUNTER — Ambulatory Visit: Payer: Self-pay

## 2022-07-17 NOTE — Patient Outreach (Signed)
  Care Coordination   07/17/2022 Name: CHANTALLE DEFILIPPO MRN: [id number] DOB: 1932/06/16   Care Coordination Outreach Attempts:  An unsuccessful telephone outreach was attempted today to offer the patient information about available care coordination services as a benefit of their health plan.   Follow Up Plan:  Additional outreach attempts will be made to offer the patient care coordination information and services.   Encounter Outcome:  No Answer   Care Coordination Interventions:  No, not indicated    Barb Merino, RN, BSN, CCM Care Management Coordinator [facility] Direct Phone: [phone]

## 2022-07-21 ENCOUNTER — Ambulatory Visit (INDEPENDENT_AMBULATORY_CARE_PROVIDER_SITE_OTHER): Payer: Medicare Other

## 2022-07-21 DIAGNOSIS — I255 Ischemic cardiomyopathy: Secondary | ICD-10-CM

## 2022-07-21 LAB — CUP PACEART REMOTE DEVICE CHECK
Battery Remaining Longevity: 95 mo
Battery Voltage: 3 V
Brady Statistic AP VP Percent: 99.87 %
Brady Statistic AP VS Percent: 0.08 %
Brady Statistic AS VP Percent: 0.05 %
Brady Statistic AS VS Percent: 0 %
Brady Statistic RA Percent Paced: 99.95 %
Brady Statistic RV Percent Paced: 99.91 %
Date Time Interrogation Session: 20231212010734
Implantable Lead Connection Status: 753985
Implantable Lead Connection Status: 753985
Implantable Lead Connection Status: 753985
Implantable Lead Implant Date: 20221212
Implantable Lead Implant Date: 20221212
Implantable Lead Implant Date: 20221212
Implantable Lead Location: 753858
Implantable Lead Location: 753859
Implantable Lead Location: 753860
Implantable Lead Model: 3830
Implantable Lead Model: 4798
Implantable Lead Model: 5076
Implantable Pulse Generator Implant Date: 20221212
Lead Channel Impedance Value: 304 Ohm
Lead Channel Impedance Value: 323 Ohm
Lead Channel Impedance Value: 380 Ohm
Lead Channel Impedance Value: 380 Ohm
Lead Channel Impedance Value: 380 Ohm
Lead Channel Impedance Value: 418 Ohm
Lead Channel Impedance Value: 437 Ohm
Lead Channel Impedance Value: 456 Ohm
Lead Channel Impedance Value: 589 Ohm
Lead Channel Impedance Value: 589 Ohm
Lead Channel Impedance Value: 608 Ohm
Lead Channel Impedance Value: 608 Ohm
Lead Channel Impedance Value: 665 Ohm
Lead Channel Impedance Value: 665 Ohm
Lead Channel Pacing Threshold Amplitude: 0.625 V
Lead Channel Pacing Threshold Amplitude: 1 V
Lead Channel Pacing Threshold Amplitude: 1.375 V
Lead Channel Pacing Threshold Pulse Width: 0.4 ms
Lead Channel Pacing Threshold Pulse Width: 0.4 ms
Lead Channel Pacing Threshold Pulse Width: 0.6 ms
Lead Channel Sensing Intrinsic Amplitude: 1.125 mV
Lead Channel Sensing Intrinsic Amplitude: 1.125 mV
Lead Channel Sensing Intrinsic Amplitude: 21.125 mV
Lead Channel Sensing Intrinsic Amplitude: 21.125 mV
Lead Channel Setting Pacing Amplitude: 2 V
Lead Channel Setting Pacing Amplitude: 2.5 V
Lead Channel Setting Pacing Amplitude: 2.5 V
Lead Channel Setting Pacing Pulse Width: 0.4 ms
Lead Channel Setting Pacing Pulse Width: 0.6 ms
Lead Channel Setting Sensing Sensitivity: 0.9 mV
Zone Setting Status: 755011
Zone Setting Status: 755011

## 2022-07-22 DIAGNOSIS — Z1231 Encounter for screening mammogram for malignant neoplasm of breast: Secondary | ICD-10-CM | POA: Diagnosis not present

## 2022-07-31 ENCOUNTER — Other Ambulatory Visit (HOSPITAL_COMMUNITY): Payer: Self-pay | Admitting: Cardiology

## 2022-07-31 DIAGNOSIS — I5022 Chronic systolic (congestive) heart failure: Secondary | ICD-10-CM

## 2022-08-10 DIAGNOSIS — B349 Viral infection, unspecified: Secondary | ICD-10-CM | POA: Diagnosis not present

## 2022-08-10 DIAGNOSIS — Z20822 Contact with and (suspected) exposure to covid-19: Secondary | ICD-10-CM | POA: Diagnosis not present

## 2022-08-14 NOTE — Progress Notes (Signed)
Scheduled 08/25/22  [place]  Direct Dial: [phone]

## 2022-08-15 NOTE — Progress Notes (Unsigned)
Cardiology Clinic Note   Patient Name: Cynthia [name] Date of Encounter: 08/17/2022  Primary Care Provider:  Reynold Bowen, MD Primary Cardiologist:  Mertie Moores, MD  Patient Profile    Cynthia [name] is a 89 y.o. 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   Past Medical History    Past Medical History:  Diagnosis Date   Anxiety    Arthritis    Chronic HFrEF (heart failure with reduced ejection fraction) (HCC)    Coronary artery disease    STATUS POST PCI AND LATER CABG   Diabetes mellitus    Dyslipidemia    History of chicken pox    History of echocardiogram    Echo Sep 12, 2022: mild LVH, EF 55-60, no RWMA, Gr 1 DD, MAC, mild MR, mild LAE, mild TR, PASP 31   Hypertension    Hypomagnesemia    Hyponatremia    Measles    hx of    Mitral regurgitation    Mumps    hx of    Myocardial infarction (HCC)    NSVT (nonsustained ventricular tachycardia) (HCC)    PAF (paroxysmal atrial fibrillation) (HCC)    Palpitations    PVC's (premature ventricular contractions)    Thrombocytopenia (HCC)    Tricuspid regurgitation    Past Surgical History:  Procedure Laterality Date   BIV PACEMAKER INSERTION CRT-P N/A 07/21/2021   Procedure: BIV PACEMAKER INSERTION CRT-P;  Surgeon: Evans Lance, MD;  Location: [facility];  Service: Cardiovascular;  Laterality: N/A;   CARDIAC CATHETERIZATION  06/06/97   IT REVEALA MILD TO MODERATE LEFT VENTRICULAR SYSTOLIC DYSFUNCTION  WITH EF OF 40%. THERE IS PERSISTENT AKINESIS OF THE INFERIOR WALL. THERE IS MILD REGURGITATION PRESENT. THERE IS MILD  HYPOKINESIS OF THE ANTERIOR  AND APEX WALL   CARDIAC CATHETERIZATION N/A 07/27/2016   Procedure: Left Heart Cath and Cors/Grafts Angiography;  Surgeon: Nelva Bush, MD;  Location: [facility];  Service: Cardiovascular;  Laterality: N/A;   CARDIOVERSION N/A 12/17/2021   Procedure: CARDIOVERSION;  Surgeon: Jerline Pain, MD;  Location: [facility];  Service: Cardiovascular;  Laterality: N/A;   CORONARY ARTERY BYPASS GRAFT     TONSILLECTOMY     TOTAL HIP ARTHROPLASTY Right 03/12/2015   Procedure: RIGHT TOTAL HIP ARTHROPLASTY ANTERIOR APPROACH;  Surgeon: Paralee Cancel, MD;  Location: [facility];  Service: Orthopedics;  Laterality: Right;    Allergies  Allergies  Allergen Reactions   Hydrochlorothiazide     Other reaction(s): hyponatremia    History of Present Illness    Cynthia [name] has a past medical history of: CAD. April 1998: PCI to proximal to mid LAD. November 1998 CABG x 4: LIMA to LAD, SVG to OM2, SVG to D2, SVG to RCA/PDA.  LHC 07/27/2016: Significant single vessel disease ostial occlusion LCx. Mild to moderate nonobstructive CAD ostial LMCA and mid RCA. Proximal/mid LAD stent patent with minimal in-stent restenosis. Patent SVG to OM2 and SVG to D2. 30% proximal graft stenosis of SVG to D2. Ostially occluded SVG to RCA/PDA. Atretic LIMA. Moderately reduced LV contraction with inferior and apical akinesis, LVEF 35-40%.  Stress test 04/29/2021: No evidence of ischemia.  There  is evidence of infarction.  There is a large defect with severe reduction in uptake present in the apical to basal inferior, inferior lateral and apex locations that is fixed.  Abnormal wall motion of the defect area consistent with infarction.  Left ventricular function is abnormal.  Moderately decreased left ventricular EF. Chronic combined systolic and diastolic heart failure/LBBB s/p BiV PM.  Echo 08/18/2016: EF 55-60%. Grade I DD. Mitral valve with calcified annulus and mildly thickened leaflets. Mild  MR. Mildly dilated left atrium. Mild TR.  Echo 03/17/2021: EF 30 to 35%.  Regional wall motion abnormalities.  Abnormal septal motion consistent with LBBB.  Mild LVH.  Grade II DD.  Mildly dilated left atrium.  Mild MR.  Moderate MAC.  Trivial AR.  Trivial TR. Echo 06/17/2021: EF 25 to 30%.  Hypokinesis.  Grade II DD.  Severely dilated left atrium.  Moderate MR.  Moderate MAC.  Moderate TR.  Trivial AR. BiV pacemaker insertion 07/21/2021: Successful implantation of [employer]onic BiV pacemaker for symptomatic LBBB and chronic systolic heart failure. Remote device check 07/21/2022: Normal device function.  Leads and battery stable. Mitral valve stenosis. Echo (limited) 01/26/2022: EF 30 to 35%.  Regional wall motion abnormalities.  LV apex akinetic and aneurysmal no definite apical thrombus.  Mildly reduced right ventricular systolic function.  Mildly elevated pulmonary artery systolic pressure.  Right ventricular systolic pressure 12.4 mmHg.  Moderately dilated left atrium.  Mildly dilated right atrium.  Severe MR.  Moderate to severe MAC.  Moderate to severe TR.  Trivial AR.  Mild calcification of aortic valve. Palpitations/Afib.  48-hour holter monitor 08/06/2016: NSR, occasional PVCs, rare NSVT.  In clinic BiV check 07/31/2021: 3 EGM's reviewed, appear AF with max VR 136 bpm, longest in duration 29 minutes 42 seconds. Remote device check 10/22/2021: Ongoing AF/flutter since 10/18/2021.  Burden 18.6%. In clinic device check 11/04/2021: 32 AT/AF episodes 29.8% burden.  Bi-ventricularly pacing 91% of the time. DCCV 12/17/2021: Successful cardioversion. Carotid artery disease. Carotid duplex US 02/14/2018: Bilateral [name] 1-39%.  Hypertension.  Hyperlipidemia.  Lipid panel 06/12/2022: LDL 37, HDL 38, TG 109, total 97. T2DM.   Cynthia [name] is a long time patient of cardiology followed by Dr. Acie Fredrickson. She has a history of PCI in April of 1998 followed by CABG x 4 in November 1998 as well as the above medical  problems.  Patient was seen by Dr. Acie Fredrickson in July 2022 and complained of increased shortness of breath.  Her EKG showed LBBB.  Echo showed a drop in her EF from 55 to 60% in January 2018 to 30 to 35% along with an LBBB.  She was started on Coreg.  She was evaluated by Dr. Lovena Le on 07/08/2021 with complaints of class III heart failure symptoms.  Patient underwent BiV pacemaker insertion on 07/21/2021.  Patient was found to be in A-fib at follow-up visit for wound check.  Remote transmission in March 2023 showed 18.6% A-fib burden.  CHA2DS2-VASc score 7.  She was started on Eliquis and carvedilol was increased for clinic visit.  A-fib with RVR worsened heart failure symptoms.  She had no cardiac awareness of A-fib.  She underwent successful cardioversion May 2023.  She established care with the advanced heart failure clinic, Dr. Daniel Nones, in September 2023.  Patient was last seen in the office by Dr. Daniel Nones on 07/01/2022.  At that time she was doing well with continued independence with ADLs and driving. Dr. Daniel Nones feels patient is not a good candidate for TEER dependent on TEE  for complete evaluation. Aggressive medical therapy continued for now.   Today, patient is accompanied by her nephew. Outside of exam room if he reports he feels patient is "going downhill."  He states she is not taking her medications, not eating, and appears confused.  He states he should have taken her to the ED on Friday but decided to wait to have her evaluated at this appointment today. Nephew questions patient's ability to continue to live alone.    Patient is a difficult/unreliable historian. Patient reports ongoing weakness for several months but worse over the last couple of weeks.  She endorses decreased appetite and intermittent confusion. She states she is taking her medication.  Most notably she fell in the middle of the night on Saturday.  She states she got up to use the bathroom and woke up on the floor near the  bathroom. She does not believe she hit her head but is unsure. No bruising noted.  She tried to get up on her own but was unable to do so she then attempted to crawl closer to the bathroom to use something to help her up but "gave out" so she laid there for an unknown period of time.  Someone from church arrived to help her up.  Patient states she had someone stay with her last night who needed to help her bathe and dress, which is unusual for her.  She also reports right-sided abdominal pain starting just above her waist and radiating up to under her breast that began prior to this weekend but patient is uncertain exactly when.  She denies nausea or vomiting.  Her blood sugars have been normal per her report but she cannot quantify them at this time. Patient reports shortness of breath and dyspnea on exertion is normal for her but progressively worse in the last 2 weeks. No chest pain, pressure, or tightness. Denies lower extremity edema, orthopnea, or PND. No palpitations. No fever, chills, cough, dysuria, or hematuria.  Denies headaches, dizziness, or lightheadedness.  She is urinating and moving her bowels as normal.     Home Medications    Current Meds  Medication Sig   amiodarone (PACERONE) 200 MG tablet Take 200 mg by mouth daily. No medication on Sunday   amoxicillin (AMOXIL) 500 MG capsule TAKE 4 CAPSULES BY MOUTH 1 HOUR PRIOR TO PROCEDURE   apixaban (ELIQUIS) 2.5 MG TABS tablet Take 1 tablet (2.5 mg total) by mouth 2 (two) times daily.   atorvastatin (LIPITOR) 40 MG tablet TAKE 1 TABLET DAILY   B-D INS SYR ULTRAFINE 1CC/31G 31G X 5/16" 1 ML MISC    BD PEN NEEDLE NANO U/F 32G X 4 MM MISC Inject 1 each into the skin as directed. With insulin   Calcium Carb-Cholecalciferol (CALCIUM 600 + D PO) Take 1 tablet by mouth 2 (two) times daily.   carvedilol (COREG) 6.25 MG tablet Take 6.25 mg by mouth 2 (two) times daily.   Coenzyme Q10 (COQ10) 200 MG CAPS Take 200 mg by mouth every evening.    dapagliflozin propanediol (FARXIGA) 10 MG TABS tablet Take 1 tablet (10 mg total) by mouth daily before breakfast.   ezetimibe (ZETIA) 10 MG tablet TAKE 1 TABLET DAILY   furosemide (LASIX) 20 MG tablet TAKE 1 TABLET BY MOUTH EVERY DAY   insulin aspart (NOVOLOG FLEXPEN) 100 UNIT/ML FlexPen Inject 6 Units into the skin 3 (three) times daily with meals.   insulin degludec (TRESIBA FLEXTOUCH) 100 UNIT/ML FlexTouch Pen Inject 11 Units into  the skin daily.   isosorbide mononitrate (IMDUR) 60 MG 24 hr tablet TAKE 1 TABLET BY MOUTH EVERY DAY   Magnesium 400 MG CAPS Take 400 mg by mouth daily in the afternoon.   metoprolol succinate (TOPROL XL) 25 MG 24 hr tablet Take 1 tablet (25 mg total) by mouth daily.   Multiple Vitamin (MULTIVITAMIN) tablet Take 1 tablet by mouth every morning.   nitroGLYCERIN (NITROSTAT) 0.4 MG SL tablet Place 1 tablet (0.4 mg total) under the tongue every 5 (five) minutes as needed for chest pain.   ondansetron (ZOFRAN) 4 MG tablet Take 4 mg by mouth 2 (two) times daily.   ONE TOUCH ULTRA TEST test strip 2 (two) times daily. test blood sugar   ONETOUCH DELICA LANCETS 68T MISC    Polyethyl Glycol-Propyl Glycol (SYSTANE OP) Apply 1 drop to eye 2 (two) times daily as needed (dry/scratchy eyes).   sacubitril-valsartan (ENTRESTO) 49-51 MG Take 1 tablet by mouth 2 (two) times daily.   sitaGLIPtin-metformin (JANUMET) 50-1000 MG tablet Take 1 tablet by mouth 2 (two) times daily with a meal.   spironolactone (ALDACTONE) 25 MG tablet TAKE 1/2 TABLET BY MOUTH EVERY DAY   vitamin C (ASCORBIC ACID) 500 MG tablet Take 500 mg by mouth every evening.    Family History    Family History  Problem Relation Age of Onset   Heart attack Mother    Stroke Father    Cancer Father    She indicated that her mother is deceased. She indicated that her father is deceased. She indicated that her maternal grandmother is deceased. She indicated that her maternal grandfather is deceased. She indicated that  her paternal grandmother is deceased. She indicated that her paternal grandfather is deceased.   Social History    Social History   Socioeconomic History   Marital status: Widowed    Spouse name: Not on file   Number of children: Not on file   Years of education: Not on file   Highest education level: Not on file  Occupational History   Not on file  Tobacco Use   Smoking status: Never   Smokeless tobacco: Never   Tobacco comments:    Never smoke 10/23/21  Vaping Use   Vaping Use: Never used  Substance and Sexual Activity   Alcohol use: No   Drug use: No   Sexual activity: Not on file  Other Topics Concern   Not on file  Social History Narrative   Not on file   Social Determinants of Health   Financial Resource Strain: Not on file  Food Insecurity: No Food Insecurity (04/16/2022)   Hunger Vital Sign    Worried About Running Out of Food in the Last Year: Never true    Ran Out of Food in the Last Year: Never true  Transportation Needs: No Transportation Needs (04/16/2022)   PRAPARE - Hydrologist (Medical): No    Lack of Transportation (Non-Medical): No  Physical Activity: Not on file  Stress: Not on file  Social Connections: Not on file  Intimate Partner Violence: Not on file     Review of Systems    General:  No chills, fever, night sweats or weight changes. Generalized weakness.  Cardiovascular:  No chest pain, dyspnea on exertion, edema, orthopnea, palpitations, paroxysmal nocturnal dyspnea. Dermatological: No rash, lesions/masses Respiratory: No cough, dyspnea Urologic: No hematuria, dysuria Abdominal:   No nausea, vomiting, diarrhea, bright red blood per rectum, melena, or hematemesis. Right side/abdominal  pain Neurologic:  No visual changes, weakness. Positive for changes in mental status per nephew and patient. All other systems reviewed and are otherwise negative except as noted above.  Physical Exam    VS:  BP 112/60   Pulse  74   Ht _0  (1.575 m)   Wt 97 lb 3.2 oz (44.1 kg)   SpO2 98%   BMI 17.78 kg/m  , BMI Body mass index is 17.78 kg/m. GEN:  Well nourished, well developed, non-toxic appearing, in no acute distress. HEENT: Normal. Neck: Supple, no JVD, carotid bruits, or masses. Cardiac: RRR, no murmurs, rubs, or gallops. No clubbing, cyanosis, edema.  Radials/DP/PT 2+ and equal bilaterally.  Respiratory:  Respirations regular and unlabored, clear to auscultation bilaterally. GI: Soft, nondistended. Tenderness to palpation right upper abdomen extending to under right breast.  MS: No deformity or atrophy. Skin: Warm and dry, no rash. Neuro: Strength and sensation are intact. Alert and oriented x 4.  Psych: Normal affect.  Accessory Clinical Findings    Recent Labs: 12/12/2021: Hemoglobin 11.8; Platelets 120 06/12/2022: TSH 1.800 01/07/2022: Magnesium 1.3 05/05/2022: ALT 27 05/26/2022: B Natriuretic Peptide 816.8 07/01/2022: BUN 15; Creatinine, Ser 0.86; Potassium 4.3; Sodium 132     ECG personally reviewed by me today: Atrial sensed, ventricular paced rhythm, heart rate 71.  No significant changes from 05/05/2022.   CHA2DS2-VASc Score = 7   This indicates a 11.2% annual risk of stroke. The patient's score is based upon: CHF History: 1 HTN History: 1 Diabetes History: 1 Stroke History: 0 Vascular Disease History: 1 Age Score: 2 Gender Score: 1      Assessment & Plan   Generalized weakness/questionable syncope/fall.  Patient endorses months long progressive weakness worsening over the last 2 weeks.  She reports following Saturday night on her way to the bathroom.  No recollection of fall but awoke on the floor and was too weak to get herself up so she laid there until somebody arrived to help her.  Normally independent with ADLs and personal hygiene but needed assistance over the weekend for bathing and dressing.  Patient denies fever, chills, cough, dysuria or hematuria.  She is alert and  oriented.  EKG shows atrial sensed ventricular paced rhythm, heart rate 71.  She is afebrile and normotensive.  Patient is stable from a cardiac standpoint without signs of fluid overload.  Shared decision making with patient and nephew regarding ED evaluation from a medical standpoint. Will need consideration of infection workup, possible rhabdo, dehydration, etc. They are in agreement with recommendation to report to the ED for further evaluation given patient's reports of weakness and in light of her recent fall/syncope. If she needs to be admitted, recommend this be a medicine admission and cardiology can consult if needed.  CAD.  PCI to LAD from 1998.  CABG times 13 June 1997.  [place] 2017 showed significant single-vessel ostial occlusion LCx.  Mild to moderate nonobstructive CAD ostial LMCA and mid RCA.  LAD stent was patent with minimal in-stent restenosis.  Patent SVG to OM2 and SVG to D2 with 30% proximal graft stenosis SVG to D2.  Ostially occluded SVG to RCA/PDA.  Atretic LIMA. Patient denies chest pain, pressure, or tightness.  Continue Eliquis, atorvastatin, Zetia, metoprolol, isosorbide, and as needed SL NTG. Combined systolic and diastolic heart failure/LBBB/persistent A-fib. S/p BiV pacemaker December 2022.  A-fib developed following pacemaker placement.  Cardioverted May 2023.  Maintaining normal sinus last remote device check December 2023.  Echo 2023 EF 30 to  35% severe MR.  Patient denies palpitations. Euvolemic and well compensated on exam.  Lungs are clear to auscultation.  Regular rate and rhythm - maintaining NSR with BiV pacing. Continue amiodarone, Eliquis, Farxiga, Lasix, isosorbide, metoprolol, spironolactone, and Entresto. Patient continues to meet criteria for reduced dosing of Eliquis (age >45, weight <60 kg), will continue on Eliquis 2.5 mg bid. Can revisit updating amiodarone labs going forward depending on ER workup. Hypertension.  BP today 112/60.  Patient denies headaches or  dizziness. We are unclear whether she is actually taking her medicines. Her disposition to resume will depend on her ER workup. Hyperlipidemia.  LDL November 2023 37, at goal.  Continue atorvastatin and Zetia.     Disposition: Per shared decision making patient will report to ED with nephew for further evaluation of generalized weakness, syncope, and fall.  If patient is not admitted to hospital would recommend close follow-up with PCP within 3 days.  Return to cardiology in 3 weeks or sooner as needed.   Justice Britain. Slyvia Lartigue, DNP, NP-C     08/17/2022, 10:08 AM [address] Office [phone] Fax [phone]

## 2022-08-16 ENCOUNTER — Encounter: Payer: Self-pay | Admitting: Physician Assistant

## 2022-08-17 ENCOUNTER — Other Ambulatory Visit: Payer: Self-pay

## 2022-08-17 ENCOUNTER — Emergency Department (HOSPITAL_BASED_OUTPATIENT_CLINIC_OR_DEPARTMENT_OTHER): Payer: Medicare Other | Admitting: Radiology

## 2022-08-17 ENCOUNTER — Emergency Department (HOSPITAL_BASED_OUTPATIENT_CLINIC_OR_DEPARTMENT_OTHER): Payer: Medicare Other

## 2022-08-17 ENCOUNTER — Inpatient Hospital Stay (HOSPITAL_BASED_OUTPATIENT_CLINIC_OR_DEPARTMENT_OTHER)
Admission: EM | Admit: 2022-08-17 | Discharge: 2022-08-20 | DRG: 638 | Disposition: A | Discharge status: Skilled Nursing Facility | Payer: Medicare Other | Source: Ambulatory Visit | Attending: Internal Medicine | Admitting: Internal Medicine

## 2022-08-17 ENCOUNTER — Other Ambulatory Visit (HOSPITAL_BASED_OUTPATIENT_CLINIC_OR_DEPARTMENT_OTHER): Payer: Medicare Other

## 2022-08-17 ENCOUNTER — Encounter: Payer: Self-pay | Admitting: Student

## 2022-08-17 ENCOUNTER — Encounter (HOSPITAL_BASED_OUTPATIENT_CLINIC_OR_DEPARTMENT_OTHER): Payer: Self-pay | Admitting: *Deleted

## 2022-08-17 ENCOUNTER — Encounter (HOSPITAL_COMMUNITY): Payer: Self-pay

## 2022-08-17 ENCOUNTER — Ambulatory Visit (INDEPENDENT_AMBULATORY_CARE_PROVIDER_SITE_OTHER): Payer: Medicare Other | Admitting: Student

## 2022-08-17 VITALS — BP 112/60 | HR 74 | Ht 62.0 in | Wt 97.2 lb

## 2022-08-17 DIAGNOSIS — I4819 Other persistent atrial fibrillation: Secondary | ICD-10-CM | POA: Diagnosis present

## 2022-08-17 DIAGNOSIS — Z794 Long term (current) use of insulin: Secondary | ICD-10-CM | POA: Diagnosis not present

## 2022-08-17 DIAGNOSIS — E785 Hyperlipidemia, unspecified: Secondary | ICD-10-CM

## 2022-08-17 DIAGNOSIS — Z888 Allergy status to other drugs, medicaments and biological substances status: Secondary | ICD-10-CM

## 2022-08-17 DIAGNOSIS — Z950 Presence of cardiac pacemaker: Secondary | ICD-10-CM

## 2022-08-17 DIAGNOSIS — Z96641 Presence of right artificial hip joint: Secondary | ICD-10-CM | POA: Diagnosis present

## 2022-08-17 DIAGNOSIS — I480 Paroxysmal atrial fibrillation: Secondary | ICD-10-CM | POA: Diagnosis not present

## 2022-08-17 DIAGNOSIS — Z7901 Long term (current) use of anticoagulants: Secondary | ICD-10-CM | POA: Diagnosis not present

## 2022-08-17 DIAGNOSIS — E11649 Type 2 diabetes mellitus with hypoglycemia without coma: Secondary | ICD-10-CM | POA: Diagnosis not present

## 2022-08-17 DIAGNOSIS — R918 Other nonspecific abnormal finding of lung field: Secondary | ICD-10-CM | POA: Diagnosis not present

## 2022-08-17 DIAGNOSIS — Z681 Body mass index (BMI) 19 or less, adult: Secondary | ICD-10-CM | POA: Diagnosis not present

## 2022-08-17 DIAGNOSIS — Z7984 Long term (current) use of oral hypoglycemic drugs: Secondary | ICD-10-CM

## 2022-08-17 DIAGNOSIS — I5042 Chronic combined systolic (congestive) and diastolic (congestive) heart failure: Secondary | ICD-10-CM | POA: Insufficient documentation

## 2022-08-17 DIAGNOSIS — R911 Solitary pulmonary nodule: Secondary | ICD-10-CM | POA: Diagnosis present

## 2022-08-17 DIAGNOSIS — W19XXXA Unspecified fall, initial encounter: Secondary | ICD-10-CM

## 2022-08-17 DIAGNOSIS — M6281 Muscle weakness (generalized): Secondary | ICD-10-CM | POA: Diagnosis not present

## 2022-08-17 DIAGNOSIS — E538 Deficiency of other specified B group vitamins: Secondary | ICD-10-CM | POA: Diagnosis present

## 2022-08-17 DIAGNOSIS — I2510 Atherosclerotic heart disease of native coronary artery without angina pectoris: Secondary | ICD-10-CM | POA: Diagnosis not present

## 2022-08-17 DIAGNOSIS — I472 Ventricular tachycardia, unspecified: Secondary | ICD-10-CM | POA: Diagnosis present

## 2022-08-17 DIAGNOSIS — E1110 Type 2 diabetes mellitus with ketoacidosis without coma: Secondary | ICD-10-CM | POA: Diagnosis present

## 2022-08-17 DIAGNOSIS — I110 Hypertensive heart disease with heart failure: Secondary | ICD-10-CM | POA: Diagnosis present

## 2022-08-17 DIAGNOSIS — R636 Underweight: Secondary | ICD-10-CM | POA: Diagnosis present

## 2022-08-17 DIAGNOSIS — M1990 Unspecified osteoarthritis, unspecified site: Secondary | ICD-10-CM | POA: Diagnosis present

## 2022-08-17 DIAGNOSIS — Z951 Presence of aortocoronary bypass graft: Secondary | ICD-10-CM

## 2022-08-17 DIAGNOSIS — R55 Syncope and collapse: Secondary | ICD-10-CM

## 2022-08-17 DIAGNOSIS — F09 Unspecified mental disorder due to known physiological condition: Secondary | ICD-10-CM | POA: Diagnosis not present

## 2022-08-17 DIAGNOSIS — I10 Essential (primary) hypertension: Secondary | ICD-10-CM | POA: Diagnosis not present

## 2022-08-17 DIAGNOSIS — F03911 Unspecified dementia, unspecified severity, with agitation: Secondary | ICD-10-CM | POA: Diagnosis present

## 2022-08-17 DIAGNOSIS — R9431 Abnormal electrocardiogram [ECG] [EKG]: Secondary | ICD-10-CM | POA: Diagnosis not present

## 2022-08-17 DIAGNOSIS — Z7401 Bed confinement status: Secondary | ICD-10-CM | POA: Diagnosis not present

## 2022-08-17 DIAGNOSIS — J90 Pleural effusion, not elsewhere classified: Secondary | ICD-10-CM | POA: Diagnosis not present

## 2022-08-17 DIAGNOSIS — Z809 Family history of malignant neoplasm, unspecified: Secondary | ICD-10-CM

## 2022-08-17 DIAGNOSIS — I252 Old myocardial infarction: Secondary | ICD-10-CM | POA: Diagnosis not present

## 2022-08-17 DIAGNOSIS — R531 Weakness: Secondary | ICD-10-CM

## 2022-08-17 DIAGNOSIS — Z79899 Other long term (current) drug therapy: Secondary | ICD-10-CM

## 2022-08-17 DIAGNOSIS — M4696 Unspecified inflammatory spondylopathy, lumbar region: Secondary | ICD-10-CM | POA: Diagnosis not present

## 2022-08-17 DIAGNOSIS — I5022 Chronic systolic (congestive) heart failure: Secondary | ICD-10-CM | POA: Diagnosis not present

## 2022-08-17 DIAGNOSIS — J9811 Atelectasis: Secondary | ICD-10-CM | POA: Diagnosis not present

## 2022-08-17 DIAGNOSIS — Z823 Family history of stroke: Secondary | ICD-10-CM | POA: Diagnosis not present

## 2022-08-17 DIAGNOSIS — M6259 Muscle wasting and atrophy, not elsewhere classified, multiple sites: Secondary | ICD-10-CM | POA: Diagnosis not present

## 2022-08-17 DIAGNOSIS — Z8249 Family history of ischemic heart disease and other diseases of the circulatory system: Secondary | ICD-10-CM

## 2022-08-17 DIAGNOSIS — Z9861 Coronary angioplasty status: Secondary | ICD-10-CM | POA: Diagnosis not present

## 2022-08-17 DIAGNOSIS — E1310 Other specified diabetes mellitus with ketoacidosis without coma: Principal | ICD-10-CM

## 2022-08-17 DIAGNOSIS — Z9181 History of falling: Secondary | ICD-10-CM | POA: Diagnosis not present

## 2022-08-17 DIAGNOSIS — R41841 Cognitive communication deficit: Secondary | ICD-10-CM | POA: Diagnosis not present

## 2022-08-17 DIAGNOSIS — S199XXA Unspecified injury of neck, initial encounter: Secondary | ICD-10-CM | POA: Diagnosis not present

## 2022-08-17 DIAGNOSIS — R2689 Other abnormalities of gait and mobility: Secondary | ICD-10-CM | POA: Diagnosis not present

## 2022-08-17 DIAGNOSIS — I081 Rheumatic disorders of both mitral and tricuspid valves: Secondary | ICD-10-CM | POA: Diagnosis present

## 2022-08-17 DIAGNOSIS — R5381 Other malaise: Secondary | ICD-10-CM | POA: Diagnosis not present

## 2022-08-17 DIAGNOSIS — S0990XA Unspecified injury of head, initial encounter: Secondary | ICD-10-CM | POA: Diagnosis not present

## 2022-08-17 DIAGNOSIS — I447 Left bundle-branch block, unspecified: Secondary | ICD-10-CM | POA: Diagnosis present

## 2022-08-17 DIAGNOSIS — R1311 Dysphagia, oral phase: Secondary | ICD-10-CM | POA: Diagnosis not present

## 2022-08-17 DIAGNOSIS — E1169 Type 2 diabetes mellitus with other specified complication: Secondary | ICD-10-CM | POA: Diagnosis not present

## 2022-08-17 LAB — I-STAT VENOUS BLOOD GAS, ED
Acid-base deficit: 6 mmol/L — ABNORMAL HIGH (ref 0.0–2.0)
Bicarbonate: 20.8 mmol/L (ref 20.0–28.0)
Calcium, Ion: 1.55 mmol/L (ref 1.15–1.40)
HCT: 41 % (ref 36.0–46.0)
Hemoglobin: 13.9 g/dL (ref 12.0–15.0)
O2 Saturation: 30 %
Patient temperature: 97.7
Potassium: 4.6 mmol/L (ref 3.5–5.1)
Sodium: 140 mmol/L (ref 135–145)
TCO2: 22 mmol/L (ref 22–32)
pCO2, Ven: 41.8 mmHg — ABNORMAL LOW (ref 44–60)
pH, Ven: 7.303 (ref 7.25–7.43)
pO2, Ven: 21 mmHg — CL (ref 32–45)

## 2022-08-17 LAB — COMPREHENSIVE METABOLIC PANEL
ALT: 10 U/L (ref 0–44)
AST: 17 U/L (ref 15–41)
Albumin: 4.2 g/dL (ref 3.5–5.0)
Alkaline Phosphatase: 76 U/L (ref 38–126)
Anion gap: 23 — ABNORMAL HIGH (ref 5–15)
BUN: 31 mg/dL — ABNORMAL HIGH (ref 8–23)
CO2: 16 mmol/L — ABNORMAL LOW (ref 22–32)
Calcium: 12.1 mg/dL — ABNORMAL HIGH (ref 8.9–10.3)
Chloride: 98 mmol/L (ref 98–111)
Creatinine, Ser: 0.93 mg/dL (ref 0.44–1.00)
GFR, Estimated: 59 mL/min — ABNORMAL LOW (ref >60)
Glucose, Bld: 353 mg/dL — ABNORMAL HIGH (ref 70–99)
Potassium: 5 mmol/L (ref 3.5–5.1)
Sodium: 137 mmol/L (ref 135–145)
Total Bilirubin: 1.1 mg/dL (ref 0.3–1.2)
Total Protein: 7.2 g/dL (ref 6.5–8.1)

## 2022-08-17 LAB — BASIC METABOLIC PANEL
Anion gap: 19 — ABNORMAL HIGH (ref 5–15)
Anion gap: 11 (ref 5–15)
BUN: 28 mg/dL — ABNORMAL HIGH (ref 8–23)
BUN: 21 mg/dL (ref 8–23)
CO2: 21 mmol/L — ABNORMAL LOW (ref 22–32)
CO2: 27 mmol/L (ref 22–32)
Calcium: 11.7 mg/dL — ABNORMAL HIGH (ref 8.9–10.3)
Calcium: 10.9 mg/dL — ABNORMAL HIGH (ref 8.9–10.3)
Chloride: 100 mmol/L (ref 98–111)
Chloride: 105 mmol/L (ref 98–111)
Creatinine, Ser: 0.86 mg/dL (ref 0.44–1.00)
Creatinine, Ser: 0.69 mg/dL (ref 0.44–1.00)
GFR, Estimated: >60 mL/min (ref >60)
GFR, Estimated: >60 mL/min (ref >60)
Glucose, Bld: 266 mg/dL — ABNORMAL HIGH (ref 70–99)
Glucose, Bld: 211 mg/dL — ABNORMAL HIGH (ref 70–99)
Potassium: 4.8 mmol/L (ref 3.5–5.1)
Potassium: 4.1 mmol/L (ref 3.5–5.1)
Sodium: 140 mmol/L (ref 135–145)
Sodium: 143 mmol/L (ref 135–145)

## 2022-08-17 LAB — URINALYSIS, ROUTINE W REFLEX MICROSCOPIC
Bilirubin Urine: NEGATIVE
Glucose, UA: >1000 mg/dL — AB
Hgb urine dipstick: NEGATIVE
Ketones, ur: 15 mg/dL — AB
Nitrite: NEGATIVE
Protein, ur: NEGATIVE mg/dL
Specific Gravity, Urine: 1.025 (ref 1.005–1.030)
pH: 5.5 (ref 5.0–8.0)

## 2022-08-17 LAB — BLOOD GAS, VENOUS
Acid-Base Excess: 3 mmol/L — ABNORMAL HIGH (ref 0.0–2.0)
Bicarbonate: 28.5 mmol/L — ABNORMAL HIGH (ref 20.0–28.0)
O2 Saturation: 57 %
Patient temperature: 37
pCO2, Ven: 46 mmHg (ref 44–60)
pH, Ven: 7.4 (ref 7.25–7.43)
pO2, Ven: <31 mmHg — CL (ref 32–45)

## 2022-08-17 LAB — CBG MONITORING, ED
Glucose-Capillary: 296 mg/dL — ABNORMAL HIGH (ref 70–99)
Glucose-Capillary: 233 mg/dL — ABNORMAL HIGH (ref 70–99)
Glucose-Capillary: 231 mg/dL — ABNORMAL HIGH (ref 70–99)
Glucose-Capillary: 221 mg/dL — ABNORMAL HIGH (ref 70–99)
Glucose-Capillary: 201 mg/dL — ABNORMAL HIGH (ref 70–99)
Glucose-Capillary: 204 mg/dL — ABNORMAL HIGH (ref 70–99)
Glucose-Capillary: 162 mg/dL — ABNORMAL HIGH (ref 70–99)

## 2022-08-17 LAB — CBC
HCT: 39.6 % (ref 36.0–46.0)
Hemoglobin: 12.8 g/dL (ref 12.0–15.0)
MCH: 32.5 pg (ref 26.0–34.0)
MCHC: 32.3 g/dL (ref 30.0–36.0)
MCV: 100.5 fL — ABNORMAL HIGH (ref 80.0–100.0)
Platelets: 185 10*3/uL (ref 150–400)
RBC: 3.94 MIL/uL (ref 3.87–5.11)
RDW: 14.4 % (ref 11.5–15.5)
WBC: 6.3 10*3/uL (ref 4.0–10.5)
nRBC: 0 % (ref 0.0–0.2)

## 2022-08-17 LAB — TROPONIN I (HIGH SENSITIVITY)
Troponin I (High Sensitivity): 41 ng/L — ABNORMAL HIGH (ref <18)
Troponin I (High Sensitivity): 40 ng/L — ABNORMAL HIGH (ref <18)

## 2022-08-17 LAB — BETA-HYDROXYBUTYRIC ACID
Beta-Hydroxybutyric Acid: 6.48 mmol/L — ABNORMAL HIGH (ref 0.05–0.27)
Beta-Hydroxybutyric Acid: 0.71 mmol/L — ABNORMAL HIGH (ref 0.05–0.27)

## 2022-08-17 LAB — LIPASE, BLOOD: Lipase: 12 U/L (ref 11–51)

## 2022-08-17 LAB — MAGNESIUM: Magnesium: 1.8 mg/dL (ref 1.7–2.4)

## 2022-08-17 LAB — GLUCOSE, CAPILLARY: Glucose-Capillary: 188 mg/dL — ABNORMAL HIGH (ref 70–99)

## 2022-08-17 LAB — CK: Total CK: 31 U/L — ABNORMAL LOW (ref 38–234)

## 2022-08-17 MED ORDER — POTASSIUM CHLORIDE 10 MEQ/100ML IV SOLN
10.0000 meq | INTRAVENOUS | Status: AC
  Administered 2022-08-17 (×2): 10 meq via INTRAVENOUS
  Filled 2022-08-17 (×2): qty 100

## 2022-08-17 MED ORDER — DEXTROSE 50 % IV SOLN: 0.0000 mL | INTRAVENOUS | Status: DC | PRN

## 2022-08-17 MED ORDER — INSULIN REGULAR(HUMAN) IN NACL 100-0.9 UT/100ML-% IV SOLN
INTRAVENOUS | Status: DC
  Administered 2022-08-17: 3.2 [IU]/h via INTRAVENOUS
  Filled 2022-08-17: qty 100

## 2022-08-17 MED ORDER — LACTATED RINGERS IV BOLUS
500.0000 mL | Freq: Once | INTRAVENOUS | Status: AC
  Administered 2022-08-17: 500 mL via INTRAVENOUS

## 2022-08-17 MED ORDER — DEXTROSE IN LACTATED RINGERS 5 % IV SOLN: INTRAVENOUS | Status: DC

## 2022-08-17 MED ORDER — LACTATED RINGERS IV SOLN: INTRAVENOUS | Status: DC

## 2022-08-17 NOTE — ED Triage Notes (Addendum)
Pt has been declining for a week.  Pt has not been eating or drinking and been fell Saturday and was on the floor all night because she could not get up.  Pt lives independently and walks with a rollator. Pt is alert and oriented in triage but reprorts that she feels she is having some memory problems and she also notes ruq abdominal pain

## 2022-08-17 NOTE — ED Notes (Signed)
Patient transported to CT 

## 2022-08-17 NOTE — ED Notes (Signed)
Please call [facility]) with any updates. [phone].

## 2022-08-17 NOTE — ED Provider Notes (Addendum)
[facility] DEPT Provider Note   CSN: [id number] Arrival date & time: 08/17/22  1015     History  Chief Complaint  Patient presents with   Fall   Abdominal Pain    Cynthia [name] is a 89 y.o. female.  She has past medical history of CHF with reduced EF, last echo showed EF of 55 to 60%.  She has history of diabetes, hypertension, biventricular pacemaker placement in 2022, atrial fibrillation on Eliquis.  Ports she has been feeling unwell for the past several weeks but worse in the past several days, she lives alone and is normally independent with her ADLs, manages her own medications and walks with a rollator.  She fell Saturday night walk to the bathroom, she does not know how she fell, unsure if she passed out.  She apparently laid on the floor all night because she had no backup, was assisted up by a friend the next day.  Her nephew brought her to her cardiologist this morning who recommended come to the ED for further workup and evaluation.  Her nephew reports she is also having some intermittent confusion, and has not been eating or drinking like normal.  Patient states she has had less appetite, states her sugars have been running high in the mornings despite not eating very much, dates that it is been in the 300s once.  She states she takes insulin twice a day  Fall Associated symptoms include abdominal pain.  Abdominal Pain      Home Medications Prior to Admission medications   Medication Sig Start Date End Date Taking? Authorizing Provider  amiodarone (PACERONE) 200 MG tablet Take 200 mg by mouth daily. No medication on Sunday    [provider]  amoxicillin (AMOXIL) 500 MG capsule TAKE 4 CAPSULES BY MOUTH 1 HOUR PRIOR TO PROCEDURE 04/24/22   [provider]  apixaban (ELIQUIS) 2.5 MG TABS tablet Take 1 tablet (2.5 mg total) by mouth 2 (two) times daily. 10/23/21   Fenton, Clint R, PA  atorvastatin (LIPITOR) 40 MG tablet TAKE 1 TABLET  DAILY 04/01/22   Nahser, Wonda Cheng, MD  B-D INS SYR ULTRAFINE 1CC/31G 31G X 5/16" 1 ML MISC  02/23/15   [provider]  BD PEN NEEDLE NANO U/F 32G X 4 MM MISC Inject 1 each into the skin as directed. With insulin 10/08/17   [provider]  Calcium Carb-Cholecalciferol (CALCIUM 600 + D PO) Take 1 tablet by mouth 2 (two) times daily.    [provider]  carvedilol (COREG) 6.25 MG tablet Take 6.25 mg by mouth 2 (two) times daily. 04/13/22   [provider]  Coenzyme Q10 (COQ10) 200 MG CAPS Take 200 mg by mouth every evening.    [provider]  dapagliflozin propanediol (FARXIGA) 10 MG TABS tablet Take 1 tablet (10 mg total) by mouth daily before breakfast. 05/05/22   Sabharwal, Aditya, DO  ezetimibe (ZETIA) 10 MG tablet TAKE 1 TABLET DAILY 05/07/22   Nahser, Wonda Cheng, MD  furosemide (LASIX) 20 MG tablet TAKE 1 TABLET BY MOUTH EVERY DAY 06/01/22   Nahser, Wonda Cheng, MD  insulin aspart (NOVOLOG FLEXPEN) 100 UNIT/ML FlexPen Inject 6 Units into the skin 3 (three) times daily with meals.    [provider]  insulin degludec (TRESIBA FLEXTOUCH) 100 UNIT/ML FlexTouch Pen Inject 11 Units into the skin daily.    [provider]  isosorbide mononitrate (IMDUR) 60 MG 24 hr tablet TAKE 1 TABLET BY MOUTH  EVERY DAY 06/29/22   Nahser, Wonda Cheng, MD  Magnesium 400 MG CAPS Take 400 mg by mouth daily in the afternoon.    [provider]  metoprolol succinate (TOPROL XL) 25 MG 24 hr tablet Take 1 tablet (25 mg total) by mouth daily. 12/05/21   Evans Lance, MD  Multiple Vitamin (MULTIVITAMIN) tablet Take 1 tablet by mouth every morning.    [provider]  nitroGLYCERIN (NITROSTAT) 0.4 MG SL tablet Place 1 tablet (0.4 mg total) under the tongue every 5 (five) minutes as needed for chest pain. 01/07/22 05/06/23  Baldwin Jamaica, PA-C  ondansetron (ZOFRAN) 4 MG tablet Take 4 mg by mouth 2 (two) times daily. 08/11/22   [provider]  ONE  TOUCH ULTRA TEST test strip 2 (two) times daily. test blood sugar 03/08/15   [provider]  Jonetta Speak LANCETS [address]  03/27/15   [provider]  Polyethyl Glycol-Propyl Glycol (SYSTANE OP) Apply 1 drop to eye 2 (two) times daily as needed (dry/scratchy eyes).    [provider]  sacubitril-valsartan (ENTRESTO) 49-51 MG Take 1 tablet by mouth 2 (two) times daily. 03/03/22   Nahser, Wonda Cheng, MD  sitaGLIPtin-metformin (JANUMET) 50-1000 MG tablet Take 1 tablet by mouth 2 (two) times daily with a meal. 07/30/16   End, Harrell Gave, MD  spironolactone (ALDACTONE) 25 MG tablet TAKE 1/2 TABLET BY MOUTH EVERY DAY 07/31/22   Sabharwal, Aditya, DO  vitamin C (ASCORBIC ACID) 500 MG tablet Take 500 mg by mouth every evening.    [provider]      Allergies    Hydrochlorothiazide    Review of Systems   Review of Systems  Constitutional:  Positive for appetite change.  Gastrointestinal:  Positive for abdominal pain.    Physical Exam Updated Vital Signs BP 125/60   Pulse 74   Temp 97.9 F (36.6 C) (Oral)   Resp 16   Wt 44 kg   SpO2 100%   BMI 17.74 kg/m  Physical Exam Vitals and nursing note reviewed.  Constitutional:      General: She is not in acute distress.    Appearance: She is well-developed.  HENT:     Head: Normocephalic and atraumatic.  Eyes:     Conjunctiva/sclera: Conjunctivae normal.  Cardiovascular:     Rate and Rhythm: Normal rate and regular rhythm.     Heart sounds: No murmur heard. Pulmonary:     Effort: Pulmonary effort is normal. No respiratory distress.     Breath sounds: Normal breath sounds.  Abdominal:     General: Abdomen is flat.     Palpations: Abdomen is soft.     Tenderness: There is no abdominal tenderness.  Musculoskeletal:        General: No swelling.     Cervical back: Neck supple.  Skin:    General: Skin is warm and dry.     Capillary Refill: Capillary refill takes less than 2 seconds.  Neurological:      General: No focal deficit present.     Mental Status: She is alert and oriented to person, place, and time.     Cranial Nerves: No cranial nerve deficit.     Motor: No weakness.  Psychiatric:        Mood and Affect: Mood normal.     ED Results / Procedures / Treatments   Labs (all labs ordered are listed, but only abnormal results are displayed) Labs Reviewed  CBC - Abnormal; Notable  for the following components:      Result Value   MCV 100.5 (*)    All other components within normal limits  LIPASE, BLOOD  COMPREHENSIVE METABOLIC PANEL  URINALYSIS, ROUTINE W REFLEX MICROSCOPIC  CK  MAGNESIUM    EKG None  Radiology No results found.  Procedures .Critical Care  Performed by: Gwenevere Abbot, PA-C Authorized by: Gwenevere Abbot, PA-C   Critical care provider statement:    Critical care time (minutes):  30   Critical care was necessary to treat or prevent imminent or life-threatening deterioration of the following conditions:  Metabolic crisis   Critical care was time spent personally by me on the following activities:  Development of treatment plan with patient or surrogate, discussions with consultants, evaluation of patient's response to treatment, examination of patient, ordering and review of laboratory studies, ordering and review of radiographic studies, ordering and performing treatments and interventions, pulse oximetry, re-evaluation of patient's condition and review of old charts   I assumed direction of critical care for this patient from another provider in my specialty: no     Care discussed with: admitting provider       Medications Ordered in ED Medications - No data to display  ED Course/ Medical Decision Making/ A&P                           Medical Decision Making This patient presents to the ED for concern of falls, weakness, this involves an extensive number of treatment options, and is a complaint that carries with it a high risk of  complications and morbidity.  The differential diagnosis includes PE, intracranial hemorrhage, dehydration, DKA, arrhythmia, other   Co morbidities that complicate the patient evaluation  Diabetes, CHF, atrial fibrillation   Additional history obtained:  Additional history obtained from EMR External records from outside source obtained and reviewed including patient cardiology note from today   Lab Tests:  I Ordered, and personally interpreted labs.  The pertinent results include: CMP shows hyperglycemia with a CO2 of 16 and increased anion gap of 23.  Elevated beta hydroxybutyrate   Imaging Studies ordered:  I ordered imaging studies including CT head and C-spine I independently visualized and interpreted imaging which showed no intracranial hemorrhage, no fracture or malalignment I agree with the radiologist interpretation   Cardiac Monitoring: / EKG:  The patient was maintained on a cardiac monitor.  I personally viewed and interpreted the cardiac monitored which showed an underlying rhythm of: Sinus rhythm   Consultations Obtained:  I requested consultation with the hospitalist,  and discussed lab and imaging findings as well as pertinent plan - they recommend: Admission   Problem List / ED Course / Critical interventions / Medication management  DKA-patient has been feeling generally weak, confused.  Her exam shows no focal deficits.  Imaging was unremarkable for any intracranial hemorrhage.  Her med list is up-to-date she states, she states she does not take insulin twice a day though she supposed take it 3 times a day with meals plus take long-acting which she is not sure what she is taking, only that she takes 2 shots a day.  She has been eating and drinking less.  Unsure how compliant she has been with her medications.  Patient is historian due to her recent problems with some memory issues.  She has no focal neurologic findings.  Labs are significant for increased  anion gap.  Given this she was  put on an insulin drip.  Her VBG shows pH of 7.3.  I discussed with patient given her lab findings she needs to be admitted and have her blood sugar managed with IV insulin, continue fluids and close monitoring, she was given only 500 cc fluid bolus initially due to her history of heart failure.  She had no rales on exam, no signs of pulmonary edema on chest x-ray.  Reading her possible syncope we did do a troponin was elevated but flat on repeat.  ECG was similar in appearance to previous.  Pacemaker interrogation showed no events since her last interrogation.  I ordered medication including insulin  for hyperglycemia  Reevaluation of the patient after these medicines showed that the patient improved I have reviewed the patients home medicines and have made adjustments as needed   Social Determinants of Health:  Elderly and living along, trouble with mobility       Amount and/or Complexity of Data Reviewed Labs: ordered. Radiology: ordered.  Risk Prescription drug management. Decision regarding hospitalization.           Final Clinical Impression(s) / ED Diagnoses Final diagnoses:  None    Rx / DC Orders ED Discharge Orders     None         Gwenevere Abbot, PA-C 08/17/22 1456    Gwenevere Abbot, PA-C 08/17/22 1552    Lorelle Gibbs, DO 08/17/22 1555

## 2022-08-17 NOTE — Patient Instructions (Signed)
Medication Instructions:  Your physician recommends that you continue on your current medications as directed. Please refer to the Current Medication list given to you today.  *If you need a refill on your cardiac medications before your next appointment, please call your pharmacy*   Lab Work: None ordered If you have labs (blood work) drawn today and your tests are completely normal, you will receive your results only by: [facility] (if you have MyChart) OR A paper copy in the mail If you have any lab test that is abnormal or we need to change your treatment, we will call you to review the results.   Follow-Up: At [facility], you and your health needs are our priority.  As part of our continuing mission to provide you with exceptional heart care, we have created designated Provider Care Teams.  These Care Teams include your primary Cardiologist (physician) and Advanced Practice Providers (APPs -  Physician Assistants and Nurse Practitioners) who all work together to provide you with the care you need, when you need it.   Your next appointment:   3 week(s)  The format for your next appointment:   In Person  Provider:   Mertie Moores, MD  or APP  Other Instructions Please report to the ED as discussed at visit.  Important Information About Sugar

## 2022-08-18 ENCOUNTER — Inpatient Hospital Stay (HOSPITAL_COMMUNITY): Payer: Medicare Other

## 2022-08-18 DIAGNOSIS — I4819 Other persistent atrial fibrillation: Secondary | ICD-10-CM | POA: Diagnosis not present

## 2022-08-18 DIAGNOSIS — R55 Syncope and collapse: Secondary | ICD-10-CM

## 2022-08-18 DIAGNOSIS — E1310 Other specified diabetes mellitus with ketoacidosis without coma: Secondary | ICD-10-CM

## 2022-08-18 DIAGNOSIS — E1169 Type 2 diabetes mellitus with other specified complication: Secondary | ICD-10-CM

## 2022-08-18 LAB — RETICULOCYTES
Immature Retic Fract: 19.6 % — ABNORMAL HIGH (ref 2.3–15.9)
RBC.: 3.58 MIL/uL — ABNORMAL LOW (ref 3.87–5.11)
Retic Count, Absolute: 54.1 10*3/uL (ref 19.0–186.0)
Retic Ct Pct: 1.5 % (ref 0.4–3.1)

## 2022-08-18 LAB — IRON AND TIBC
Iron: 50 ug/dL (ref 28–170)
Saturation Ratios: 22 % (ref 10.4–31.8)
TIBC: 224 ug/dL — ABNORMAL LOW (ref 250–450)
UIBC: 174 ug/dL

## 2022-08-18 LAB — BASIC METABOLIC PANEL
Anion gap: 10 (ref 5–15)
Anion gap: 12 (ref 5–15)
Anion gap: 8 (ref 5–15)
BUN: 15 mg/dL (ref 8–23)
BUN: 17 mg/dL (ref 8–23)
BUN: 18 mg/dL (ref 8–23)
CO2: 23 mmol/L (ref 22–32)
CO2: 25 mmol/L (ref 22–32)
CO2: 27 mmol/L (ref 22–32)
Calcium: 10.5 mg/dL — ABNORMAL HIGH (ref 8.9–10.3)
Calcium: 10.5 mg/dL — ABNORMAL HIGH (ref 8.9–10.3)
Calcium: 10.8 mg/dL — ABNORMAL HIGH (ref 8.9–10.3)
Chloride: 104 mmol/L (ref 98–111)
Chloride: 106 mmol/L (ref 98–111)
Chloride: 107 mmol/L (ref 98–111)
Creatinine, Ser: 0.59 mg/dL (ref 0.44–1.00)
Creatinine, Ser: 0.71 mg/dL (ref 0.44–1.00)
Creatinine, Ser: 0.73 mg/dL (ref 0.44–1.00)
GFR, Estimated: >60 mL/min (ref >60)
GFR, Estimated: >60 mL/min (ref >60)
GFR, Estimated: >60 mL/min (ref >60)
Glucose, Bld: 180 mg/dL — ABNORMAL HIGH (ref 70–99)
Glucose, Bld: 184 mg/dL — ABNORMAL HIGH (ref 70–99)
Glucose, Bld: 221 mg/dL — ABNORMAL HIGH (ref 70–99)
Potassium: 3.7 mmol/L (ref 3.5–5.1)
Potassium: 3.8 mmol/L (ref 3.5–5.1)
Potassium: 4 mmol/L (ref 3.5–5.1)
Sodium: 139 mmol/L (ref 135–145)
Sodium: 141 mmol/L (ref 135–145)
Sodium: 142 mmol/L (ref 135–145)

## 2022-08-18 LAB — LIPID PANEL
Cholesterol: 149 mg/dL (ref 0–200)
HDL: 23 mg/dL — ABNORMAL LOW (ref >40)
LDL Cholesterol: 99 mg/dL (ref 0–99)
Total CHOL/HDL Ratio: 6.5 RATIO
Triglycerides: 135 mg/dL (ref <150)
VLDL: 27 mg/dL (ref 0–40)

## 2022-08-18 LAB — GLUCOSE, CAPILLARY
Glucose-Capillary: 143 mg/dL — ABNORMAL HIGH (ref 70–99)
Glucose-Capillary: 170 mg/dL — ABNORMAL HIGH (ref 70–99)
Glucose-Capillary: 170 mg/dL — ABNORMAL HIGH (ref 70–99)
Glucose-Capillary: 182 mg/dL — ABNORMAL HIGH (ref 70–99)
Glucose-Capillary: 202 mg/dL — ABNORMAL HIGH (ref 70–99)
Glucose-Capillary: 217 mg/dL — ABNORMAL HIGH (ref 70–99)
Glucose-Capillary: 341 mg/dL — ABNORMAL HIGH (ref 70–99)

## 2022-08-18 LAB — HEMOGLOBIN A1C
Hgb A1c MFr Bld: 8.8 % — ABNORMAL HIGH (ref 4.8–5.6)
Mean Plasma Glucose: 205.86 mg/dL

## 2022-08-18 LAB — FERRITIN: Ferritin: 351 ng/mL — ABNORMAL HIGH (ref 11–307)

## 2022-08-18 LAB — VITAMIN B12: Vitamin B-12: 291 pg/mL (ref 180–914)

## 2022-08-18 LAB — FOLATE: Folate: 10 ng/mL (ref >5.9)

## 2022-08-18 LAB — BETA-HYDROXYBUTYRIC ACID: Beta-Hydroxybutyric Acid: 0.81 mmol/L — ABNORMAL HIGH (ref 0.05–0.27)

## 2022-08-18 MED ORDER — SODIUM CHLORIDE 0.9 % IV SOLN
2.0000 g | INTRAVENOUS | Status: DC
  Administered 2022-08-18: 2 g via INTRAVENOUS
  Filled 2022-08-18: qty 20

## 2022-08-18 MED ORDER — SACUBITRIL-VALSARTAN 49-51 MG PO TABS
1.0000 | ORAL_TABLET | Freq: Two times a day (BID) | ORAL | Status: DC
  Administered 2022-08-19 – 2022-08-20 (×3): 1 via ORAL
  Filled 2022-08-18 (×4): qty 1

## 2022-08-18 MED ORDER — INSULIN ASPART 100 UNIT/ML IJ SOLN
0.0000 [IU] | Freq: Three times a day (TID) | INTRAMUSCULAR | Status: DC
  Administered 2022-08-18: 1 [IU] via SUBCUTANEOUS
  Administered 2022-08-18: 3 [IU] via SUBCUTANEOUS

## 2022-08-18 MED ORDER — ENSURE ENLIVE PO LIQD
237.0000 mL | Freq: Two times a day (BID) | ORAL | Status: DC
  Administered 2022-08-18 – 2022-08-20 (×5): 237 mL via ORAL

## 2022-08-18 MED ORDER — ISOSORBIDE MONONITRATE ER 60 MG PO TB24
60.0000 mg | ORAL_TABLET | Freq: Every day | ORAL | Status: DC
  Administered 2022-08-18 – 2022-08-20 (×3): 60 mg via ORAL
  Filled 2022-08-18 (×3): qty 1

## 2022-08-18 MED ORDER — AMIODARONE HCL 200 MG PO TABS
200.0000 mg | ORAL_TABLET | ORAL | Status: DC
  Administered 2022-08-18 – 2022-08-20 (×3): 200 mg via ORAL
  Filled 2022-08-18 (×3): qty 1

## 2022-08-18 MED ORDER — HALOPERIDOL LACTATE 5 MG/ML IJ SOLN
5.0000 mg | Freq: Once | INTRAMUSCULAR | Status: AC
  Administered 2022-08-19: 5 mg via INTRAVENOUS
  Filled 2022-08-18: qty 1

## 2022-08-18 MED ORDER — SODIUM CHLORIDE 0.9 % IV SOLN: INTRAVENOUS | Status: DC

## 2022-08-18 MED ORDER — INSULIN ASPART 100 UNIT/ML IJ SOLN: 0.0000 [IU] | INTRAMUSCULAR | Status: DC

## 2022-08-18 MED ORDER — METOPROLOL SUCCINATE ER 25 MG PO TB24
25.0000 mg | ORAL_TABLET | Freq: Every day | ORAL | Status: DC
  Administered 2022-08-18 – 2022-08-20 (×3): 25 mg via ORAL
  Filled 2022-08-18 (×3): qty 1

## 2022-08-18 MED ORDER — ATORVASTATIN CALCIUM 40 MG PO TABS
40.0000 mg | ORAL_TABLET | Freq: Every day | ORAL | Status: DC
  Administered 2022-08-18 – 2022-08-20 (×3): 40 mg via ORAL
  Filled 2022-08-18 (×4): qty 1

## 2022-08-18 MED ORDER — INSULIN ASPART 100 UNIT/ML IJ SOLN
10.0000 [IU] | Freq: Once | INTRAMUSCULAR | Status: AC
  Administered 2022-08-19: 10 [IU] via SUBCUTANEOUS

## 2022-08-18 MED ORDER — INSULIN DETEMIR 100 UNIT/ML ~~LOC~~ SOLN
15.0000 [IU] | Freq: Every day | SUBCUTANEOUS | Status: DC
  Filled 2022-08-18: qty 0.15

## 2022-08-18 MED ORDER — INSULIN DETEMIR 100 UNIT/ML ~~LOC~~ SOLN
15.0000 [IU] | Freq: Once | SUBCUTANEOUS | Status: AC
  Administered 2022-08-18: 15 [IU] via SUBCUTANEOUS
  Filled 2022-08-18: qty 0.15

## 2022-08-18 MED ORDER — ADULT MULTIVITAMIN W/MINERALS CH
1.0000 | ORAL_TABLET | Freq: Every day | ORAL | Status: DC
  Administered 2022-08-18 – 2022-08-20 (×3): 1 via ORAL
  Filled 2022-08-18 (×4): qty 1

## 2022-08-18 MED ORDER — APIXABAN 2.5 MG PO TABS
2.5000 mg | ORAL_TABLET | Freq: Two times a day (BID) | ORAL | Status: DC
  Administered 2022-08-18 – 2022-08-20 (×5): 2.5 mg via ORAL
  Filled 2022-08-18 (×5): qty 1

## 2022-08-18 NOTE — Evaluation (Signed)
Occupational Therapy Evaluation Patient Details Name: Cynthia [name] MRN: [id number] DOB: [birth year] Today's Date: 08/18/2022   History of Present Illness 89 yo female prsents to [facility] on 1/8 with DKA, fall. PMH includes CAD s/p PCI to LAD April 1998 and CABG X 13 June 1997, HF, LBBB s/p biventricular pacemaker 2022, severe MVR, afib on anticoag, mild carotid artery disease, HTN, HLD, T2DM, R THA.   Clinical Impression   PTA, pt lives alone, reports typically Modified Independent with ADLs and mobility using Rollator vs cane. Pt presents now with deficits in cognition, standing balance and overall strength. Pt requires hands on assist with all standing tasks and Min A for bathroom mobility using RW. Pt requires Supervision for UB ADL and Mod A for LB ADLs. Pt demonstrating insight into current deficits and does endorse inability to manage safely at home. Currently recommend SNF rehab based on current presentation. Will continue to follow acutely.      Recommendations for follow up therapy are one component of a multi-disciplinary discharge planning process, led by the attending physician.  Recommendations may be updated based on patient status, additional functional criteria and insurance authorization.   Follow Up Recommendations  Skilled nursing-short term rehab (<3 hours/day)     Assistance Recommended at Discharge Frequent or constant Supervision/Assistance  Patient can return home with the following A little help with walking and/or transfers;A little help with bathing/dressing/bathroom;Assistance with cooking/housework;Direct supervision/assist for medications management;Direct supervision/assist for financial management;Assist for transportation;Help with stairs or ramp for entrance    Functional Status Assessment  Patient has had a recent decline in their functional status and demonstrates the ability to make significant improvements in function in a reasonable and predictable amount  of time.  Equipment Recommendations  None recommended by OT    Recommendations for Other Services       Precautions / Restrictions Precautions Precautions: Fall Restrictions Weight Bearing Restrictions: No      Mobility Bed Mobility Overal bed mobility: Needs Assistance Bed Mobility: Supine to Sit, Sit to Supine     Supine to sit: Min assist, HOB elevated Sit to supine: Min assist   General bed mobility comments: assist for trunk elevation, increased time and assist for BLE back to bed    Transfers Overall transfer level: Needs assistance Equipment used: Rolling walker (2 wheels) Transfers: Sit to/from Stand Sit to Stand: Min assist           General transfer comment: to stand from bed and low toilet, cues for DME use      Balance Overall balance assessment: Needs assistance Sitting-balance support: No upper extremity supported, Feet supported Sitting balance-Leahy Scale: Fair     Standing balance support: Bilateral upper extremity supported, During functional activity, Reliant on assistive device for balance Standing balance-Leahy Scale: Poor                             ADL either performed or assessed with clinical judgement   ADL Overall ADL's : Needs assistance/impaired Eating/Feeding: Set up   Grooming: Min guard;Standing   Upper Body Bathing: Supervision/ safety;Sitting   Lower Body Bathing: Moderate assistance;Sit to/from stand   Upper Body Dressing : Supervision/safety;Sitting   Lower Body Dressing: Moderate assistance;Sit to/from stand Lower Body Dressing Details (indicate cue type and reason): assist for mesh underwear around B feet and light assist around waist, balance in standing Toilet Transfer: Minimal assistance;Ambulation;Rolling walker (2 wheels);Regular Glass blower/designer Details (indicate cue type  and reason): assist to stand from low toilet, cues for transition from grab bar to RW handle Toileting- Clothing  Manipulation and Hygiene: Moderate assistance;Sitting/lateral lean;Sit to/from stand       Functional mobility during ADLs: Minimal assistance;Rolling walker (2 wheels);Cueing for sequencing;Cueing for safety       Vision Ability to See in Adequate Light: 0 Adequate Patient Visual Report: No change from baseline Vision Assessment?: No apparent visual deficits     Perception     Praxis      Pertinent Vitals/Pain Pain Assessment Pain Assessment: No/denies pain     Hand Dominance Right   Extremity/Trunk Assessment Upper Extremity Assessment Upper Extremity Assessment: Generalized weakness   Lower Extremity Assessment Lower Extremity Assessment: Defer to PT evaluation   Cervical / Trunk Assessment Cervical / Trunk Assessment: Normal   Communication Communication Communication: No difficulties   Cognition Arousal/Alertness: Awake/alert Behavior During Therapy: WFL for tasks assessed/performed Overall Cognitive Status: Impaired/Different from baseline Area of Impairment: Orientation, Attention, Memory, Following commands, Safety/judgement, Problem solving                 Orientation Level: Disoriented to, Situation Current Attention Level: Sustained Memory: Decreased short-term memory Following Commands: Follows one step commands consistently Safety/Judgement: Decreased awareness of safety, Decreased awareness of deficits   Problem Solving: Requires verbal cues, Requires tactile cues General Comments: Pt pleasantly confused, able to follow directions but needs consistent cues for RW use, safe sequencing but does demo problem solving for ADLs. tangential comments at times     General Comments  Nephew present and expresses some concerns regarding if pt to DC home but both appear open to rehab    Exercises     Shoulder Instructions      Home Living Family/patient expects to be discharged to:: Private residence Living Arrangements: Alone Available Help at  Discharge: Family;Available PRN/intermittently Type of Home: House Home Access: Ramped entrance     Home Layout: One level     Bathroom Shower/Tub: Teacher, early years/pre: Handicapped height     Home Equipment: Cane - single point;Grab bars - toilet;Grab bars - tub/shower;Air cabin crew (4 wheels)          Prior Functioning/Environment Prior Level of Function : Needs assist             Mobility Comments: pt reports walking with a cane or rollator ADLs Comments: pt reports someone comes in once a week to clean, and family/friends bring her food and groceries as needed but pt states she still drives some to do her errands        OT Problem List: Decreased strength;Decreased activity tolerance;Impaired balance (sitting and/or standing);Decreased cognition;Decreased safety awareness;Decreased knowledge of use of DME or AE      OT Treatment/Interventions: Therapeutic exercise;Self-care/ADL training;DME and/or AE instruction;Therapeutic activities    OT Goals(Current goals can be found in the care plan section) Acute Rehab OT Goals Patient Stated Goal: return to independence OT Goal Formulation: With patient/family Time For Goal Achievement: 09/01/22 Potential to Achieve Goals: Good  OT Frequency: Min 2X/week    Co-evaluation              AM-PAC OT "6 Clicks" Daily Activity     Outcome Measure Help from another person eating meals?: A Little Help from another person taking care of personal grooming?: A Little Help from another person toileting, which includes using toliet, bedpan, or urinal?: A Lot Help from another person bathing (including washing, rinsing, drying)?: A Lot  Help from another person to put on and taking off regular upper body clothing?: A Little Help from another person to put on and taking off regular lower body clothing?: A Lot 6 Click Score: 15   End of Session Equipment Utilized During Treatment: Rolling walker (2  wheels);Gait belt Nurse Communication: Mobility status  Activity Tolerance: Patient tolerated treatment well Patient left: in bed;with call bell/phone within reach;with bed alarm set;with family/visitor present  OT Visit Diagnosis: Other abnormalities of gait and mobility (R26.89);Unsteadiness on feet (R26.81)                Time: 3125-0871 OT Time Calculation (min): 22 min Charges:  OT General Charges $OT Visit: 1 Visit OT Evaluation $OT Eval Moderate Complexity: 1 Mod  Malachy Chamber, OTR/L Acute Rehab Services Office: [phone]   Layla Maw 08/18/2022, 2:44 PM

## 2022-08-18 NOTE — Progress Notes (Signed)
   08/18/22 1100  Spiritual Encounters  Type of Visit Initial  Care provided to: Patient  Referral source Nurse (RN/NT/LPN)  Reason for visit Advance directives   Chaplain responded to a spiritual consult for advanced directive education.  The patient, Cynthia [name], said she was interested to completing the paperwork but she would prefer to review it herself. Matthew said she was expecting to get a shower shortly so we both agreed that she would look over the paperwork later today of tomorrow and if she had any questions she would reach out.   Danice Goltz [facility]  [phone]

## 2022-08-18 NOTE — Plan of Care (Signed)
  Problem: Education: Goal: Knowledge of General Education information will improve Description: Including pain rating scale, medication(s)/side effects and non-pharmacologic comfort measures Outcome: Progressing   Problem: Clinical Measurements: Goal: Ability to maintain clinical measurements within normal limits will improve Outcome: Progressing Goal: Will remain free from infection Outcome: Progressing Goal: Diagnostic test results will improve Outcome: Progressing Goal: Respiratory complications will improve Outcome: Progressing   Problem: Activity: Goal: Risk for activity intolerance will decrease Outcome: Progressing   Problem: Nutrition: Goal: Adequate nutrition will be maintained Outcome: Progressing   Problem: Education: Goal: Knowledge of General Education information will improve Description: Including pain rating scale, medication(s)/side effects and non-pharmacologic comfort measures Outcome: Progressing   Problem: Clinical Measurements: Goal: Ability to maintain clinical measurements within normal limits will improve Outcome: Progressing Goal: Will remain free from infection Outcome: Progressing Goal: Diagnostic test results will improve Outcome: Progressing Goal: Respiratory complications will improve Outcome: Progressing   Problem: Activity: Goal: Risk for activity intolerance will decrease Outcome: Progressing   Problem: Nutrition: Goal: Adequate nutrition will be maintained Outcome: Progressing

## 2022-08-18 NOTE — Progress Notes (Addendum)
PROGRESS NOTE        PATIENT DETAILS Name: Cynthia [name] Age: 89 y.o. Sex: female Date of Birth: 07-25-1932 Admit Date: 08/17/2022 Admitting Physician Marcelyn Bruins, MD PXT:GGYIR, Annie Main, MD  Brief Summary: Patient is a 89 y.o.  female with history of chronic HFrEF, s/p PCI/CABG in 1998, PAF on anticoagulation, permanent pacemaker implantation-who presented with mechanical fall versus syncope-was found to be in DKA and subsequently admitted to the hospitalist service.  Significant events: 1/8>> admit to [facility]-syncope versus mechanical fall, DKA.  Significant studies: 1/8>> CT head: No acute intracranial abnormality. 1/8>> CT C-spine: No fracture/dislocation 1/9>> CT chest: Bilateral solid pulmonary nodules-repeat CT chest-18-24 months.  Significant microbiology data: None  Procedures: None  Consults: None  Subjective: Lying comfortably in bed-denies any chest pain or shortness of breath.  Objective: Vitals: Blood pressure 109/65, pulse 94, temperature 97.8 F (36.6 C), temperature source Oral, resp. rate 15, height '5\' 2"'$  (1.575 m), weight 44.6 kg, SpO2 96 %.   Exam: Gen Exam:Alert awake-not in any distress HEENT:atraumatic, normocephalic Chest: B/L clear to auscultation anteriorly CVS:S1S2 regular Abdomen:soft non tender, non distended Extremities:no edema Neurology: Non focal Skin: no rash  Pertinent Labs/Radiology:    Latest Ref Rng & Units 08/17/2022    2:45 PM 08/17/2022   11:10 AM 12/12/2021    3:56 PM  CBC  WBC 4.0 - 10.5 K/uL  6.3  5.3   Hemoglobin 12.0 - 15.0 g/dL 13.9  12.8  11.8   Hematocrit 36.0 - 46.0 % 41.0  39.6  35.9   Platelets 150 - 400 K/uL  185  120     Lab Results  Component Value Date   NA 141 08/18/2022   K 3.7 08/18/2022   CL 106 08/18/2022   CO2 25 08/18/2022      Assessment/Plan: DKA Resolved with IV insulin/IVF Now on SQ insulin  Syncope versus mechanical fall Apparently patient ended up on  the floor this past Saturday night-she has no recollection how she got to the floor. Will see if we could get cardiology to interrogate PPM.  Telemetry negative overnight. Neuroimaging negative. Mobilize with PT/OT Check orthostatics  Addendum: Per cards-Patricia Trent-PPM interrogated-no arrhythmias seen.   Asymptomatic Bacteruria No symp of UTI Stop Rocephin-follow clinically  DM-2 (A1c 8.8 on 1/9) No longer in DKA Follow CBGs on Levemir 15 units daily and SSI  Recent Labs    08/18/22 0140 08/18/22 0302 08/18/22 0442  GLUCAP 182* 170* 217*    Chronic HFrEF Euvolemic Resume beta-blocker/Entresto/Imdur Check orthostatic vital signs-if stable should be okay to resume usual diuretic regimen soon.  Persistent atrial fibrillation Paced rhythm Continue amiodarone/Toprol/Eliquis  History of biventricular pacemaker placement Telemetry monitoring  CAD s/p CABG/PCI No chest pain/shortness of breath On beta-blocker/statin-suspect not on aspirin as on Eliquis.  Moderate to severe mitral valve regurgitation Continue outpatient follow-up with cardiology  Lung nodule Repeat CT chest 18-24 months per radiology recommendation  BMI: Estimated body mass index is 17.98 kg/m as calculated from the following:   Height as of this encounter: '5\' 2"'$  (1.575 m).   Weight as of this encounter: 44.6 kg.   Code status:   Code Status: Full Code   DVT Prophylaxis: apixaban (ELIQUIS) tablet 2.5 mg Start: 08/18/22 1000 apixaban (ELIQUIS) tablet 2.5 mg    Family Communication: None at bedside   Disposition Plan: Status is: Inpatient  Remains inpatient appropriate because: Severity of illness.   Planned Discharge Destination:Home health   Diet: Diet Order             Diet heart healthy/carb modified Room service appropriate? Yes; Fluid consistency: Thin  Diet effective now                     Antimicrobial agents: Anti-infectives (From admission, onward)    Start      Dose/Rate Route Frequency Ordered Stop   08/18/22 0600  cefTRIAXone (ROCEPHIN) 2 g in sodium chloride 0.9 % 100 mL IVPB        2 g 200 mL/hr over 30 Minutes Intravenous Every 24 hours 08/18/22 0533          MEDICATIONS: Scheduled Meds:  amiodarone  200 mg Oral Once per day on Mon Tue Wed Thu Fri Sat   apixaban  2.5 mg Oral BID   insulin aspart  0-24 Units Subcutaneous Q4H   [START ON 08/19/2022] insulin detemir  15 Units Subcutaneous Daily   Continuous Infusions:  sodium chloride 50 mL/hr at 08/18/22 0602   cefTRIAXone (ROCEPHIN)  IV 2 g (08/18/22 0556)   PRN Meds:.dextrose   I have personally reviewed following labs and imaging studies  LABORATORY DATA: CBC: Recent Labs  Lab 08/17/22 1110 08/17/22 1445  WBC 6.3  --   HGB 12.8 13.9  HCT 39.6 41.0  MCV 100.5*  --   PLT 185  --     Basic Metabolic Panel: Recent Labs  Lab 08/17/22 1110 08/17/22 1440 08/17/22 1445 08/17/22 2130 08/17/22 2317 08/18/22 0037 08/18/22 0517  NA 137 140 140 143 139 142 141  K 5.0 4.8 4.6 4.1 4.0 3.8 3.7  CL 98 100  --  105 104 107 106  CO2 16* 21*  --  '27 23 27 25  '$ GLUCOSE 353* 266*  --  211* 184* 180* 221*  BUN 31* 28*  --  '21 18 17 15  '$ CREATININE 0.93 0.86  --  0.69 0.73 0.71 0.59  CALCIUM 12.1* 11.7*  --  10.9* 10.5* 10.8* 10.5*  MG 1.8  --   --   --   --   --   --     GFR: Estimated Creatinine Clearance: 34.2 mL/min (by C-G formula based on SCr of 0.59 mg/dL).  Liver Function Tests: Recent Labs  Lab 08/17/22 1110  AST 17  ALT 10  ALKPHOS 76  BILITOT 1.1  PROT 7.2  ALBUMIN 4.2   Recent Labs  Lab 08/17/22 1110  LIPASE 12   No results for input(s): "AMMONIA" in the last 168 hours.  Coagulation Profile: No results for input(s): "INR", "PROTIME" in the last 168 hours.  Cardiac Enzymes: Recent Labs  Lab 08/17/22 1110  CKTOTAL 31*    BNP (last 3 results) No results for input(s): "PROBNP" in the last 8760 hours.  Lipid Profile: No results for input(s):  "CHOL", "HDL", "LDLCALC", "TRIG", "CHOLHDL", "LDLDIRECT" in the last 72 hours.  Thyroid Function Tests: No results for input(s): "TSH", "T4TOTAL", "FREET4", "T3FREE", "THYROIDAB" in the last 72 hours.  Anemia Panel: Recent Labs    08/18/22 0517 08/18/22 0610  FERRITIN  --  351*  TIBC  --  224*  IRON  --  50  RETICCTPCT 1.5  --     Urine analysis:    Component Value Date/Time   COLORURINE YELLOW 08/17/2022 [place] 08/17/2022 1034   LABSPEC 1.025 08/17/2022 1034   PHURINE  5.5 08/17/2022 1034   GLUCOSEU >1,000 (A) 08/17/2022 1034   HGBUR NEGATIVE 08/17/2022 1034   BILIRUBINUR NEGATIVE 08/17/2022 1034   KETONESUR 15 (A) 08/17/2022 1034   PROTEINUR NEGATIVE 08/17/2022 1034   UROBILINOGEN 0.2 03/06/2015 1013   NITRITE NEGATIVE 08/17/2022 1034   LEUKOCYTESUR SMALL (A) 08/17/2022 1034    Sepsis Labs: Lactic Acid, Venous No results found for: "LATICACIDVEN"  MICROBIOLOGY: No results found for this or any previous visit (from the past 240 hour(s)).  RADIOLOGY STUDIES/RESULTS: CT CHEST WO CONTRAST  Result Date: 08/18/2022 CLINICAL DATA:  Follow-up abnormal chest x-ray EXAM: CT CHEST WITHOUT CONTRAST TECHNIQUE: Multidetector CT imaging of the chest was performed following the standard protocol without IV contrast. RADIATION DOSE REDUCTION: This exam was performed according to the departmental dose-optimization program which includes automated exposure control, adjustment of the mA and/or kV according to patient size and/or use of iterative reconstruction technique. COMPARISON:  Chest x-ray dated August 17, 2022 FINDINGS: Cardiovascular: Normal heart size. No pericardial effusion. Mitral annular calcifications. Severe left main and three-vessel coronary artery calcifications status post CABG. Normal caliber thoracic aorta with severe calcified plaque. Mediastinum/Nodes: Small hiatal hernia. Thyroid is unremarkable. No pathologically enlarged lymph nodes seen in the  chest. Lungs/Pleura: Central airways are patent. Trace bilateral pleural effusions and bibasilar atelectasis. Bilateral solid pulmonary nodules. Largest is a solid pulmonary nodule left lower lobe measuring 6 mm on series 4, image 107. Nodular opacity of the right lower lung described on prior chest x-ray favored to be due to overlapping osseous and vascular shadows. Upper Abdomen: Moderate thickening of the partially visualized left adrenal gland. Musculoskeletal: No chest wall mass or suspicious bone lesions identified. IMPRESSION: 1. Nodular opacity of the right lower lung described on prior chest x-ray favored to be due to overlapping osseous and vascular shadows. 2. Bilateral solid pulmonary nodules, largest is a solid pulmonary nodule of the left lower lobe measuring 6 mm. Non-contrast chest CT at 3-6 months is recommended. If the nodules are stable at time of repeat CT, then future CT at 18-24 months (from today's scan) is considered optional for low-risk patients, but is recommended for high-risk patients. This recommendation follows the consensus statement: Guidelines for Management of Incidental Pulmonary Nodules Detected on CT Images: From the Fleischner Society 2017; Radiology 2017; 284:228-243. 3. Moderate thickening of the partially visualized left. Consider dedicated adrenal protocol CT exclude underlying adrenal nodules. This can be performed non emergently. 4. Aortic Atherosclerosis (ICD10-I70.0). Electronically Signed   By: Yetta Glassman M.D.   On: 08/18/2022 09:00   CT Head Wo Contrast  Result Date: 08/17/2022 CLINICAL DATA:  Head and neck trauma. EXAM: CT HEAD WITHOUT CONTRAST CT CERVICAL SPINE WITHOUT CONTRAST TECHNIQUE: Multidetector CT imaging of the head and cervical spine was performed following the standard protocol without intravenous contrast. Multiplanar CT image reconstructions of the cervical spine were also generated. RADIATION DOSE REDUCTION: This exam was performed according  to the departmental dose-optimization program which includes automated exposure control, adjustment of the mA and/or kV according to patient size and/or use of iterative reconstruction technique. COMPARISON:  None Available. FINDINGS: CT HEAD FINDINGS Brain: No evidence of acute infarction, hemorrhage, hydrocephalus, extra-axial collection or mass lesion/mass effect. Mild generalized cerebral atrophy and chronic microvascular ischemic changes of the white matter. Vascular: No hyperdense vessel or unexpected calcification. Skull: Normal. Negative for fracture or focal lesion. Sinuses/Orbits: No acute finding. Other: None. CT CERVICAL SPINE FINDINGS Alignment: Mild retrolisthesis at C4. Mild anterolisthesis at C7. Straightening of the cervical  spine. Skull base and vertebrae: No acute fracture. No primary bone lesion or focal pathologic process. Soft tissues and spinal canal: No prevertebral fluid or swelling. No visible canal hematoma. Disc levels: Multilevel degenerate disc disease with disc height loss and small marginal osteophytes. C2-C3: Uncovertebral joint arthropathy with mild left neural foraminal stenosis. C3-C4: Disc height loss and uncovertebral joint arthropathy with mild left and moderate right neural foraminal stenosis. C4-C5: Disc height loss and uncovertebral joint arthropathy with moderate bilateral neural foraminal stenosis. C5-C6: Disc height loss and uncovertebral joint arthropathy with moderate left and severe right neural foraminal stenosis. C6-C7: Disc height loss with uncovertebral joint arthropathy and mild right and moderate left neural foraminal stenosis. C7-T1: Bilateral facet joint arthropathy. No significant spinal canal or neural foraminal stenosis. Upper chest: Biapical pleural/parenchymal scarring. Other: None IMPRESSION: CT HEAD: 1. No acute intracranial abnormality. 2. Mild generalized cerebral atrophy and chronic microvascular ischemic changes of the white matter. CT CERVICAL  SPINE: 1. No acute fracture or traumatic subluxation. 2. Multilevel degenerate disc disease with disc height loss and uncovertebral joint arthropathy. Electronically Signed   By: Keane Police D.O.   On: 08/17/2022 12:07   CT Cervical Spine Wo Contrast  Result Date: 08/17/2022 CLINICAL DATA:  Head and neck trauma. EXAM: CT HEAD WITHOUT CONTRAST CT CERVICAL SPINE WITHOUT CONTRAST TECHNIQUE: Multidetector CT imaging of the head and cervical spine was performed following the standard protocol without intravenous contrast. Multiplanar CT image reconstructions of the cervical spine were also generated. RADIATION DOSE REDUCTION: This exam was performed according to the departmental dose-optimization program which includes automated exposure control, adjustment of the mA and/or kV according to patient size and/or use of iterative reconstruction technique. COMPARISON:  None Available. FINDINGS: CT HEAD FINDINGS Brain: No evidence of acute infarction, hemorrhage, hydrocephalus, extra-axial collection or mass lesion/mass effect. Mild generalized cerebral atrophy and chronic microvascular ischemic changes of the white matter. Vascular: No hyperdense vessel or unexpected calcification. Skull: Normal. Negative for fracture or focal lesion. Sinuses/Orbits: No acute finding. Other: None. CT CERVICAL SPINE FINDINGS Alignment: Mild retrolisthesis at C4. Mild anterolisthesis at C7. Straightening of the cervical spine. Skull base and vertebrae: No acute fracture. No primary bone lesion or focal pathologic process. Soft tissues and spinal canal: No prevertebral fluid or swelling. No visible canal hematoma. Disc levels: Multilevel degenerate disc disease with disc height loss and small marginal osteophytes. C2-C3: Uncovertebral joint arthropathy with mild left neural foraminal stenosis. C3-C4: Disc height loss and uncovertebral joint arthropathy with mild left and moderate right neural foraminal stenosis. C4-C5: Disc height loss and  uncovertebral joint arthropathy with moderate bilateral neural foraminal stenosis. C5-C6: Disc height loss and uncovertebral joint arthropathy with moderate left and severe right neural foraminal stenosis. C6-C7: Disc height loss with uncovertebral joint arthropathy and mild right and moderate left neural foraminal stenosis. C7-T1: Bilateral facet joint arthropathy. No significant spinal canal or neural foraminal stenosis. Upper chest: Biapical pleural/parenchymal scarring. Other: None IMPRESSION: CT HEAD: 1. No acute intracranial abnormality. 2. Mild generalized cerebral atrophy and chronic microvascular ischemic changes of the white matter. CT CERVICAL SPINE: 1. No acute fracture or traumatic subluxation. 2. Multilevel degenerate disc disease with disc height loss and uncovertebral joint arthropathy. Electronically Signed   By: Keane Police D.O.   On: 08/17/2022 12:07   DG Chest 1 View  Result Date: 08/17/2022 CLINICAL DATA:  [place]ala[place] EXAM: CHEST  1 VIEW COMPARISON:  Chest x-ray dated December 12th 2022 FINDINGS: Cardiac and mediastinal contours are unchanged post median sternotomy  and CABG. Left chest wall biventricular pacer with leads unchanged in position. New nodular opacity of the lower right lung, likely due to overlapping osseous and vascular shadows. Lungs are otherwise clear. Pleural effusion or pneumothorax. IMPRESSION: New nodular opacity of the lower right lung, likely due to overlapping osseous and vascular shadows, although pulmonary nodule can not be excluded. Recommend PA and lateral chest x-ray for further evaluation, and if finding persists CT is recommended for further evaluation. Lungs are otherwise clear. Electronically Signed   By: Yetta Glassman M.D.   On: 08/17/2022 11:45     LOS: 1 day   Oren Binet, MD  [facility]    To contact the attending provider between 7A-7P or the covering provider during after hours 7P-7A, please log into the web site [email or web address] and  access using universal [place] password for that web site. If you do not have the password, please call the hospital operator.  08/18/2022, 9:57 AM

## 2022-08-18 NOTE — H&P (Addendum)
History and Physical    ADAYA GARMANY [name]:[id number] DOB: 1931/10/28 DOA: 08/17/2022  PCP: Reynold Bowen, MD  Patient coming from: Cynthia [name]  I have personally briefly reviewed patient's old medical records in [place]  Chief Complaint: low appetite x 1 week with fall and period of immoblization  HPI: Cynthia [name] is a 89 y.o. female with medical history significant of   CAD s/p PCI to LAD April 1998 and CABG X 13 June 1997, chronic combined systolic and diastolic heart failure with LBBB s/p biventricular pacemaker, severe mitral valve regurgitation, palpitations/persistent atrial fibrillation on anticoagulation, mild carotid artery disease, hypertension, hyperlipidemia, T2DM  who presents to Ed with history of low appetite and not feeling well x 1 week who had fall night prior to presentation. ON evaluation patient was found to have DKA. Per patient she does not reminder the events but notes she must have passed out, as she remember being in bed and then waking in in her hall way. She notes no current injuries but does not significant fatigue and weakness. Prior to this event, no n/v/d/abdominal pain but did not generalized malaise and generalized weakness/low energy.  ED Course:  Vitals afeb, bp 131/75, Hr 68, rr 19 sat 100%  Labs  UA: +glu  wbc 11-20 , +baceria , +LE Wbc 6.3, hgb 12.8, mcv 100.5 plt 185  Lipase12  CK 31 Mag 1.8  Ce 41,40 Cxr:New nodular opacity of the lower right lung, likely due to overlapping osseous and vascular shadows, although pulmonary nodule can not be excluded. Recommend PA and lateral chest x-ray for further evaluation, and if finding persists CT is recommended for further evaluation. Lungs are otherwise clear.  Na 137, K 5, bicarb 16 AG 23 glu 353cr 0.93 Vbg:7.3/41 Beta hydroxy 6.48-0.71 EKG sinus  nonspecific IVCD, QT 618  Tx lr, insulin drip ,potassium  Review of Systems: As per HPI otherwise 10 point review of systems negative.    Past Medical History:  Diagnosis Date   Anxiety    Arthritis    Chronic HFrEF (heart failure with reduced ejection fraction) (HCC)    Coronary artery disease    STATUS POST PCI AND LATER CABG   Diabetes mellitus    Dyslipidemia    History of chicken pox    History of echocardiogram    Echo 09/06/2022: mild LVH, EF 55-60, no RWMA, Gr 1 DD, MAC, mild MR, mild LAE, mild TR, PASP 31   Hypertension    Hypomagnesemia    Hyponatremia    Measles    hx of    Mitral regurgitation    Mumps    hx of    Myocardial infarction (HCC)    NSVT (nonsustained ventricular tachycardia) (HCC)    PAF (paroxysmal atrial fibrillation) (HCC)    Palpitations    PVC's (premature ventricular contractions)    Thrombocytopenia (HCC)    Tricuspid regurgitation     Past Surgical History:  Procedure Laterality Date   BIV PACEMAKER INSERTION CRT-P N/A 07/21/2021   Procedure: BIV PACEMAKER INSERTION CRT-P;  Surgeon: Evans Lance, MD;  Location: [place];  Service: Cardiovascular;  Laterality: N/A;   CARDIAC CATHETERIZATION  06/06/97   IT REVEALA MILD TO MODERATE LEFT VENTRICULAR SYSTOLIC DYSFUNCTION  WITH EF OF 40%. THERE IS PERSISTENT AKINESIS OF THE INFERIOR WALL. THERE IS MILD REGURGITATION PRESENT. THERE IS MILD HYPOKINESIS OF THE ANTERIOR  AND APEX WALL   CARDIAC CATHETERIZATION N/A 07/27/2016   Procedure: Left Heart Cath and  Cors/Grafts Angiography;  Surgeon: Nelva Bush, MD;  Location: [place];  Service: Cardiovascular;  Laterality: N/A;   CARDIOVERSION N/A 12/17/2021   Procedure: CARDIOVERSION;  Surgeon: Jerline Pain, MD;  Location: [facility];  Service: Cardiovascular;  Laterality: N/A;   CORONARY ARTERY BYPASS GRAFT     TONSILLECTOMY     TOTAL HIP ARTHROPLASTY Right 03/12/2015   Procedure: RIGHT TOTAL HIP ARTHROPLASTY ANTERIOR APPROACH;  Surgeon: Paralee Cancel, MD;  Location: [facility];  Service: Orthopedics;  Laterality: Right;     reports that she has never smoked. She has  never used smokeless tobacco. She reports that she does not drink alcohol and does not use drugs.  Allergies  Allergen Reactions   Hydrochlorothiazide     Other reaction(s): hyponatremia    Family History  Problem Relation Age of Onset   Heart attack Mother    Stroke Father    Cancer Father     Prior to Admission medications   Medication Sig Start Date End Date Taking? Authorizing Provider  amiodarone (PACERONE) 200 MG tablet Take 200 mg by mouth daily. No medication on Sunday    [provider]  amoxicillin (AMOXIL) 500 MG capsule TAKE 4 CAPSULES BY MOUTH 1 HOUR PRIOR TO PROCEDURE 04/24/22   [provider]  apixaban (ELIQUIS) 2.5 MG TABS tablet Take 1 tablet (2.5 mg total) by mouth 2 (two) times daily. 10/23/21   Fenton, Clint R, PA  atorvastatin (LIPITOR) 40 MG tablet TAKE 1 TABLET DAILY 04/01/22   Nahser, Wonda Cheng, MD  B-D INS SYR ULTRAFINE 1CC/31G 31G X 5/16" 1 ML MISC  02/23/15   [provider]  BD PEN NEEDLE NANO U/F 32G X 4 MM MISC Inject 1 each into the skin as directed. With insulin 10/08/17   [provider]  Calcium Carb-Cholecalciferol (CALCIUM 600 + D PO) Take 1 tablet by mouth 2 (two) times daily.    [provider]  carvedilol (COREG) 6.25 MG tablet Take 6.25 mg by mouth 2 (two) times daily. 04/13/22   [provider]  Coenzyme Q10 (COQ10) 200 MG CAPS Take 200 mg by mouth every evening.    [provider]  dapagliflozin propanediol (FARXIGA) 10 MG TABS tablet Take 1 tablet (10 mg total) by mouth daily before breakfast. 05/05/22   Sabharwal, Aditya, DO  ezetimibe (ZETIA) 10 MG tablet TAKE 1 TABLET DAILY 05/07/22   Nahser, Wonda Cheng, MD  furosemide (LASIX) 20 MG tablet TAKE 1 TABLET BY MOUTH EVERY DAY 06/01/22   Nahser, Wonda Cheng, MD  insulin aspart (NOVOLOG FLEXPEN) 100 UNIT/ML FlexPen Inject 6 Units into the skin 3 (three) times daily with meals.    [provider]  insulin degludec (TRESIBA FLEXTOUCH) 100  UNIT/ML FlexTouch Pen Inject 11 Units into the skin daily.    [provider]  isosorbide mononitrate (IMDUR) 60 MG 24 hr tablet TAKE 1 TABLET BY MOUTH EVERY DAY 06/29/22   Nahser, Wonda Cheng, MD  Magnesium 400 MG CAPS Take 400 mg by mouth daily in the afternoon.    [provider]  metoprolol succinate (TOPROL XL) 25 MG 24 hr tablet Take 1 tablet (25 mg total) by mouth daily. 12/05/21   Evans Lance, MD  Multiple Vitamin (MULTIVITAMIN) tablet Take 1 tablet by mouth every morning.    [provider]  nitroGLYCERIN (NITROSTAT) 0.4 MG SL tablet Place 1 tablet (0.4 mg total) under the tongue every 5 (five) minutes as needed for chest pain. 01/07/22 05/06/23  Baldwin Jamaica, PA-C  ondansetron (ZOFRAN) 4 MG tablet Take 4 mg by mouth 2 (two) times daily. 08/11/22   [provider]  ONE TOUCH ULTRA TEST test strip 2 (two) times daily. test blood sugar 03/08/15   [provider]  Jonetta Speak LANCETS 53I [place]  03/27/15   [provider]  Polyethyl Glycol-Propyl Glycol (SYSTANE OP) Apply 1 drop to eye 2 (two) times daily as needed (dry/scratchy eyes).    [provider]  sacubitril-valsartan (ENTRESTO) 49-51 MG Take 1 tablet by mouth 2 (two) times daily. 03/03/22   Nahser, Wonda Cheng, MD  sitaGLIPtin-metformin (JANUMET) 50-1000 MG tablet Take 1 tablet by mouth 2 (two) times daily with a meal. 07/30/16   End, Harrell Gave, MD  spironolactone (ALDACTONE) 25 MG tablet TAKE 1/2 TABLET BY MOUTH EVERY DAY 07/31/22   Sabharwal, Aditya, DO  vitamin C (ASCORBIC ACID) 500 MG tablet Take 500 mg by mouth every evening.    [provider]    Physical Exam: Vitals:   08/17/22 2245 08/17/22 2300 08/17/22 2317 08/18/22 0400  BP: (!) 133/59  129/80 109/65  Pulse: 66  69 94  Resp: _0 Temp: 98.7 F (37.1 C)  98.8 F (37.1 C) 97.8 F (36.6 C)  TempSrc: Oral  Oral Oral  SpO2: 93%  96% 96%  Weight:  44.6 kg    Height:  _1  (1.575 m)       Constitutional: NAD, calm, comfortable  Eyes: PERRL, lids and conjunctivae normal ENMT: Mucous membranes are moist. Posterior pharynx clear of any exudate or lesions.Normal dentition.  Neck: normal, supple, no masses, no thyromegaly Respiratory: clear to auscultation bilaterally, no wheezing, no crackles. Normal respiratory effort. No accessory muscle use.  Cardiovascular: Regular rate and rhythm, no murmurs / rubs / gallops. No extremity edema. 2+ pedal pulses. No carotid bruits.  Abdomen: no tenderness, no masses palpated. No hepatosplenomegaly. Bowel sounds positive.  Musculoskeletal: no clubbing / cyanosis. No joint deformity upper and lower extremities. Good ROM, no contractures. Normal muscle tone.  Skin: no rashes, lesions, ulcers. No induration Neurologic: CN 2-12 grossly intact. Sensation intact,. Strength 5/5 in all 4.  Psychiatric: Normal judgment and insight. Alert and oriented x 3. Normal mood.    Labs on Admission: I have personally reviewed following labs and imaging studies  CBC: Recent Labs  Lab 08/17/22 1110 08/17/22 1445  WBC 6.3  --   HGB 12.8 13.9  HCT 39.6 41.0  MCV 100.5*  --   PLT 185  --    Basic Metabolic Panel: Recent Labs  Lab 08/17/22 1110 08/17/22 1440 08/17/22 1445 08/17/22 2130 08/17/22 2317 08/18/22 0037  NA 137 140 140 143 139 142  K 5.0 4.8 4.6 4.1 4.0 3.8  CL 98 100  --  105 104 107  CO2 16* 21*  --  _2 GLUCOSE 353* 266*  --  211* 184* 180*  BUN 31* 28*  --  _3 CREATININE 0.93 0.86  --  0.69 0.73 0.71  CALCIUM 12.1* 11.7*  --  10.9* 10.5* 10.8*  MG 1.8  --   --   --   --   --    GFR: Estimated Creatinine Clearance: 34.2 mL/min (by C-G formula based on SCr of 0.71 mg/dL). Liver Function Tests: Recent Labs  Lab 08/17/22 1110  AST 17  ALT 10  ALKPHOS 76  BILITOT 1.1  PROT 7.2  ALBUMIN 4.2   Recent Labs  Lab 08/17/22 1110  LIPASE 12   No results for input(s): "AMMONIA" in the last 168  hours. Coagulation Profile: No results for input(s): "INR", "PROTIME" in the last 168 hours. Cardiac Enzymes: Recent Labs  Lab 08/17/22 1110  CKTOTAL 31*   BNP (last 3 results) No results for input(s): "PROBNP" in the last 8760 hours. HbA1C: No results for input(s): "HGBA1C" in the last 72 hours. CBG: Recent Labs  Lab 08/17/22 2244 08/18/22 0020 08/18/22 0140 08/18/22 0302 08/18/22 0442  GLUCAP 188* 170* 182* 170* 217*   Lipid Profile: No results for input(s): "CHOL", "HDL", "LDLCALC", "TRIG", "CHOLHDL", "LDLDIRECT" in the last 72 hours. Thyroid Function Tests: No results for input(s): "TSH", "T4TOTAL", "FREET4", "T3FREE", "THYROIDAB" in the last 72 hours. Anemia Panel: No results for input(s): "VITAMINB12", "FOLATE", "FERRITIN", "TIBC", "IRON", "RETICCTPCT" in the last 72 hours. Urine analysis:    Component Value Date/Time   COLORURINE YELLOW 08/17/2022 1034   APPEARANCEUR CLEAR 08/17/2022 1034   LABSPEC 1.025 08/17/2022 1034   PHURINE 5.5 08/17/2022 1034   GLUCOSEU >1,000 (A) 08/17/2022 1034   HGBUR NEGATIVE 08/17/2022 1034   BILIRUBINUR NEGATIVE 08/17/2022 1034   KETONESUR 15 (A) 08/17/2022 1034   PROTEINUR NEGATIVE 08/17/2022 1034   UROBILINOGEN 0.2 03/06/2015 1013   NITRITE NEGATIVE 08/17/2022 1034   LEUKOCYTESUR SMALL (A) 08/17/2022 1034    Radiological Exams on Admission: CT Head Wo Contrast  Result Date: 08/17/2022 CLINICAL DATA:  Head and neck trauma. EXAM: CT HEAD WITHOUT CONTRAST CT CERVICAL SPINE WITHOUT CONTRAST TECHNIQUE: Multidetector CT imaging of the head and cervical spine was performed following the standard protocol without intravenous contrast. Multiplanar CT image reconstructions of the cervical spine were also generated. RADIATION DOSE REDUCTION: This exam was performed according to the departmental dose-optimization program which includes automated exposure control, adjustment of the mA and/or kV according to patient size and/or use of iterative  reconstruction technique. COMPARISON:  None Available. FINDINGS: CT HEAD FINDINGS Brain: No evidence of acute infarction, hemorrhage, hydrocephalus, extra-axial collection or mass lesion/mass effect. Mild generalized cerebral atrophy and chronic microvascular ischemic changes of the white matter. Vascular: No hyperdense vessel or unexpected calcification. Skull: Normal. Negative for fracture or focal lesion. Sinuses/Orbits: No acute finding. Other: None. CT CERVICAL SPINE FINDINGS Alignment: Mild retrolisthesis at C4. Mild anterolisthesis at C7. Straightening of the cervical spine. Skull base and vertebrae: No acute fracture. No primary bone lesion or focal pathologic process. Soft tissues and spinal canal: No prevertebral fluid or swelling. No visible canal hematoma. Disc levels: Multilevel degenerate disc disease with disc height loss and small marginal osteophytes. C2-C3: Uncovertebral joint arthropathy with mild left neural foraminal stenosis. C3-C4: Disc height loss and uncovertebral joint arthropathy with mild left and moderate right neural foraminal stenosis. C4-C5: Disc height loss and uncovertebral joint arthropathy with moderate bilateral neural foraminal stenosis. C5-C6: Disc height loss and uncovertebral joint arthropathy with moderate left and severe right neural foraminal stenosis. C6-C7: Disc height loss with uncovertebral joint arthropathy and mild right and moderate left neural foraminal stenosis. C7-T1: Bilateral facet joint arthropathy. No significant spinal canal or neural foraminal stenosis. Upper chest: Biapical pleural/parenchymal scarring. Other: None IMPRESSION: CT HEAD: 1. No acute intracranial abnormality. 2. Mild generalized cerebral atrophy and chronic microvascular ischemic changes of the white matter. CT CERVICAL SPINE: 1. No acute fracture or traumatic subluxation. 2. Multilevel degenerate disc disease with disc height loss and uncovertebral joint arthropathy. Electronically Signed    By: Keane Police D.O.   On: 08/17/2022 12:07   CT  Cervical Spine Wo Contrast  Result Date: 08/17/2022 CLINICAL DATA:  Head and neck trauma. EXAM: CT HEAD WITHOUT CONTRAST CT CERVICAL SPINE WITHOUT CONTRAST TECHNIQUE: Multidetector CT imaging of the head and cervical spine was performed following the standard protocol without intravenous contrast. Multiplanar CT image reconstructions of the cervical spine were also generated. RADIATION DOSE REDUCTION: This exam was performed according to the departmental dose-optimization program which includes automated exposure control, adjustment of the mA and/or kV according to patient size and/or use of iterative reconstruction technique. COMPARISON:  None Available. FINDINGS: CT HEAD FINDINGS Brain: No evidence of acute infarction, hemorrhage, hydrocephalus, extra-axial collection or mass lesion/mass effect. Mild generalized cerebral atrophy and chronic microvascular ischemic changes of the white matter. Vascular: No hyperdense vessel or unexpected calcification. Skull: Normal. Negative for fracture or focal lesion. Sinuses/Orbits: No acute finding. Other: None. CT CERVICAL SPINE FINDINGS Alignment: Mild retrolisthesis at C4. Mild anterolisthesis at C7. Straightening of the cervical spine. Skull base and vertebrae: No acute fracture. No primary bone lesion or focal pathologic process. Soft tissues and spinal canal: No prevertebral fluid or swelling. No visible canal hematoma. Disc levels: Multilevel degenerate disc disease with disc height loss and small marginal osteophytes. C2-C3: Uncovertebral joint arthropathy with mild left neural foraminal stenosis. C3-C4: Disc height loss and uncovertebral joint arthropathy with mild left and moderate right neural foraminal stenosis. C4-C5: Disc height loss and uncovertebral joint arthropathy with moderate bilateral neural foraminal stenosis. C5-C6: Disc height loss and uncovertebral joint arthropathy with moderate left and severe  right neural foraminal stenosis. C6-C7: Disc height loss with uncovertebral joint arthropathy and mild right and moderate left neural foraminal stenosis. C7-T1: Bilateral facet joint arthropathy. No significant spinal canal or neural foraminal stenosis. Upper chest: Biapical pleural/parenchymal scarring. Other: None IMPRESSION: CT HEAD: 1. No acute intracranial abnormality. 2. Mild generalized cerebral atrophy and chronic microvascular ischemic changes of the white matter. CT CERVICAL SPINE: 1. No acute fracture or traumatic subluxation. 2. Multilevel degenerate disc disease with disc height loss and uncovertebral joint arthropathy. Electronically Signed   By: Keane Police D.O.   On: 08/17/2022 12:07   DG Chest 1 View  Result Date: 08/17/2022 CLINICAL DATA:  Malaise EXAM: CHEST  1 VIEW COMPARISON:  Chest x-ray dated December 12th 2022 FINDINGS: Cardiac and mediastinal contours are unchanged post median sternotomy and CABG. Left chest wall biventricular pacer with leads unchanged in position. New nodular opacity of the lower right lung, likely due to overlapping osseous and vascular shadows. Lungs are otherwise clear. Pleural effusion or pneumothorax. IMPRESSION: New nodular opacity of the lower right lung, likely due to overlapping osseous and vascular shadows, although pulmonary nodule can not be excluded. Recommend PA and lateral chest x-ray for further evaluation, and if finding persists CT is recommended for further evaluation. Lungs are otherwise clear. Electronically Signed   By: Yetta Glassman M.D.   On: 08/17/2022 11:45    EKG: Independently reviewed.   Assessment/Plan  TypeIIDM with DKA - on insulin drip per  dka protocol  -with noted gap now 8  -transition orders written  -ivfs per protocol  -strict I/o  -fs per protocol -ada/cardiac diet  -last A1c    UTI -ctx  -f/u on culture -de escalate as able   Pulmonary nodule  -ct thorax ordered   CAD s/p CABG X 4 -continue  GDMT  Chronic combined systolic and diastolic heart failure - with LBBB s/p biventricular pacemaker Euvolemic to dry on exam  -hold lasix for now resume as able  -  hold entresto /farxiga for now resume as able  -continue metoprolol   Moderated -Severe mitral valve regurgitation -no thought to be a candidate for repair -continue medical therapy per cards note    Persistent atrial fibrillation -continue on anticoagulation -continue amiodarone,toprol   Hypertension -continue on home regime as able     Hyperlipidemia -check lipid panel    DVT prophylaxis: heparin Code Status:full/ as discussed per patient wishes in event of cardiac arrest  Family Communication: none at bedside Disposition Plan: patient  expected to be admitted greater than 2 midnights  Consults called: n/a Admission status: progressive   Clance Boll MD [facility]   If 7PM-7AM, please contact night-coverage [email or web address] Password [facility]  08/18/2022, 4:56 AM

## 2022-08-18 NOTE — Discharge Instructions (Signed)

## 2022-08-18 NOTE — Progress Notes (Signed)
Initial Nutrition Assessment  DOCUMENTATION CODES:   Underweight  INTERVENTION:   Multivitamin w/ minerals daily Liberalize diet to regular due to significant weight loss. Meal ordering with assist Encourage good PO intake  Ensure Enlive po BID, each supplement provides 350 kcal and 20 grams of protein.  NUTRITION DIAGNOSIS:   Increased nutrient needs related to chronic illness as evidenced by estimated needs.  GOAL:   Patient will meet greater than or equal to 90% of their needs  MONITOR:   PO intake, Supplement acceptance, Labs, Weight trends  REASON FOR ASSESSMENT:   Malnutrition Screening Tool    ASSESSMENT:   89 y.o female presented to the ED after a fall and poor appetite x 1 week. PMH includes CAD s/p CABG, T2DM, HTN, HLD, CHF, A. Fib, and anxiety. Pt admitted with DKA, UTI, and pulmonary nodule.   RD working remotely at time of assessment. Discussed with RN. Reports that pt did not eat any breakfast and only had potato soup for lunch.  RD to liberalize pt diet to regular due to decreased PO intake and increased needs from acute illness.  Per EMR, pt has had a 21% weight loss within 8 months, this is clinically significant for time frame. Although, pt with chronic illness and cannot determine fluid loss from actual dry weight loss. Suspect that pt may be malnourished with chief complaint of poor appetite and significant weight loss, although unable to diagnosis at this time without physical exam.   Medications reviewed and include: NovoLog SSI, Levemir, IV antibiotics  Labs reviewed: Hgb A1c 8.8%, 24 hr CBGs 170-296  NUTRITION - FOCUSED PHYSICAL EXAM:  Deferred to follow-up due to RD working remotely.   Diet Order:   Diet Order             Diet regular Room service appropriate? Yes with Assist; Fluid consistency: Thin  Diet effective now                   EDUCATION NEEDS:   No education needs have been identified at this time  Skin:  Skin  Assessment: Reviewed RN Assessment  Last BM:  Unknown  Height:   Ht Readings from Last 1 Encounters:  08/17/22 '5\' 2"'$  (1.575 m)    Weight:   Wt Readings from Last 1 Encounters:  08/17/22 44.6 kg    Ideal Body Weight:  50 kg  BMI:  Body mass index is 17.98 kg/m.  Estimated Nutritional Needs:   Kcal:  1400-1600 Protein:  75-90 grams Fluid:  >/= 1.5 L   Hermina Barters RD, LDN Clinical Dietitian See [facility] for contact information.

## 2022-08-18 NOTE — Care Management (Signed)
  Transition of Care [facility] Screening Note   Patient Details  Name: Cynthia [name] Date of Birth: 1931/09/24   Transition of Care [facility]) CM/SW Contact:    Carles Collet, RN Phone Number: 08/18/2022, 8:08 AM    Transition of Care [facility]) has reviewed patient and  We will continue to monitor patient advancement through interdisciplinary progression rounds. If new patient transition needs arise, please place a TOC consult.  From home alone, DKA, PT OT pending.

## 2022-08-18 NOTE — Evaluation (Signed)
Physical Therapy Evaluation Patient Details Name: Cynthia [name] MRN: [id number] DOB: [birth year] Today's Date: 08/18/2022  History of Present Illness  89 yo female prsents to [facility] on 1/8 with DKA, fall. PMH includes CAD s/p PCI to LAD April 1998 and CABG X 13 June 1997, HF, LBBB s/p biventricular pacemaker 2022, severe MVR, afib on anticoag, mild carotid artery disease, HTN, HLD, T2DM, R THA.  Clinical Impression   Pt presents with generalized weakness, impaired cognition vs anticipated baseline, impaired balance, and decreased activity tolerance. Pt to benefit from acute PT to address deficits. Pt ambulated short room distance x2 with use of RW, overall requiring light physical assist to steady and rise from seated position. Pt lives alone at baseline, recommend d/c to SNF level of care to return to PLOF.  PT to progress mobility as tolerated, and will continue to follow acutely.         Recommendations for follow up therapy are one component of a multi-disciplinary discharge planning process, led by the attending physician.  Recommendations may be updated based on patient status, additional functional criteria and insurance authorization.  Follow Up Recommendations Skilled nursing-short term rehab (<3 hours/day) Can patient physically be transported by private vehicle: Yes    Assistance Recommended at Discharge Frequent or constant Supervision/Assistance  Patient can return home with the following  A little help with walking and/or transfers;A little help with bathing/dressing/bathroom;Assistance with cooking/housework;Direct supervision/assist for financial management;Direct supervision/assist for medications management;Assist for transportation;Help with stairs or ramp for entrance    Equipment Recommendations None recommended by PT  Recommendations for Other Services       Functional Status Assessment Patient has had a recent decline in their functional status and demonstrates the  ability to make significant improvements in function in a reasonable and predictable amount of time.     Precautions / Restrictions Precautions Precautions: Fall Restrictions Weight Bearing Restrictions: No      Mobility  Bed Mobility Overal bed mobility: Needs Assistance Bed Mobility: Supine to Sit     Supine to sit: Min assist, HOB elevated     General bed mobility comments: assist for trunk elevation, increased time    Transfers Overall transfer level: Needs assistance Equipment used: Rolling walker (2 wheels) Transfers: Sit to/from Stand Sit to Stand: Min assist           General transfer comment: assist to rise and steady, very increased time to rise from low toilet. sts x2    Ambulation/Gait Ambulation/Gait assistance: Min assist Gait Distance (Feet): 15 Feet (x2 - to and from toilet) Assistive device: Rolling walker (2 wheels) Gait Pattern/deviations: Step-through pattern, Decreased stride length, Trunk flexed Gait velocity: decr     General Gait Details: assist to steady and guide RW, cues for upright posture, managing RW  Stairs            Wheelchair Mobility    Modified Rankin (Stroke Patients Only)       Balance Overall balance assessment: Needs assistance Sitting-balance support: No upper extremity supported, Feet supported Sitting balance-Leahy Scale: Fair     Standing balance support: Bilateral upper extremity supported, During functional activity, Reliant on assistive device for balance Standing balance-Leahy Scale: Poor                               Pertinent Vitals/Pain Pain Assessment Pain Assessment: No/denies pain    Home Living Family/patient expects to be discharged to:: Private residence  Living Arrangements: Alone Available Help at Discharge: Family;Available PRN/intermittently Type of Home: House Home Access: Ramped entrance       Home Layout: One level Home Equipment: Cane - single point;Grab bars  - toilet;Grab bars - tub/shower;Air cabin crew (4 wheels)      Prior Function Prior Level of Function : Needs assist             Mobility Comments: pt reports walking with a cane or rollator ADLs Comments: pt reports someone comes in once a week to clean, and family/friends bring her food and groceries as needed but pt states she still drives some to do her errands     Hand Dominance   Dominant Hand: Right    Extremity/Trunk Assessment   Upper Extremity Assessment Upper Extremity Assessment: Defer to OT evaluation    Lower Extremity Assessment Lower Extremity Assessment: Generalized weakness    Cervical / Trunk Assessment Cervical / Trunk Assessment: Normal  Communication   Communication: No difficulties  Cognition Arousal/Alertness: Awake/alert Behavior During Therapy: WFL for tasks assessed/performed Overall Cognitive Status: Impaired/Different from baseline Area of Impairment: Orientation, Attention, Memory, Following commands, Safety/judgement, Problem solving                 Orientation Level: Disoriented to, Situation Current Attention Level: Sustained   Following Commands: Follows one step commands consistently Safety/Judgement: Decreased awareness of safety, Decreased awareness of deficits   Problem Solving: Requires verbal cues, Requires tactile cues General Comments: pt with strange timeline of events prior to coming to hospital, says she stayed overnight in her doctor's office in their back room? Pt requires max cues for safe use of RW, as pt letting go of RW multiple times during gait. Pt states no one knows where she is and she cannot call her nephew, PT retrieved nephew's number and assisted pt in dialing and calling him. Nephew overhead saying "I know where you are, you're at [facility] in room 5W8" and pt is surprised he knows this.        General Comments      Exercises     Assessment/Plan    PT Assessment Patient needs continued PT  services  PT Problem List Decreased strength;Decreased mobility;Decreased activity tolerance;Decreased balance;Decreased knowledge of use of DME;Pain;Decreased cognition;Decreased safety awareness       PT Treatment Interventions Therapeutic activities;DME instruction;Gait training;Therapeutic exercise;Patient/family education;Balance training;Functional mobility training;Neuromuscular re-education    PT Goals (Current goals can be found in the Care Plan section)  Acute Rehab PT Goals Patient Stated Goal: get more strength in my legs PT Goal Formulation: With patient Time For Goal Achievement: 09/01/22 Potential to Achieve Goals: Good    Frequency Min 2X/week     Co-evaluation               AM-PAC PT "6 Clicks" Mobility  Outcome Measure Help needed turning from your back to your side while in a flat bed without using bedrails?: A Little Help needed moving from lying on your back to sitting on the side of a flat bed without using bedrails?: A Little Help needed moving to and from a bed to a chair (including a wheelchair)?: A Little Help needed standing up from a chair using your arms (e.g., wheelchair or bedside chair)?: A Little Help needed to walk in hospital room?: A Little Help needed climbing 3-5 steps with a railing? : A Lot 6 Click Score: 17    End of Session   Activity Tolerance: Patient tolerated treatment well Patient left:  in chair;with chair alarm set;with call bell/phone within reach Nurse Communication: Mobility status PT Visit Diagnosis: Other abnormalities of gait and mobility (R26.89);Muscle weakness (generalized) (M62.81)    Time: 4069-8614 PT Time Calculation (min) (ACUTE ONLY): 23 min   Charges:   PT Evaluation $PT Eval Low Complexity: 1 Low PT Treatments $Therapeutic Activity: 8-22 mins       Stacie Glaze, PT DPT Acute Rehabilitation Services Pager [phone]  Office [phone]   [place] E Ruffin Pyo 08/18/2022, 11:55 AM

## 2022-08-19 DIAGNOSIS — E1310 Other specified diabetes mellitus with ketoacidosis without coma: Secondary | ICD-10-CM | POA: Diagnosis not present

## 2022-08-19 DIAGNOSIS — R55 Syncope and collapse: Secondary | ICD-10-CM | POA: Diagnosis not present

## 2022-08-19 DIAGNOSIS — E1169 Type 2 diabetes mellitus with other specified complication: Secondary | ICD-10-CM | POA: Diagnosis not present

## 2022-08-19 DIAGNOSIS — I4819 Other persistent atrial fibrillation: Secondary | ICD-10-CM | POA: Diagnosis not present

## 2022-08-19 LAB — COMPREHENSIVE METABOLIC PANEL
ALT: 25 U/L (ref 0–44)
AST: 41 U/L (ref 15–41)
Albumin: 3.1 g/dL — ABNORMAL LOW (ref 3.5–5.0)
Alkaline Phosphatase: 84 U/L (ref 38–126)
Anion gap: 9 (ref 5–15)
BUN: 20 mg/dL (ref 8–23)
CO2: 26 mmol/L (ref 22–32)
Calcium: 11.2 mg/dL — ABNORMAL HIGH (ref 8.9–10.3)
Chloride: 106 mmol/L (ref 98–111)
Creatinine, Ser: 0.7 mg/dL (ref 0.44–1.00)
GFR, Estimated: >60 mL/min (ref >60)
Glucose, Bld: 272 mg/dL — ABNORMAL HIGH (ref 70–99)
Potassium: 3.7 mmol/L (ref 3.5–5.1)
Sodium: 141 mmol/L (ref 135–145)
Total Bilirubin: 0.8 mg/dL (ref 0.3–1.2)
Total Protein: 5.8 g/dL — ABNORMAL LOW (ref 6.5–8.1)

## 2022-08-19 LAB — GLUCOSE, CAPILLARY
Glucose-Capillary: 101 mg/dL — ABNORMAL HIGH (ref 70–99)
Glucose-Capillary: 137 mg/dL — ABNORMAL HIGH (ref 70–99)
Glucose-Capillary: 237 mg/dL — ABNORMAL HIGH (ref 70–99)
Glucose-Capillary: 285 mg/dL — ABNORMAL HIGH (ref 70–99)
Glucose-Capillary: 437 mg/dL — ABNORMAL HIGH (ref 70–99)
Glucose-Capillary: 68 mg/dL — ABNORMAL LOW (ref 70–99)

## 2022-08-19 LAB — CBC
HCT: 36.3 % (ref 36.0–46.0)
Hemoglobin: 12 g/dL (ref 12.0–15.0)
MCH: 32.9 pg (ref 26.0–34.0)
MCHC: 33.1 g/dL (ref 30.0–36.0)
MCV: 99.5 fL (ref 80.0–100.0)
Platelets: 157 10*3/uL (ref 150–400)
RBC: 3.65 MIL/uL — ABNORMAL LOW (ref 3.87–5.11)
RDW: 14.6 % (ref 11.5–15.5)
WBC: 4.4 10*3/uL (ref 4.0–10.5)
nRBC: 0 % (ref 0.0–0.2)

## 2022-08-19 MED ORDER — INSULIN DETEMIR 100 UNIT/ML ~~LOC~~ SOLN
22.0000 [IU] | Freq: Every day | SUBCUTANEOUS | Status: DC
  Administered 2022-08-19: 22 [IU] via SUBCUTANEOUS
  Filled 2022-08-19 (×2): qty 0.22

## 2022-08-19 MED ORDER — INSULIN ASPART 100 UNIT/ML IJ SOLN
4.0000 [IU] | Freq: Three times a day (TID) | INTRAMUSCULAR | Status: DC
  Administered 2022-08-19 – 2022-08-20 (×6): 4 [IU] via SUBCUTANEOUS

## 2022-08-19 MED ORDER — INSULIN ASPART 100 UNIT/ML IJ SOLN
0.0000 [IU] | Freq: Three times a day (TID) | INTRAMUSCULAR | Status: DC
  Administered 2022-08-19 (×2): 2 [IU] via SUBCUTANEOUS
  Administered 2022-08-19: 8 [IU] via SUBCUTANEOUS
  Administered 2022-08-20: 3 [IU] via SUBCUTANEOUS
  Administered 2022-08-20: 5 [IU] via SUBCUTANEOUS

## 2022-08-19 NOTE — NC FL2 (Signed)
[place] MEDICAID FL2 LEVEL OF CARE FORM     IDENTIFICATION  Patient Name: Cynthia [name] Birthdate: 30-Mar-1932 Sex: female Admission Date (Current Location): 08/17/2022  [facility] and [place] Number:  Herbalist and Address:  [facility] [place], [place] [address], [place], [place] [address]      Provider Number: [id number]  Attending Physician Name and Address:  Jonetta Osgood, MD  Relative Name and Phone Number:       Current Level of Care: Hospital Recommended Level of Care: [place] Prior Approval Number:    Date Approved/Denied:   PASRR Number: [id number]  Discharge Plan: SNF    Current Diagnoses: Patient Active Problem List   Diagnosis Date Noted   DKA (diabetic ketoacidosis) ([place]) 08/17/2022   Biventricular cardiac pacemaker in situ 11/04/2021   Persistent atrial fibrillation ([place]) 10/23/2021   Secondary hypercoagulable state ([place]) 10/23/2021   PAF (paroxysmal atrial fibrillation) ([place]) 74/07/8785   Chronic systolic heart failure ([place]) 07/08/2021   Acute on chronic combined systolic and diastolic CHF (congestive heart failure) ([place]) 04/16/2021   LBBB (left bundle branch block) 04/16/2021   Mixed hyperlipidemia 03/26/2017   Hyponatremia 08/07/2016   Chest pain 07/27/2016   Coronary artery disease involving native coronary artery of native heart with unstable angina pectoris ([place]) 07/23/2016   S/P right THA, AA 03/12/2015   Abdominal bruit 01/16/2013   Hypertension 07/09/2011   Coronary artery disease    Dyslipidemia    DM type 2 (diabetes mellitus, type 2) (HCC)    Carotid bruit    Anxiety    Palpitations    PVC's (premature ventricular contractions)     Orientation RESPIRATION BLADDER Height & Weight     Self  Normal Continent Weight: 103 lb 13.4 oz (47.1 kg) Height:  '5\' 2"'$  (157.5 cm) (per pt)  BEHAVIORAL SYMPTOMS/MOOD NEUROLOGICAL BOWEL NUTRITION STATUS      Continent Diet (See DC Summary)  AMBULATORY  STATUS COMMUNICATION OF NEEDS Skin   Limited Assist Verbally Normal                       Personal Care Assistance Level of Assistance  Bathing, Feeding, Dressing Bathing Assistance: Limited assistance Feeding assistance: Limited assistance Dressing Assistance: Limited assistance     Functional Limitations Info             SPECIAL CARE FACTORS FREQUENCY  PT (By licensed PT), OT (By licensed OT)     PT Frequency: 5x/week OT Frequency: 5x/week            Contractures Contractures Info: Not present    Additional Factors Info  Code Status, Allergies, Insulin Sliding Scale Code Status Info: Full Allergies Info: Hydrochlorothiazide   Insulin Sliding Scale Info: See dc summary       Current Medications (08/19/2022):  This is the current hospital active medication list Current Facility-Administered Medications  Medication Dose Route Frequency Provider Last Rate Last Admin   amiodarone (PACERONE) tablet 200 mg  200 mg Oral Once per day on Mon Tue Wed Thu Fri Sat Jonetta Osgood, MD   200 mg at 08/19/22 7672   apixaban (ELIQUIS) tablet 2.5 mg  2.5 mg Oral BID Jonetta Osgood, MD   2.5 mg at 08/19/22 0947   atorvastatin (LIPITOR) tablet 40 mg  40 mg Oral Daily Jonetta Osgood, MD   40 mg at 08/19/22 0834   dextrose 50 % solution 0-50 mL  0-50 mL Intravenous  PRN Amedeo Gory, Celeste A, PA-C       feeding supplement (ENSURE ENLIVE / ENSURE PLUS) liquid 237 mL  237 mL Oral BID BM Jonetta Osgood, MD   237 mL at 08/19/22 0835   insulin aspart (novoLOG) injection 0-15 Units  0-15 Units Subcutaneous TID WC Jonetta Osgood, MD   2 Units at 08/19/22 0831   insulin aspart (novoLOG) injection 4 Units  4 Units Subcutaneous TID WC Jonetta Osgood, MD   4 Units at 08/19/22 0830   insulin detemir (LEVEMIR) injection 22 Units  22 Units Subcutaneous Daily Jonetta Osgood, MD   22 Units at 08/19/22 0846   isosorbide mononitrate (IMDUR) 24 hr tablet 60 mg  60 mg Oral Daily  Jonetta Osgood, MD   60 mg at 08/19/22 3532   metoprolol succinate (TOPROL-XL) 24 hr tablet 25 mg  25 mg Oral Daily Jonetta Osgood, MD   25 mg at 08/19/22 9924   multivitamin with minerals tablet 1 tablet  1 tablet Oral Daily Jonetta Osgood, MD   1 tablet at 08/19/22 2683   sacubitril-valsartan (ENTRESTO) 49-51 mg per tablet  1 tablet Oral BID Jonetta Osgood, MD   1 tablet at 08/19/22 4196     Discharge Medications: Please see discharge summary for a list of discharge medications.  Relevant Imaging Results:  Relevant Lab Results:   Additional Information SSN: [place], [place]

## 2022-08-19 NOTE — TOC Initial Note (Signed)
Transition of Care [facility]) - Initial/Assessment Note    Patient Details  Name: Cynthia [name] MRN: [id number] Date of Birth: [birth year]  Transition of [facility]) CM/SW Contact:    Benard Halsted, LCSW Phone Number: 08/19/2022, 12:13 PM  Clinical Narrative:                 CSW received consult for possible SNF placement at time of discharge. CSW spoke with patient and nephew at bedside. Patient pleasant and mildly confused. Nephew requested to speak outside room. He reported that he is patient's POA and only family member. He expressed understanding of PT recommendation and is agreeable to SNF placement at time of discharge. He stated that patient was supposed to move into [place] in September but she wanted to wait until after the new year and at that point she was still driving. He requested to see if patient could go to SNF at [facility] for rehab. CSW discussed insurance authorization process and will provide Medicare SNF ratings list. CSW will send out referrals for review and provide bed offers as available.   CSW spoke with Joellen Jersey at [facility] and explained the patient's situation. She stated that unfortunately they do not have any rehab beds available to offer this week. CSW will further search.  Skilled Nursing Rehab Facilities-   [email or web address]   Ratings out of 5 stars (5 the highest)   Name Address  Phone # Arthur Inspection Overall  [facility] [address] [place] '4 5 2 3  '$ [facility]  [address], [place] [phone] '4 2 5 5  '$ [facility] [place], [place] '1 3 1 1  '$ [facility] [place], [place] '2 2 4 4  '$ [facility] [address] [place] '2 1 2 1  '$ [place] '3 3 4 4  '$ [facility] [address], [place] '4 1 3 2   '$ [facility] [address] [place] '4 1 3 2  '$ Madelynn Done ([place]) [address] [place] [phone] '3 1 2 1  '$ [facility] [phone] [address], [place] [phone] '3 1 1 1  '$ [facility] [address], [facility] [phone] '3 2 2 2  '$ [facility] [place] ([place]) [place] [place], [place] [phone] '3 1 1 1  '$ Dustin Flock [address] [phone] '4 2 4 4          '$ [facility] [address], [place] '4 1 3 2  '$ [facility] [place] [address] [place] '3 1 5 4  '$ [place] [address] [place] [address], [place] [phone] '1 1 2 1  '$ [facility] [address], [employer] [phone] '2 2 4 4          '$ [address] (no [facility]) [place] [address], [place] [phone] '5 5 5 5  '$ [facility] (No Humana) [address], [place] '4 1 4 3  '$ [facility] (No UHC) [place], [place] '5 5 5 5  '$ [facility] [address], [employer] [phone] '2 3 5 5  '$ [facility] [address], [place] '1 1 2 1  '$ [facility] [address] [place] [phone] '3 1 1 1  '$ Chanda Busing [place], [place]  $'5 2 5 5  'd$ [facility]  [address], [place] '2 2 1 1  '$ [facility] [address] [place] '3 2 1 1  '$ [facility] [place], [place] '2 2 2 2          '$ [facility] [address] [place] [phone] '1 1 1 1  '$ [facility] [address], [place]  [phone] '2 4 3 3  '$ [facility] [place] [address], [place] [phone] '3 2 3 3  '$ [place], [place] '2 1 1 1  '$ [place] (No Humana) [address] [place] [phone] '2 2 3 3  '$ [facility]) [address], [place]  [phone] '2 1 1 1          '$ [facility] [place], [place] '5 4 5 5  '$ [facility])  [address], [place] '2 1 2 1  '$ [facility]) [place] [address] [place] '3 1 4 3  '$ [place] [address], [place] '3 3 4 4  '$ [address] [place], [place] '2 3 1 1  '$ [place] [facility]) [address] [place] [phone] '2 1 4 3     '$ Expected Discharge Plan: [place] Barriers to Discharge: Continued Medical Work up, SNF Pending bed offer   Patient Goals and CMS Choice Patient states their goals for this hospitalization and ongoing recovery are:: Rehab CMS Medicare.gov Compare Post Acute Care list provided to:: Patient Represenative (must comment) Choice offered to / list presented to : Why / [place] [facility] interest in [facility].provided to:: [facility] POA / Guardian    Expected Discharge Plan and Services In-house Referral: Clinical Social Work   Post Acute Care Choice: [place] Living arrangements for the past 2 months: [place]                                      Prior Living Arrangements/Services Living arrangements for the past 2 months: Single Family Home Lives with:: Self Patient language and need for interpreter reviewed:: Yes Do you feel safe going back to the place where you live?: Yes      Need for Family Participation in Patient Care: Yes (Comment) Care giver support system in place?: Yes (comment)   Criminal Activity/Legal Involvement Pertinent to Current Situation/Hospitalization: No - Comment as needed  Activities of Daily Living Home Assistive Devices/Equipment: Eyeglasses, Cane (specify quad or straight) ADL Screening (condition at time of admission) Patient's cognitive ability adequate to safely complete daily activities?: Yes Is the  patient deaf or have difficulty hearing?: No Does the patient have difficulty seeing, even when wearing glasses/contacts?: No Does the patient have difficulty concentrating, remembering, or making decisions?: No Patient able to express need for assistance with ADLs?: Yes Does the patient have difficulty dressing or bathing?: No Independently performs ADLs?: Yes (appropriate for developmental age) Does the patient have difficulty walking or climbing stairs?: Yes Weakness of Legs: None Weakness of Arms/Hands: None  Permission Sought/Granted Permission sought to share information with : Facility Sport and exercise psychologist, Family Supports Permission granted to share information with : Yes, Verbal Permission Granted  Share Information with NAME: Marlou Sa  Permission granted to share info w AGENCY: SNFs  Permission granted to share  info w Relationship: Nephew/POA  Permission granted to share info w Contact Information: [phone]  Emotional Assessment Appearance:: Appears stated age   Affect (typically observed): Accepting, Appropriate, Pleasant Orientation: : Oriented to Self Alcohol / Substance Use: Not Applicable Psych Involvement: No (comment)  Admission diagnosis:  DKA (diabetic ketoacidosis) ([place]) [E11.10] Diabetic ketoacidosis without coma associated with other specified diabetes mellitus ([place]) [E13.10] Patient Active Problem List   Diagnosis Date Noted   DKA (diabetic ketoacidosis) ([place]) 08/17/2022   Biventricular cardiac pacemaker in situ 11/04/2021   Persistent atrial fibrillation ([place]) 10/23/2021   Secondary hypercoagulable state ([place]) 10/23/2021   PAF (paroxysmal atrial fibrillation) (HCC) 33/29/5188   Chronic systolic heart failure ([place]) 07/08/2021   Acute on chronic combined systolic and diastolic CHF (congestive heart failure) ([place]) 04/16/2021   LBBB (left bundle branch block) 04/16/2021   Mixed hyperlipidemia 03/26/2017   Hyponatremia 08/07/2016   Chest pain 07/27/2016    Coronary artery disease involving native coronary artery of native heart with unstable angina pectoris ([place]) 07/23/2016   S/P right THA, AA 03/12/2015   Abdominal bruit 01/16/2013   Hypertension 07/09/2011   Coronary artery disease    Dyslipidemia    DM type 2 (diabetes mellitus, type 2) (HCC)    Carotid bruit    Anxiety    Palpitations    PVC's (premature ventricular contractions)    PCP:  Reynold Bowen, MD Pharmacy[facility]   [id number] [place] [id number]: [phone]: [phone] [facility] [place] [address] [address] [place]Phone: [phone]: [phone]    Social Determinants of Health (SDOH) Social History: SDOH Screenings   Food Insecurity: No Food Insecurity (08/17/2022)  Housing: Low Risk  (08/17/2022)  Transportation Needs: No Transportation Needs (08/17/2022)  Utilities: Not At Risk (08/17/2022)  Tobacco Use: Low Risk  (08/17/2022)   SDOH Interventions:     Readmission Risk Interventions     No data to display

## 2022-08-19 NOTE — Progress Notes (Signed)
Patient seems restless and agitated,getting more confused and her blood sugar was at '437mg'$ /dl.Dr.Chen made aware,ordered for 10 unit Novolog and Haldol '5mg'$  IV.

## 2022-08-19 NOTE — Progress Notes (Signed)
PROGRESS NOTE        PATIENT DETAILS Name: Cynthia [name] Age: 89 y.o. Sex: female Date of Birth: 09-Feb-1932 Admit Date: 08/17/2022 Admitting Physician Marcelyn Bruins, MD GYK:ZLDJT, Annie Main, MD  Brief Summary: Patient is a 89 y.o.  female with history of chronic HFrEF, s/p PCI/CABG in 1998, PAF on anticoagulation, permanent pacemaker implantation-who presented with mechanical fall versus syncope-was found to be in DKA and subsequently admitted to the hospitalist service.  Significant events: 1/8>> admit to [facility]-syncope versus mechanical fall, DKA.  Significant studies: 1/8>> CT head: No acute intracranial abnormality. 1/8>> CT C-spine: No fracture/dislocation 1/9>> CT chest: Bilateral solid pulmonary nodules-repeat CT chest-18-24 months.  Significant microbiology data: None  Procedures: None  Consults: None  Subjective: Pleasantly confused-no major issues overnight.  Objective: Vitals: Blood pressure (!) 129/96, pulse 66, temperature 97.7 F (36.5 C), temperature source Oral, resp. rate 20, height '5\' 2"'$  (1.575 m), weight 47.1 kg, SpO2 93 %.   Exam: Gen Exam:Alert awake-not in any distress HEENT:atraumatic, normocephalic Chest: B/L clear to auscultation anteriorly CVS:S1S2 regular Abdomen:soft non tender, non distended Extremities:no edema Neurology: Non focal Skin: no rash  Pertinent Labs/Radiology:    Latest Ref Rng & Units 08/19/2022    4:11 AM 08/17/2022    2:45 PM 08/17/2022   11:10 AM  CBC  WBC 4.0 - 10.5 K/uL 4.4   6.3   Hemoglobin 12.0 - 15.0 g/dL 12.0  13.9  12.8   Hematocrit 36.0 - 46.0 % 36.3  41.0  39.6   Platelets 150 - 400 K/uL 157   185     Lab Results  Component Value Date   NA 141 08/19/2022   K 3.7 08/19/2022   CL 106 08/19/2022   CO2 26 08/19/2022      Assessment/Plan: DKA Resolved with IV insulin/IVF Now on SQ insulin  Syncope versus mechanical fall Apparently patient ended up on the floor this past  Saturday night-she has no recollection how she got to the floor. Cardiology interrogated PPM on 1/9-no arrhythmias Continue to mobilize with PT/OT. Neuroimaging negative for any significant issues  Asymptomatic Bacteruria No symp of UTI No need for Rocephin.  DM-2 (A1c 8.8 on 1/9) with uncontrolled hyperglycemia CBGs significantly elevated overnight Increase Levemir to 22 units add 4 units of NovoLog with meals-continue SSI Reassess 1/11.    Recent Labs    08/18/22 2055 08/18/22 2344 08/19/22 0805  GLUCAP 341* 437* 237*     Chronic HFrEF (EF 30-35% by TTE on 01/26/2022) Euvolemic Continue beta-blocker/Entresto/Imdur Resume diuretics over the next day or so.    Persistent atrial fibrillation Paced rhythm Continue amiodarone/Toprol/Eliquis  History of biventricular pacemaker placement Telemetry monitoring  CAD s/p CABG/PCI No chest pain/shortness of breath On beta-blocker/statin-suspect not on aspirin as on Eliquis.  Moderate to severe mitral valve regurgitation Continue outpatient follow-up with cardiology  Lung nodule Repeat CT chest 18-24 months per radiology recommendation  Delirium Suspect some amount of chronic cognitive dysfunction at baseline-some mild confusion overnight. Continue to maintain delirium precautions.  BMI: Estimated body mass index is 18.99 kg/m as calculated from the following:   Height as of this encounter: '5\' 2"'$  (1.575 m).   Weight as of this encounter: 47.1 kg.   Code status:   Code Status: Full Code   DVT Prophylaxis: apixaban (ELIQUIS) tablet 2.5 mg Start: 08/18/22 1000 apixaban (ELIQUIS) tablet 2.5  mg    Family Communication: Nephew [phone] called on 1/10-left VM   Disposition Plan: Status is: Inpatient Remains inpatient appropriate because: Severity of illness.   Planned Discharge Destination:SNF   Diet: Diet Order             Diet heart healthy/carb modified Room service appropriate? Yes; Fluid  consistency: Thin  Diet effective now                     Antimicrobial agents: Anti-infectives (From admission, onward)    Start     Dose/Rate Route Frequency Ordered Stop   08/18/22 0600  cefTRIAXone (ROCEPHIN) 2 g in sodium chloride 0.9 % 100 mL IVPB  Status:  Discontinued        2 g 200 mL/hr over 30 Minutes Intravenous Every 24 hours 08/18/22 0533 08/18/22 1549        MEDICATIONS: Scheduled Meds:  amiodarone  200 mg Oral Once per day on Mon Tue Wed Thu Fri Sat   apixaban  2.5 mg Oral BID   atorvastatin  40 mg Oral Daily   feeding supplement  237 mL Oral BID BM   insulin aspart  0-15 Units Subcutaneous TID WC   insulin aspart  4 Units Subcutaneous TID WC   insulin detemir  22 Units Subcutaneous Daily   isosorbide mononitrate  60 mg Oral Daily   metoprolol succinate  25 mg Oral Daily   multivitamin with minerals  1 tablet Oral Daily   sacubitril-valsartan  1 tablet Oral BID   Continuous Infusions:   PRN Meds:.dextrose   I have personally reviewed following labs and imaging studies  LABORATORY DATA: CBC: Recent Labs  Lab 08/17/22 1110 08/17/22 1445 08/19/22 0411  WBC 6.3  --  4.4  HGB 12.8 13.9 12.0  HCT 39.6 41.0 36.3  MCV 100.5*  --  99.5  PLT 185  --  157     Basic Metabolic Panel: Recent Labs  Lab 08/17/22 1110 08/17/22 1440 08/17/22 2130 08/17/22 2317 08/18/22 0037 08/18/22 0517 08/19/22 0411  NA 137   < > 143 139 142 141 141  K 5.0   < > 4.1 4.0 3.8 3.7 3.7  CL 98   < > 105 104 107 106 106  CO2 16*   < > '27 23 27 25 26  '$ GLUCOSE 353*   < > 211* 184* 180* 221* 272*  BUN 31*   < > '21 18 17 15 20  '$ CREATININE 0.93   < > 0.69 0.73 0.71 0.59 0.70  CALCIUM 12.1*   < > 10.9* 10.5* 10.8* 10.5* 11.2*  MG 1.8  --   --   --   --   --   --    < > = values in this interval not displayed.     GFR: Estimated Creatinine Clearance: 36.1 mL/min (by C-G formula based on SCr of 0.7 mg/dL).  Liver Function Tests: Recent Labs  Lab 08/17/22 1110  08/19/22 0411  AST 17 41  ALT 10 25  ALKPHOS 76 84  BILITOT 1.1 0.8  PROT 7.2 5.8*  ALBUMIN 4.2 3.1*    Recent Labs  Lab 08/17/22 1110  LIPASE 12    No results for input(s): "AMMONIA" in the last 168 hours.  Coagulation Profile: No results for input(s): "INR", "PROTIME" in the last 168 hours.  Cardiac Enzymes: Recent Labs  Lab 08/17/22 1110  CKTOTAL 31*     BNP (last 3 results) No results for input(s): "PROBNP"  in the last 8760 hours.  Lipid Profile: Recent Labs    08/18/22 0610  CHOL 149  HDL 23*  LDLCALC 99  TRIG 135  CHOLHDL 6.5    Thyroid Function Tests: No results for input(s): "TSH", "T4TOTAL", "FREET4", "T3FREE", "THYROIDAB" in the last 72 hours.  Anemia Panel: Recent Labs    08/18/22 0517 08/18/22 0610 08/18/22 0857  VITAMINB12  --   --  291  FOLATE  --  10.0  --   FERRITIN  --  351*  --   TIBC  --  224*  --   IRON  --  50  --   RETICCTPCT 1.5  --   --      Urine analysis:    Component Value Date/Time   COLORURINE YELLOW 08/17/2022 [place] 08/17/2022 1034   LABSPEC 1.025 08/17/2022 1034   PHURINE 5.5 08/17/2022 1034   GLUCOSEU >1,000 (A) 08/17/2022 1034   HGBUR NEGATIVE 08/17/2022 1034   BILIRUBINUR NEGATIVE 08/17/2022 1034   KETONESUR 15 (A) 08/17/2022 1034   PROTEINUR NEGATIVE 08/17/2022 1034   UROBILINOGEN 0.2 03/06/2015 1013   NITRITE NEGATIVE 08/17/2022 1034   LEUKOCYTESUR SMALL (A) 08/17/2022 1034    Sepsis Labs: Lactic Acid, Venous No results found for: "LATICACIDVEN"  MICROBIOLOGY: No results found for this or any previous visit (from the past 240 hour(s)).  RADIOLOGY STUDIES/RESULTS: CT CHEST WO CONTRAST  Result Date: 08/18/2022 CLINICAL DATA:  Follow-up abnormal chest x-ray EXAM: CT CHEST WITHOUT CONTRAST TECHNIQUE: Multidetector CT imaging of the chest was performed following the standard protocol without IV contrast. RADIATION DOSE REDUCTION: This exam was performed according to the  departmental dose-optimization program which includes automated exposure control, adjustment of the mA and/or kV according to patient size and/or use of iterative reconstruction technique. COMPARISON:  Chest x-ray dated August 17, 2022 FINDINGS: Cardiovascular: Normal heart size. No pericardial effusion. Mitral annular calcifications. Severe left main and three-vessel coronary artery calcifications status post CABG. Normal caliber thoracic aorta with severe calcified plaque. Mediastinum/Nodes: Small hiatal hernia. Thyroid is unremarkable. No pathologically enlarged lymph nodes seen in the chest. Lungs/Pleura: Central airways are patent. Trace bilateral pleural effusions and bibasilar atelectasis. Bilateral solid pulmonary nodules. Largest is a solid pulmonary nodule left lower lobe measuring 6 mm on series 4, image 107. Nodular opacity of the right lower lung described on prior chest x-ray favored to be due to overlapping osseous and vascular shadows. Upper Abdomen: Moderate thickening of the partially visualized left adrenal gland. Musculoskeletal: No chest wall mass or suspicious bone lesions identified. IMPRESSION: 1. Nodular opacity of the right lower lung described on prior chest x-ray favored to be due to overlapping osseous and vascular shadows. 2. Bilateral solid pulmonary nodules, largest is a solid pulmonary nodule of the left lower lobe measuring 6 mm. Non-contrast chest CT at 3-6 months is recommended. If the nodules are stable at time of repeat CT, then future CT at 18-24 months (from today's scan) is considered optional for low-risk patients, but is recommended for high-risk patients. This recommendation follows the consensus statement: Guidelines for Management of Incidental Pulmonary Nodules Detected on CT Images: From the Fleischner Society 2017; Radiology 2017; 284:228-243. 3. Moderate thickening of the partially visualized left. Consider dedicated adrenal protocol CT exclude underlying adrenal  nodules. This can be performed non emergently. 4. Aortic Atherosclerosis (ICD10-I70.0). Electronically Signed   By: Yetta Glassman M.D.   On: 08/18/2022 09:00     LOS: 2 days   Oren Binet, MD  [facility]    To contact the attending provider between 7A-7P or the covering provider during after hours 7P-7A, please log into the web site [email or web address] and access using universal [place] password for that web site. If you do not have the password, please call the hospital operator.  08/19/2022, 11:57 AM

## 2022-08-19 NOTE — TOC Progression Note (Signed)
Transition of Care [facility]) - Progression Note    Patient Details  Name: Cynthia [name] MRN: [id number] Date of Birth: [birth year]  Transition of Care [facility] CM/SW [place], LCSW Phone Number: 08/19/2022, 5:29 PM  Clinical Narrative:    CSW provided SNF bed offers to patient's nephew. He will call CSW tomorrow with choice.    Expected Discharge Plan: [place] Barriers to Discharge: Continued Medical Work up, SNF Pending bed offer  Expected Discharge Plan and Services In-house Referral: Clinical Social Work   Post Acute Care Choice: [place] Living arrangements for the past 2 months: [place] Determinants of Health (SDOH) Interventions [place]: No Food Insecurity (08/17/2022)  Housing: Low Risk  (08/17/2022)  Transportation Needs: No Transportation Needs (08/17/2022)  Utilities: Not At Risk (08/17/2022)  Tobacco Use: Low Risk  (08/17/2022)    Readmission Risk Interventions     No data to display

## 2022-08-19 NOTE — Progress Notes (Signed)
Remote pacemaker transmission.   

## 2022-08-20 DIAGNOSIS — F0390 Unspecified dementia without behavioral disturbance: Secondary | ICD-10-CM | POA: Diagnosis not present

## 2022-08-20 DIAGNOSIS — Z9861 Coronary angioplasty status: Secondary | ICD-10-CM | POA: Diagnosis not present

## 2022-08-20 DIAGNOSIS — I081 Rheumatic disorders of both mitral and tricuspid valves: Secondary | ICD-10-CM | POA: Diagnosis not present

## 2022-08-20 DIAGNOSIS — I10 Essential (primary) hypertension: Secondary | ICD-10-CM

## 2022-08-20 DIAGNOSIS — Z9181 History of falling: Secondary | ICD-10-CM | POA: Diagnosis not present

## 2022-08-20 DIAGNOSIS — M4696 Unspecified inflammatory spondylopathy, lumbar region: Secondary | ICD-10-CM | POA: Diagnosis not present

## 2022-08-20 DIAGNOSIS — R55 Syncope and collapse: Secondary | ICD-10-CM | POA: Diagnosis not present

## 2022-08-20 DIAGNOSIS — R1312 Dysphagia, oropharyngeal phase: Secondary | ICD-10-CM | POA: Diagnosis not present

## 2022-08-20 DIAGNOSIS — M6259 Muscle wasting and atrophy, not elsewhere classified, multiple sites: Secondary | ICD-10-CM | POA: Diagnosis not present

## 2022-08-20 DIAGNOSIS — R911 Solitary pulmonary nodule: Secondary | ICD-10-CM | POA: Diagnosis not present

## 2022-08-20 DIAGNOSIS — Z7982 Long term (current) use of aspirin: Secondary | ICD-10-CM | POA: Diagnosis not present

## 2022-08-20 DIAGNOSIS — I482 Chronic atrial fibrillation, unspecified: Secondary | ICD-10-CM | POA: Diagnosis not present

## 2022-08-20 DIAGNOSIS — I480 Paroxysmal atrial fibrillation: Secondary | ICD-10-CM | POA: Diagnosis not present

## 2022-08-20 DIAGNOSIS — I2510 Atherosclerotic heart disease of native coronary artery without angina pectoris: Secondary | ICD-10-CM | POA: Diagnosis not present

## 2022-08-20 DIAGNOSIS — E119 Type 2 diabetes mellitus without complications: Secondary | ICD-10-CM | POA: Diagnosis not present

## 2022-08-20 DIAGNOSIS — N179 Acute kidney failure, unspecified: Secondary | ICD-10-CM | POA: Diagnosis not present

## 2022-08-20 DIAGNOSIS — Z7401 Bed confinement status: Secondary | ICD-10-CM | POA: Diagnosis not present

## 2022-08-20 DIAGNOSIS — M6281 Muscle weakness (generalized): Secondary | ICD-10-CM | POA: Diagnosis not present

## 2022-08-20 DIAGNOSIS — E1165 Type 2 diabetes mellitus with hyperglycemia: Secondary | ICD-10-CM | POA: Diagnosis not present

## 2022-08-20 DIAGNOSIS — G9340 Encephalopathy, unspecified: Secondary | ICD-10-CM | POA: Diagnosis not present

## 2022-08-20 DIAGNOSIS — I69322 Dysarthria following cerebral infarction: Secondary | ICD-10-CM | POA: Diagnosis not present

## 2022-08-20 DIAGNOSIS — E871 Hypo-osmolality and hyponatremia: Secondary | ICD-10-CM | POA: Diagnosis not present

## 2022-08-20 DIAGNOSIS — R4182 Altered mental status, unspecified: Secondary | ICD-10-CM | POA: Diagnosis not present

## 2022-08-20 DIAGNOSIS — I5022 Chronic systolic (congestive) heart failure: Secondary | ICD-10-CM | POA: Diagnosis not present

## 2022-08-20 DIAGNOSIS — E43 Unspecified severe protein-calorie malnutrition: Secondary | ICD-10-CM | POA: Diagnosis not present

## 2022-08-20 DIAGNOSIS — I959 Hypotension, unspecified: Secondary | ICD-10-CM | POA: Diagnosis not present

## 2022-08-20 DIAGNOSIS — L89152 Pressure ulcer of sacral region, stage 2: Secondary | ICD-10-CM | POA: Diagnosis not present

## 2022-08-20 DIAGNOSIS — F09 Unspecified mental disorder due to known physiological condition: Secondary | ICD-10-CM | POA: Diagnosis not present

## 2022-08-20 DIAGNOSIS — R419 Unspecified symptoms and signs involving cognitive functions and awareness: Secondary | ICD-10-CM | POA: Diagnosis not present

## 2022-08-20 DIAGNOSIS — Z66 Do not resuscitate: Secondary | ICD-10-CM | POA: Diagnosis not present

## 2022-08-20 DIAGNOSIS — R8271 Bacteriuria: Secondary | ICD-10-CM | POA: Diagnosis not present

## 2022-08-20 DIAGNOSIS — Z794 Long term (current) use of insulin: Secondary | ICD-10-CM | POA: Diagnosis not present

## 2022-08-20 DIAGNOSIS — R531 Weakness: Secondary | ICD-10-CM | POA: Diagnosis not present

## 2022-08-20 DIAGNOSIS — R404 Transient alteration of awareness: Secondary | ICD-10-CM | POA: Diagnosis not present

## 2022-08-20 DIAGNOSIS — Z1152 Encounter for screening for COVID-19: Secondary | ICD-10-CM | POA: Diagnosis not present

## 2022-08-20 DIAGNOSIS — F411 Generalized anxiety disorder: Secondary | ICD-10-CM | POA: Diagnosis not present

## 2022-08-20 DIAGNOSIS — E876 Hypokalemia: Secondary | ICD-10-CM | POA: Diagnosis not present

## 2022-08-20 DIAGNOSIS — E1310 Other specified diabetes mellitus with ketoacidosis without coma: Secondary | ICD-10-CM | POA: Diagnosis not present

## 2022-08-20 DIAGNOSIS — R739 Hyperglycemia, unspecified: Secondary | ICD-10-CM | POA: Diagnosis not present

## 2022-08-20 DIAGNOSIS — Z515 Encounter for palliative care: Secondary | ICD-10-CM | POA: Diagnosis not present

## 2022-08-20 DIAGNOSIS — R41841 Cognitive communication deficit: Secondary | ICD-10-CM | POA: Diagnosis not present

## 2022-08-20 DIAGNOSIS — E1110 Type 2 diabetes mellitus with ketoacidosis without coma: Secondary | ICD-10-CM | POA: Diagnosis not present

## 2022-08-20 DIAGNOSIS — I252 Old myocardial infarction: Secondary | ICD-10-CM | POA: Diagnosis not present

## 2022-08-20 DIAGNOSIS — R2689 Other abnormalities of gait and mobility: Secondary | ICD-10-CM | POA: Diagnosis not present

## 2022-08-20 DIAGNOSIS — Z9981 Dependence on supplemental oxygen: Secondary | ICD-10-CM | POA: Diagnosis not present

## 2022-08-20 DIAGNOSIS — E785 Hyperlipidemia, unspecified: Secondary | ICD-10-CM | POA: Diagnosis not present

## 2022-08-20 DIAGNOSIS — Z950 Presence of cardiac pacemaker: Secondary | ICD-10-CM | POA: Diagnosis not present

## 2022-08-20 DIAGNOSIS — Z681 Body mass index (BMI) 19 or less, adult: Secondary | ICD-10-CM | POA: Diagnosis not present

## 2022-08-20 DIAGNOSIS — R627 Adult failure to thrive: Secondary | ICD-10-CM | POA: Diagnosis not present

## 2022-08-20 DIAGNOSIS — R5381 Other malaise: Secondary | ICD-10-CM | POA: Diagnosis not present

## 2022-08-20 DIAGNOSIS — M6250 Muscle wasting and atrophy, not elsewhere classified, unspecified site: Secondary | ICD-10-CM | POA: Diagnosis not present

## 2022-08-20 DIAGNOSIS — E860 Dehydration: Secondary | ICD-10-CM | POA: Diagnosis not present

## 2022-08-20 DIAGNOSIS — J9601 Acute respiratory failure with hypoxia: Secondary | ICD-10-CM | POA: Diagnosis not present

## 2022-08-20 DIAGNOSIS — I110 Hypertensive heart disease with heart failure: Secondary | ICD-10-CM | POA: Diagnosis not present

## 2022-08-20 DIAGNOSIS — R1311 Dysphagia, oral phase: Secondary | ICD-10-CM | POA: Diagnosis not present

## 2022-08-20 DIAGNOSIS — Z951 Presence of aortocoronary bypass graft: Secondary | ICD-10-CM | POA: Diagnosis not present

## 2022-08-20 DIAGNOSIS — Z789 Other specified health status: Secondary | ICD-10-CM | POA: Diagnosis not present

## 2022-08-20 DIAGNOSIS — I700 Atherosclerosis of aorta: Secondary | ICD-10-CM | POA: Diagnosis not present

## 2022-08-20 DIAGNOSIS — Z7901 Long term (current) use of anticoagulants: Secondary | ICD-10-CM | POA: Diagnosis not present

## 2022-08-20 DIAGNOSIS — D696 Thrombocytopenia, unspecified: Secondary | ICD-10-CM | POA: Diagnosis not present

## 2022-08-20 DIAGNOSIS — R54 Age-related physical debility: Secondary | ICD-10-CM | POA: Diagnosis not present

## 2022-08-20 LAB — GLUCOSE, CAPILLARY
Glucose-Capillary: 87 mg/dL (ref 70–99)
Glucose-Capillary: 219 mg/dL — ABNORMAL HIGH (ref 70–99)
Glucose-Capillary: 172 mg/dL — ABNORMAL HIGH (ref 70–99)

## 2022-08-20 MED ORDER — ENSURE ENLIVE PO LIQD: 237.0000 mL | Freq: Two times a day (BID) | ORAL | 12 refills | Status: AC

## 2022-08-20 MED ORDER — INSULIN DETEMIR 100 UNIT/ML ~~LOC~~ SOLN
18.0000 [IU] | Freq: Every day | SUBCUTANEOUS | Status: DC
  Administered 2022-08-20: 18 [IU] via SUBCUTANEOUS
  Filled 2022-08-20: qty 0.18

## 2022-08-20 MED ORDER — VITAMIN B-12 1000 MCG PO TABS: 1000.0000 ug | ORAL_TABLET | Freq: Every day | ORAL | Status: AC

## 2022-08-20 MED ORDER — CYANOCOBALAMIN 1000 MCG/ML IJ SOLN: 1000.0000 ug | Freq: Every day | INTRAMUSCULAR | 0 refills | Status: AC

## 2022-08-20 MED ORDER — CYANOCOBALAMIN 1000 MCG/ML IJ SOLN
1000.0000 ug | Freq: Every day | INTRAMUSCULAR | Status: DC
  Administered 2022-08-20: 1000 ug via SUBCUTANEOUS
  Filled 2022-08-20: qty 1

## 2022-08-20 MED ORDER — INSULIN ASPART 100 UNIT/ML IJ SOLN: INTRAMUSCULAR | 11 refills | Status: AC

## 2022-08-20 MED ORDER — TRESIBA FLEXTOUCH 100 UNIT/ML ~~LOC~~ SOPN: 15.0000 [IU] | PEN_INJECTOR | Freq: Every day | SUBCUTANEOUS | Status: AC

## 2022-08-20 MED ORDER — FUROSEMIDE 20 MG PO TABS
20.0000 mg | ORAL_TABLET | Freq: Every day | ORAL | Status: DC
  Administered 2022-08-20: 20 mg via ORAL
  Filled 2022-08-20: qty 1

## 2022-08-20 NOTE — Care Management Important Message (Signed)
Important Message  Patient Details  Name: Cynthia [name] MRN: [id number] Date of Birth: 1931/11/25   Medicare Important Message Given:  Yes     Lavaughn Bisig 08/20/2022, 3:00 PM

## 2022-08-20 NOTE — Discharge Summary (Addendum)
PATIENT DETAILS Name: Cynthia [name] Age: 89 y.o. Sex: female Date of Birth: 05-25-1932 MRN: [id number]. Admitting Physician: Marcelyn Bruins, MD MBE:[id number], Annie Main, MD  Admit Date: 08/17/2022 Discharge date: 08/20/2022  Recommendations for Outpatient Follow-up:  Follow up with PCP in 1-2 weeks Please obtain CMP/CBC in one week Outpatient neurology evaluation-for neurocognitive evaluation-probably has undiagnosed dementia. Ensure follow-up with cardiology/heart failure clinic Repeat vitamin B12 in 6 weeks. Incidental finding-lung nodule-repeat CT chest in 18-24 months.  Admitted From:  Home  Disposition: Skilled nursing facility   Discharge Condition: fair  CODE STATUS:   Code Status: Full Code   Diet recommendation:  Diet Order             Diet - low sodium heart healthy           Diet Carb Modified           Diet heart healthy/carb modified Room service appropriate? Yes; Fluid consistency: Thin  Diet effective now                    Brief Summary: Patient is a 89 y.o.  female with history of chronic HFrEF, s/p PCI/CABG in 1998, PAF on anticoagulation, permanent pacemaker implantation-who presented with mechanical fall versus syncope-was found to be in DKA and subsequently admitted to the hospitalist service.   Significant events: 1/8>> admit to [facility]-syncope versus mechanical fall, DKA.   Significant studies: 1/8>> CT head: No acute intracranial abnormality. 1/8>> CT C-spine: No fracture/dislocation 1/9>> CT chest: Bilateral solid pulmonary nodules-repeat CT chest-18-24 months.   Significant microbiology data: None   Procedures: None   Consults: None  Brief Hospital Course: DKA Resolved with IV insulin/IVF Discussed with nephew-she has become more forgetful over the past several weeks-suspicion is that she may have forgotten to take her dose of insulin. Now on SQ insulin   Syncope versus mechanical fall Apparently patient ended up on the  floor this past Saturday night-she has no recollection how she got to the floor. Cardiology interrogated PPM on 1/9-no arrhythmias Continue to mobilize with PT/OT. Neuroimaging negative for any significant issues   Asymptomatic Bacteruria No symp of UTI No need for Rocephin.   DM-2 (A1c 8.8 on 1/9) with uncontrolled hyperglycemia CBGs better-slight hypoglycemic episode on 1/10 Continue Levemir 15 units on discharge-and SSI. Attending MD/PCP at SNF to optimize further.  Chronic HFrEF (EF 30-35% by TTE on 01/26/2022) Euvolemic Continue beta-blocker/Entresto/Imdur/Farxiga Resume diuretics     Persistent atrial fibrillation Paced rhythm Continue amiodarone/Toprol/Eliquis   History of biventricular pacemaker placement   CAD s/p CABG/PCI No chest pain/shortness of breath On beta-blocker/statin-suspect not on aspirin as on Eliquis.   Moderate to severe mitral valve regurgitation Continue outpatient follow-up with cardiology   Lung nodule Repeat CT chest 18-24 months per radiology recommendation  Borderline vitamin B12 deficiency Will begin supplementation Repeat B12 levels in 6 weeks.   Delirium-likely superimposed on undiagnosed dementia/chronic cognitive dysfunction Suspect some amount of chronic cognitive dysfunction at baseline-some mild confusion overnight. May have undiagnosed dementia-per nephew-he has noted cognitive issues for the past several months but worse over the past several weeks.  Suspicion that she may not have taken her medications-especially the insulin-causing DKA. Outpatient neurology evaluation    BMI: Estimated body mass index is 18.99 kg/m as calculated from the following:   Height as of this encounter: '5\' 2"'$  (1.575 m).   Weight as of this encounter: 47.1 kg.    Nutrition Status: Nutrition Problem: Increased nutrient needs Etiology: chronic illness  Signs/Symptoms: estimated needs Interventions: Ensure Enlive (each supplement provides 350kcal and  20 grams of protein), MVI, Liberalize Diet    Discharge Diagnoses:  Principal Problem:   DKA (diabetic ketoacidosis) ([place])  Discharge Instructions:  Activity:  As tolerated with Full fall precautions use walker/cane & assistance as needed   Discharge Instructions     (HEART FAILURE PATIENTS) Call MD:  Anytime you have any of the following symptoms: 1) 3 pound weight gain in 24 hours or 5 pounds in 1 week 2) shortness of breath, with or without a dry hacking cough 3) swelling in the hands, feet or stomach 4) if you have to sleep on extra pillows at night in order to breathe.   Complete by: As directed    Ambulatory referral to Neurology   Complete by: As directed    An appointment is requested in approximately: 4 weeks   Diet - low sodium heart healthy   Complete by: As directed    Diet Carb Modified   Complete by: As directed    Discharge instructions   Complete by: As directed    Follow with Primary MD  Reynold Bowen, MD in 1-2 weeks  Please get a complete blood count and chemistry panel checked by your Primary MD at your next visit, and again as instructed by your Primary MD.  Get Medicines reviewed and adjusted: Please take all your medications with you for your next visit with your Primary MD  Laboratory/radiological data: Please request your Primary MD to go over all hospital tests and procedure/radiological results at the follow up, please ask your Primary MD to get all Hospital records sent to his/her office.  In some cases, they will be blood work, cultures and biopsy results pending at the time of your discharge. Please request that your primary care M.D. follows up on these results.  Also Note the following: If you experience worsening of your admission symptoms, develop shortness of breath, life threatening emergency, suicidal or homicidal thoughts you must seek medical attention immediately by calling 911 or calling your MD immediately  if symptoms less  severe.  You must read complete instructions/literature along with all the possible adverse reactions/side effects for all the Medicines you take and that have been prescribed to you. Take any new Medicines after you have completely understood and accpet all the possible adverse reactions/side effects.   Do not drive when taking Pain medications or sleeping medications (Benzodaizepines)  Do not take more than prescribed Pain, Sleep and Anxiety Medications. It is not advisable to combine anxiety,sleep and pain medications without talking with your primary care practitioner  Special Instructions: If you have smoked or chewed Tobacco  in the last 2 yrs please stop smoking, stop any regular Alcohol  and or any Recreational drug use.  Wear Seat belts while driving.  Please note: You were cared for by a hospitalist during your hospital stay. Once you are discharged, your primary care physician will handle any further medical issues. Please note that NO REFILLS for any discharge medications will be authorized once you are discharged, as it is imperative that you return to your primary care physician (or establish a relationship with a primary care physician if you do not have one) for your post hospital discharge needs so that they can reassess your need for medications and monitor your lab values.   CBGs before meals and at bedtime   Increase activity slowly   Complete by: As directed       Allergies  as of 08/20/2022       Reactions   Hydrochlorothiazide    Other reaction(s): hyponatremia        Medication List     STOP taking these medications    carvedilol 6.25 MG tablet Commonly known as: COREG   NovoLOG FlexPen 100 UNIT/ML FlexPen Generic drug: insulin aspart Replaced by: insulin aspart 100 UNIT/ML injection   sitaGLIPtin-metformin 50-1000 MG tablet Commonly known as: JANUMET       TAKE these medications    amiodarone 200 MG tablet Commonly known as: PACERONE Take 200  mg by mouth daily. No medication on Sunday   amoxicillin 500 MG capsule Commonly known as: AMOXIL TAKE 4 CAPSULES BY MOUTH 1 HOUR PRIOR TO PROCEDURE   apixaban 2.5 MG Tabs tablet Commonly known as: ELIQUIS Take 1 tablet (2.5 mg total) by mouth 2 (two) times daily.   ascorbic acid 500 MG tablet Commonly known as: VITAMIN C Take 500 mg by mouth every evening.   atorvastatin 40 MG tablet Commonly known as: LIPITOR TAKE 1 TABLET DAILY   CALCIUM 600 + D PO Take 1 tablet by mouth 2 (two) times daily.   CoQ10 200 MG Caps Take 200 mg by mouth every evening.   cyanocobalamin 1000 MCG/ML injection Commonly known as: VITAMIN B12 Inject 1 mL (1,000 mcg total) into the skin daily for 7 days.   cyanocobalamin 1000 MCG tablet Commonly known as: VITAMIN B12 Take 1 tablet (1,000 mcg total) by mouth daily. Start taking on: August 28, 2022   dapagliflozin propanediol 10 MG Tabs tablet Commonly known as: Farxiga Take 1 tablet (10 mg total) by mouth daily before breakfast.   Entresto 49-51 MG Generic drug: sacubitril-valsartan Take 1 tablet by mouth 2 (two) times daily.   ezetimibe 10 MG tablet Commonly known as: ZETIA TAKE 1 TABLET DAILY   feeding supplement Liqd Take 237 mLs by mouth 2 (two) times daily between meals.   furosemide 20 MG tablet Commonly known as: LASIX TAKE 1 TABLET BY MOUTH EVERY DAY   insulin aspart 100 UNIT/ML injection Commonly known as: novoLOG 0-15 Units, Subcutaneous, 3 times daily with meals CBG < 70: Implement Hypoglycemia measures CBG 70 - 120: 0 units CBG 121 - 150: 2 units CBG 151 - 200: 3 units CBG 201 - 250: 5 units CBG 251 - 300: 8 units CBG 301 - 350: 11 units CBG 351 - 400: 15 units CBG > 400: call MD Replaces: NovoLOG FlexPen 100 UNIT/ML FlexPen   isosorbide mononitrate 60 MG 24 hr tablet Commonly known as: IMDUR TAKE 1 TABLET BY MOUTH EVERY DAY   Magnesium 400 MG Caps Take 400 mg by mouth daily in the afternoon.   metoprolol succinate  25 MG 24 hr tablet Commonly known as: Toprol XL Take 1 tablet (25 mg total) by mouth daily.   multivitamin tablet Take 1 tablet by mouth every morning.   nitroGLYCERIN 0.4 MG SL tablet Commonly known as: NITROSTAT Place 1 tablet (0.4 mg total) under the tongue every 5 (five) minutes as needed for chest pain.   ondansetron 4 MG tablet Commonly known as: ZOFRAN Take 4 mg by mouth 2 (two) times daily.   spironolactone 25 MG tablet Commonly known as: ALDACTONE TAKE 1/2 TABLET BY MOUTH EVERY DAY   SYSTANE OP Apply 1 drop to eye 2 (two) times daily as needed (dry/scratchy eyes).   Tyler Aas FlexTouch 100 UNIT/ML FlexTouch Pen Generic drug: insulin degludec Inject 15 Units into the skin daily. What changed: how  much to take        Contact information for follow-up providers     Reynold Bowen, MD. Schedule an appointment as soon as possible for a visit in 1 week(s).   Specialty: Endocrinology Contact information: [place] [address] [phone]         [facility] Follow up.   Specialty: Neurology Why: Hospital follow up, Office will call with date/time, If you dont hear from them,please give them a call Contact information: [address] [place], [place], DO. Schedule an appointment as soon as possible for a visit in 2 week(s).   Specialty: Cardiology Contact information: [place] [address] [phone]              Contact information for after-discharge care     [facility] .   Service: Skilled Nursing Contact information: [place] [address] [phone]                    Allergies  Allergen Reactions   Hydrochlorothiazide     Other reaction(s): hyponatremia     Other Procedures/Studies: CT CHEST WO  CONTRAST  Result Date: 08/18/2022 CLINICAL DATA:  Follow-up abnormal chest x-ray EXAM: CT CHEST WITHOUT CONTRAST TECHNIQUE: Multidetector CT imaging of the chest was performed following the standard protocol without IV contrast. RADIATION DOSE REDUCTION: This exam was performed according to the departmental dose-optimization program which includes automated exposure control, adjustment of the mA and/or kV according to patient size and/or use of iterative reconstruction technique. COMPARISON:  Chest x-ray dated August 17, 2022 FINDINGS: Cardiovascular: Normal heart size. No pericardial effusion. Mitral annular calcifications. Severe left main and three-vessel coronary artery calcifications status post CABG. Normal caliber thoracic aorta with severe calcified plaque. Mediastinum/Nodes: Small hiatal hernia. Thyroid is unremarkable. No pathologically enlarged lymph nodes seen in the chest. Lungs/Pleura: Central airways are patent. Trace bilateral pleural effusions and bibasilar atelectasis. Bilateral solid pulmonary nodules. Largest is a solid pulmonary nodule left lower lobe measuring 6 mm on series 4, image 107. Nodular opacity of the right lower lung described on prior chest x-ray favored to be due to overlapping osseous and vascular shadows. Upper Abdomen: Moderate thickening of the partially visualized left adrenal gland. Musculoskeletal: No chest wall mass or suspicious bone lesions identified. IMPRESSION: 1. Nodular opacity of the right lower lung described on prior chest x-ray favored to be due to overlapping osseous and vascular shadows. 2. Bilateral solid pulmonary nodules, largest is a solid pulmonary nodule of the left lower lobe measuring 6 mm. Non-contrast chest CT at 3-6 months is recommended. If the nodules are stable at time of repeat CT, then future CT at 18-24 months (from today's scan) is considered optional for low-risk patients, but is recommended for high-risk patients. This recommendation  follows the consensus statement: Guidelines for Management of Incidental Pulmonary Nodules Detected on CT Images: From the Fleischner Society 2017; Radiology 2017; 284:228-243. 3. Moderate thickening of the partially visualized left. Consider dedicated adrenal protocol CT exclude underlying adrenal nodules. This can be performed non emergently. 4. Aortic Atherosclerosis (ICD10-I70.0). Electronically Signed   By: Yetta Glassman M.D.   On: 08/18/2022 09:00   CT Head Wo Contrast  Result Date: 08/17/2022 CLINICAL DATA:  Head and neck trauma. EXAM: CT HEAD WITHOUT CONTRAST CT CERVICAL SPINE WITHOUT CONTRAST TECHNIQUE:  Multidetector CT imaging of the head and cervical spine was performed following the standard protocol without intravenous contrast. Multiplanar CT image reconstructions of the cervical spine were also generated. RADIATION DOSE REDUCTION: This exam was performed according to the departmental dose-optimization program which includes automated exposure control, adjustment of the mA and/or kV according to patient size and/or use of iterative reconstruction technique. COMPARISON:  None Available. FINDINGS: CT HEAD FINDINGS Brain: No evidence of acute infarction, hemorrhage, hydrocephalus, extra-axial collection or mass lesion/mass effect. Mild generalized cerebral atrophy and chronic microvascular ischemic changes of the white matter. Vascular: No hyperdense vessel or unexpected calcification. Skull: Normal. Negative for fracture or focal lesion. Sinuses/Orbits: No acute finding. Other: None. CT CERVICAL SPINE FINDINGS Alignment: Mild retrolisthesis at C4. Mild anterolisthesis at C7. Straightening of the cervical spine. Skull base and vertebrae: No acute fracture. No primary bone lesion or focal pathologic process. Soft tissues and spinal canal: No prevertebral fluid or swelling. No visible canal hematoma. Disc levels: Multilevel degenerate disc disease with disc height loss and small marginal osteophytes.  C2-C3: Uncovertebral joint arthropathy with mild left neural foraminal stenosis. C3-C4: Disc height loss and uncovertebral joint arthropathy with mild left and moderate right neural foraminal stenosis. C4-C5: Disc height loss and uncovertebral joint arthropathy with moderate bilateral neural foraminal stenosis. C5-C6: Disc height loss and uncovertebral joint arthropathy with moderate left and severe right neural foraminal stenosis. C6-C7: Disc height loss with uncovertebral joint arthropathy and mild right and moderate left neural foraminal stenosis. C7-T1: Bilateral facet joint arthropathy. No significant spinal canal or neural foraminal stenosis. Upper chest: Biapical pleural/parenchymal scarring. Other: None IMPRESSION: CT HEAD: 1. No acute intracranial abnormality. 2. Mild generalized cerebral atrophy and chronic microvascular ischemic changes of the white matter. CT CERVICAL SPINE: 1. No acute fracture or traumatic subluxation. 2. Multilevel degenerate disc disease with disc height loss and uncovertebral joint arthropathy. Electronically Signed   By: Keane Police D.O.   On: 08/17/2022 12:07   CT Cervical Spine Wo Contrast  Result Date: 08/17/2022 CLINICAL DATA:  Head and neck trauma. EXAM: CT HEAD WITHOUT CONTRAST CT CERVICAL SPINE WITHOUT CONTRAST TECHNIQUE: Multidetector CT imaging of the head and cervical spine was performed following the standard protocol without intravenous contrast. Multiplanar CT image reconstructions of the cervical spine were also generated. RADIATION DOSE REDUCTION: This exam was performed according to the departmental dose-optimization program which includes automated exposure control, adjustment of the mA and/or kV according to patient size and/or use of iterative reconstruction technique. COMPARISON:  None Available. FINDINGS: CT HEAD FINDINGS Brain: No evidence of acute infarction, hemorrhage, hydrocephalus, extra-axial collection or mass lesion/mass effect. Mild generalized  cerebral atrophy and chronic microvascular ischemic changes of the white matter. Vascular: No hyperdense vessel or unexpected calcification. Skull: Normal. Negative for fracture or focal lesion. Sinuses/Orbits: No acute finding. Other: None. CT CERVICAL SPINE FINDINGS Alignment: Mild retrolisthesis at C4. Mild anterolisthesis at C7. Straightening of the cervical spine. Skull base and vertebrae: No acute fracture. No primary bone lesion or focal pathologic process. Soft tissues and spinal canal: No prevertebral fluid or swelling. No visible canal hematoma. Disc levels: Multilevel degenerate disc disease with disc height loss and small marginal osteophytes. C2-C3: Uncovertebral joint arthropathy with mild left neural foraminal stenosis. C3-C4: Disc height loss and uncovertebral joint arthropathy with mild left and moderate right neural foraminal stenosis. C4-C5: Disc height loss and uncovertebral joint arthropathy with moderate bilateral neural foraminal stenosis. C5-C6: Disc height loss and uncovertebral joint arthropathy with moderate left and severe right  neural foraminal stenosis. C6-C7: Disc height loss with uncovertebral joint arthropathy and mild right and moderate left neural foraminal stenosis. C7-T1: Bilateral facet joint arthropathy. No significant spinal canal or neural foraminal stenosis. Upper chest: Biapical pleural/parenchymal scarring. Other: None IMPRESSION: CT HEAD: 1. No acute intracranial abnormality. 2. Mild generalized cerebral atrophy and chronic microvascular ischemic changes of the white matter. CT CERVICAL SPINE: 1. No acute fracture or traumatic subluxation. 2. Multilevel degenerate disc disease with disc height loss and uncovertebral joint arthropathy. Electronically Signed   By: Keane Police D.O.   On: 08/17/2022 12:07   DG Chest 1 View  Result Date: 08/17/2022 CLINICAL DATA:  [place] EXAM: CHEST  1 VIEW COMPARISON:  Chest x-ray dated December 12th 2022 FINDINGS: Cardiac and  mediastinal contours are unchanged post median sternotomy and CABG. Left chest wall biventricular pacer with leads unchanged in position. New nodular opacity of the lower right lung, likely due to overlapping osseous and vascular shadows. Lungs are otherwise clear. Pleural effusion or pneumothorax. IMPRESSION: New nodular opacity of the lower right lung, likely due to overlapping osseous and vascular shadows, although pulmonary nodule can not be excluded. Recommend PA and lateral chest x-ray for further evaluation, and if finding persists CT is recommended for further evaluation. Lungs are otherwise clear. Electronically Signed   By: Yetta Glassman M.D.   On: 08/17/2022 11:45     TODAY-DAY OF DISCHARGE:  Subjective:   Cynthia [name] today has no headache,no chest abdominal pain,no new weakness tingling or numbness, feels much better wants to go home today.   Objective:   Blood pressure (!) 101/50, pulse 60, temperature 98.8 F (37.1 C), temperature source Oral, resp. rate 19, height '5\' 2"'$  (1.575 m), weight 47.1 kg, SpO2 95 %.  Intake/Output Summary (Last 24 hours) at 08/20/2022 1411 Last data filed at 08/19/2022 2200 Gross per 24 hour  Intake --  Output 600 ml  Net -600 ml   Filed Weights   08/17/22 1031 08/17/22 2300 08/19/22 0500  Weight: 44 kg 44.6 kg 47.1 kg    Exam: Awake Alert, Oriented *3, No new F.N deficits, Normal affect [place].AT,PERRAL Supple Neck,No JVD, No cervical lymphadenopathy appriciated.  Symmetrical Chest wall movement, Good air movement bilaterally, CTAB RRR,No Gallops,Rubs or new Murmurs, No Parasternal Heave +ve B.Sounds, Abd Soft, Non tender, No organomegaly appriciated, No rebound -guarding or rigidity. No Cyanosis, Clubbing or edema, No new Rash or bruise   PERTINENT RADIOLOGIC STUDIES: No results found.   PERTINENT LAB RESULTS: CBC: Recent Labs    08/17/22 1445 08/19/22 0411  WBC  --  4.4  HGB 13.9 12.0  HCT 41.0 36.3  PLT  --  157    CMET CMP     Component Value Date/Time   NA 141 08/19/2022 0411   NA 132 (L) 12/25/2021 0957   K 3.7 08/19/2022 0411   CL 106 08/19/2022 0411   CO2 26 08/19/2022 0411   GLUCOSE 272 (H) 08/19/2022 0411   BUN 20 08/19/2022 0411   BUN 8 12/25/2021 0957   CREATININE 0.70 08/19/2022 0411   CREATININE 0.64 08/06/2016 1124   CALCIUM 11.2 (H) 08/19/2022 0411   PROT 5.8 (L) 08/19/2022 0411   PROT 6.4 12/25/2021 0957   ALBUMIN 3.1 (L) 08/19/2022 0411   ALBUMIN 4.5 12/25/2021 0957   AST 41 08/19/2022 0411   ALT 25 08/19/2022 0411   ALKPHOS 84 08/19/2022 0411   BILITOT 0.8 08/19/2022 0411   BILITOT 1.3 (H) 12/25/2021 0957   GFRNONAA >60 08/19/2022  0411   GFRAA 95 12/15/2019 1051    GFR Estimated Creatinine Clearance: 36.1 mL/min (by C-G formula based on SCr of 0.7 mg/dL). No results for input(s): "LIPASE", "AMYLASE" in the last 72 hours.  No results for input(s): "CKTOTAL", "CKMB", "CKMBINDEX", "TROPONINI" in the last 72 hours.  Invalid input(s): "POCBNP" No results for input(s): "DDIMER" in the last 72 hours. Recent Labs    08/18/22 0533  HGBA1C 8.8*   Recent Labs    08/18/22 0610  CHOL 149  HDL 23*  LDLCALC 99  TRIG 135  CHOLHDL 6.5   No results for input(s): "TSH", "T4TOTAL", "T3FREE", "THYROIDAB" in the last 72 hours.  Invalid input(s): "FREET3" Recent Labs    08/18/22 0517 08/18/22 0610 08/18/22 0857  VITAMINB12  --   --  291  FOLATE  --  10.0  --   FERRITIN  --  351*  --   TIBC  --  224*  --   IRON  --  50  --   RETICCTPCT 1.5  --   --    Coags: No results for input(s): "INR" in the last 72 hours.  Invalid input(s): "PT" Microbiology: No results found for this or any previous visit (from the past 240 hour(s)).  FURTHER DISCHARGE INSTRUCTIONS:  Get Medicines reviewed and adjusted: Please take all your medications with you for your next visit with your Primary MD  Laboratory/radiological data: Please request your Primary MD to go over all  hospital tests and procedure/radiological results at the follow up, please ask your Primary MD to get all Hospital records sent to his/her office.  In some cases, they will be blood work, cultures and biopsy results pending at the time of your discharge. Please request that your primary care M.D. goes through all the records of your hospital data and follows up on these results.  Also Note the following: If you experience worsening of your admission symptoms, develop shortness of breath, life threatening emergency, suicidal or homicidal thoughts you must seek medical attention immediately by calling 911 or calling your MD immediately  if symptoms less severe.  You must read complete instructions/literature along with all the possible adverse reactions/side effects for all the Medicines you take and that have been prescribed to you. Take any new Medicines after you have completely understood and accpet all the possible adverse reactions/side effects.   Do not drive when taking Pain medications or sleeping medications (Benzodaizepines)  Do not take more than prescribed Pain, Sleep and Anxiety Medications. It is not advisable to combine anxiety,sleep and pain medications without talking with your primary care practitioner  Special Instructions: If you have smoked or chewed Tobacco  in the last 2 yrs please stop smoking, stop any regular Alcohol  and or any Recreational drug use.  Wear Seat belts while driving.  Please note: You were cared for by a hospitalist during your hospital stay. Once you are discharged, your primary care physician will handle any further medical issues. Please note that NO REFILLS for any discharge medications will be authorized once you are discharged, as it is imperative that you return to your primary care physician (or establish a relationship with a primary care physician if you do not have one) for your post hospital discharge needs so that they can reassess your need for  medications and monitor your lab values.  Total Time spent coordinating discharge including counseling, education and face to face time equals greater than 30 minutes.  SignedOren Binet 08/20/2022 2:11 PM

## 2022-08-20 NOTE — TOC Transition Note (Signed)
Transition of Care [facility]) - CM/SW Discharge Note   Patient Details  Name: EURA MCCAUSLIN MRN: [id number] Date of Birth: 04/29/1932  Transition of Care [facility]) CM/SW Contact:  Benard Halsted, LCSW Phone Number: 08/20/2022, 1:57 PM   Clinical Narrative:    Patient will DC to: [facility] Anticipated DC date: 08/20/22 Family notified: Cindie Laroche Transport by: Corey Harold   Per MD patient ready for DC to [facility]. RN to call report prior to discharge [phone] room 604p). RN, patient, patient's family, and facility notified of DC. Discharge Summary and FL2 sent to facility. DC packet on chart. Ambulance transport requested for patient.   CSW will sign off for now as social work intervention is no longer needed. Please consult [place] again if new needs arise.     Final next level of care: Skilled Nursing Facility Barriers to Discharge: Barriers Resolved   Patient Goals and CMS Choice CMS Medicare.gov Compare Post Acute Care list provided to:: Patient Represenative (must comment) Choice offered to / list presented to : [place] / Guardian  Discharge Placement     Existing PASRR number confirmed : 08/20/22          Patient chooses bed at: [facility] Patient to be transferred to facility by: [place] Name of family member notified: Nephew Patient and family notified of of transfer: 08/20/22  Discharge Plan and Services Additional resources added to the After Visit Summary for   In-house Referral: Clinical Social Work   Post Acute Care Choice: [place]                               Social Determinants of Health ([place]) Interventions [place]: No Food Insecurity (08/17/2022)  Housing: Low Risk  (08/17/2022)  Transportation Needs: No Transportation Needs (08/17/2022)  Utilities: Not At Risk (08/17/2022)  Tobacco Use: Low Risk  (08/17/2022)     Readmission Risk Interventions     No data to display

## 2022-08-20 NOTE — TOC Progression Note (Signed)
Transition of Care [facility]) - Progression Note    Patient Details  Name: Cynthia [name] MRN: [id number] Date of Birth: 03/23/1932  Transition of Care [facility]) CM/SW [place], LCSW Phone Number: 08/20/2022, 11:12 AM  Clinical Narrative:    CSW spoke with patient's nephew. He would like [place]. CSW updated [place] and will coordinate PTAR.    Expected Discharge Plan: [place] Barriers to Discharge: Barriers resolved  Expected Discharge Plan and Services In-house Referral: Clinical Social Work   Post Acute Care Choice: [place] Living arrangements for the past 2 months: [place] Expected Discharge Date: 08/20/22                                     Social Determinants of Health (SDOH) Interventions SDOH Screenings   Food Insecurity: No Food Insecurity (08/17/2022)  Housing: Low Risk  (08/17/2022)  Transportation Needs: No Transportation Needs (08/17/2022)  Utilities: Not At Risk (08/17/2022)  Tobacco Use: Low Risk  (08/17/2022)    Readmission Risk Interventions     No data to display

## 2022-08-21 ENCOUNTER — Other Ambulatory Visit: Payer: Self-pay | Admitting: *Deleted

## 2022-08-21 DIAGNOSIS — E1110 Type 2 diabetes mellitus with ketoacidosis without coma: Secondary | ICD-10-CM | POA: Diagnosis not present

## 2022-08-21 DIAGNOSIS — Z794 Long term (current) use of insulin: Secondary | ICD-10-CM | POA: Diagnosis not present

## 2022-08-21 DIAGNOSIS — I482 Chronic atrial fibrillation, unspecified: Secondary | ICD-10-CM | POA: Diagnosis not present

## 2022-08-21 DIAGNOSIS — I2510 Atherosclerotic heart disease of native coronary artery without angina pectoris: Secondary | ICD-10-CM | POA: Diagnosis not present

## 2022-08-21 DIAGNOSIS — R8271 Bacteriuria: Secondary | ICD-10-CM | POA: Diagnosis not present

## 2022-08-21 DIAGNOSIS — R911 Solitary pulmonary nodule: Secondary | ICD-10-CM | POA: Diagnosis not present

## 2022-08-21 DIAGNOSIS — R419 Unspecified symptoms and signs involving cognitive functions and awareness: Secondary | ICD-10-CM | POA: Diagnosis not present

## 2022-08-21 DIAGNOSIS — Z951 Presence of aortocoronary bypass graft: Secondary | ICD-10-CM | POA: Diagnosis not present

## 2022-08-21 DIAGNOSIS — L89152 Pressure ulcer of sacral region, stage 2: Secondary | ICD-10-CM | POA: Diagnosis not present

## 2022-08-21 DIAGNOSIS — E1165 Type 2 diabetes mellitus with hyperglycemia: Secondary | ICD-10-CM | POA: Diagnosis not present

## 2022-08-21 DIAGNOSIS — M6259 Muscle wasting and atrophy, not elsewhere classified, multiple sites: Secondary | ICD-10-CM | POA: Diagnosis not present

## 2022-08-21 DIAGNOSIS — I5022 Chronic systolic (congestive) heart failure: Secondary | ICD-10-CM | POA: Diagnosis not present

## 2022-08-21 DIAGNOSIS — R55 Syncope and collapse: Secondary | ICD-10-CM | POA: Diagnosis not present

## 2022-08-21 DIAGNOSIS — I252 Old myocardial infarction: Secondary | ICD-10-CM | POA: Diagnosis not present

## 2022-08-21 DIAGNOSIS — Z681 Body mass index (BMI) 19 or less, adult: Secondary | ICD-10-CM | POA: Diagnosis not present

## 2022-08-21 DIAGNOSIS — Z7901 Long term (current) use of anticoagulants: Secondary | ICD-10-CM | POA: Diagnosis not present

## 2022-08-21 NOTE — Patient Outreach (Signed)
Per [facility] Mrs. Weatherall recently admitted to [facility]. Appears she is active with [facility] care coordination team as benefit of insurance plan and Primary Care Provider.   Will collaborate with social work team at [employer] about transition plans/needs.   Will keep [place] care coordination team updated.   Marthenia Rolling, MSN, RN,BSN [place] Acute Care Coordinator [phone] (Direct dial)

## 2022-08-24 DIAGNOSIS — F0390 Unspecified dementia without behavioral disturbance: Secondary | ICD-10-CM | POA: Diagnosis not present

## 2022-08-24 DIAGNOSIS — F411 Generalized anxiety disorder: Secondary | ICD-10-CM | POA: Diagnosis not present

## 2022-08-25 ENCOUNTER — Other Ambulatory Visit: Payer: Self-pay | Admitting: *Deleted

## 2022-08-25 DIAGNOSIS — R531 Weakness: Secondary | ICD-10-CM | POA: Diagnosis not present

## 2022-08-25 DIAGNOSIS — J9601 Acute respiratory failure with hypoxia: Secondary | ICD-10-CM | POA: Diagnosis not present

## 2022-08-25 DIAGNOSIS — I959 Hypotension, unspecified: Secondary | ICD-10-CM | POA: Diagnosis not present

## 2022-08-25 DIAGNOSIS — Z7982 Long term (current) use of aspirin: Secondary | ICD-10-CM | POA: Diagnosis not present

## 2022-08-25 DIAGNOSIS — I69322 Dysarthria following cerebral infarction: Secondary | ICD-10-CM | POA: Diagnosis not present

## 2022-08-25 DIAGNOSIS — Z9981 Dependence on supplemental oxygen: Secondary | ICD-10-CM | POA: Diagnosis not present

## 2022-08-25 DIAGNOSIS — L89152 Pressure ulcer of sacral region, stage 2: Secondary | ICD-10-CM | POA: Diagnosis not present

## 2022-08-25 DIAGNOSIS — Z7901 Long term (current) use of anticoagulants: Secondary | ICD-10-CM | POA: Diagnosis not present

## 2022-08-25 DIAGNOSIS — Z794 Long term (current) use of insulin: Secondary | ICD-10-CM | POA: Diagnosis not present

## 2022-08-25 DIAGNOSIS — E1165 Type 2 diabetes mellitus with hyperglycemia: Secondary | ICD-10-CM | POA: Diagnosis not present

## 2022-08-25 DIAGNOSIS — R404 Transient alteration of awareness: Secondary | ICD-10-CM | POA: Diagnosis not present

## 2022-08-25 DIAGNOSIS — R8271 Bacteriuria: Secondary | ICD-10-CM | POA: Diagnosis not present

## 2022-08-25 NOTE — Patient Outreach (Signed)
[place] Coordinator follow up. Mrs. Stormes resides in [facility] skilled nursing facility. Active with [facility] care coordination team as benefit of Primary Care Provider and insurance plan.   Update received from [place], [place] Education officer, museum. Anticipated transition plan is ALF/Memory Care.   Will update [facility] care coordination team.   Marthenia Rolling, MSN, RN,BSN [place] Acute Care Coordinator [phone] (Direct dial)

## 2022-08-26 ENCOUNTER — Inpatient Hospital Stay (HOSPITAL_COMMUNITY)
Admission: EM | Admit: 2022-08-26 | Discharge: 2022-09-10 | DRG: 951 | Disposition: E | Payer: Medicare Other | Source: Skilled Nursing Facility | Attending: Internal Medicine | Admitting: Internal Medicine

## 2022-08-26 ENCOUNTER — Emergency Department (HOSPITAL_COMMUNITY): Payer: Medicare Other

## 2022-08-26 ENCOUNTER — Other Ambulatory Visit: Payer: Self-pay

## 2022-08-26 DIAGNOSIS — Z9181 History of falling: Secondary | ICD-10-CM

## 2022-08-26 DIAGNOSIS — Z789 Other specified health status: Secondary | ICD-10-CM | POA: Diagnosis not present

## 2022-08-26 DIAGNOSIS — I2510 Atherosclerotic heart disease of native coronary artery without angina pectoris: Secondary | ICD-10-CM | POA: Diagnosis present

## 2022-08-26 DIAGNOSIS — I480 Paroxysmal atrial fibrillation: Secondary | ICD-10-CM | POA: Diagnosis present

## 2022-08-26 DIAGNOSIS — N179 Acute kidney failure, unspecified: Secondary | ICD-10-CM | POA: Diagnosis present

## 2022-08-26 DIAGNOSIS — I959 Hypotension, unspecified: Secondary | ICD-10-CM | POA: Diagnosis not present

## 2022-08-26 DIAGNOSIS — D696 Thrombocytopenia, unspecified: Secondary | ICD-10-CM | POA: Diagnosis present

## 2022-08-26 DIAGNOSIS — R1312 Dysphagia, oropharyngeal phase: Secondary | ICD-10-CM | POA: Diagnosis not present

## 2022-08-26 DIAGNOSIS — E876 Hypokalemia: Secondary | ICD-10-CM | POA: Diagnosis present

## 2022-08-26 DIAGNOSIS — I110 Hypertensive heart disease with heart failure: Secondary | ICD-10-CM | POA: Diagnosis present

## 2022-08-26 DIAGNOSIS — Z9861 Coronary angioplasty status: Secondary | ICD-10-CM

## 2022-08-26 DIAGNOSIS — Z66 Do not resuscitate: Secondary | ICD-10-CM | POA: Diagnosis present

## 2022-08-26 DIAGNOSIS — I5022 Chronic systolic (congestive) heart failure: Secondary | ICD-10-CM | POA: Diagnosis present

## 2022-08-26 DIAGNOSIS — Z888 Allergy status to other drugs, medicaments and biological substances status: Secondary | ICD-10-CM

## 2022-08-26 DIAGNOSIS — Z823 Family history of stroke: Secondary | ICD-10-CM

## 2022-08-26 DIAGNOSIS — I252 Old myocardial infarction: Secondary | ICD-10-CM

## 2022-08-26 DIAGNOSIS — Z951 Presence of aortocoronary bypass graft: Secondary | ICD-10-CM

## 2022-08-26 DIAGNOSIS — R4182 Altered mental status, unspecified: Secondary | ICD-10-CM | POA: Diagnosis not present

## 2022-08-26 DIAGNOSIS — E119 Type 2 diabetes mellitus without complications: Secondary | ICD-10-CM | POA: Diagnosis present

## 2022-08-26 DIAGNOSIS — E43 Unspecified severe protein-calorie malnutrition: Secondary | ICD-10-CM | POA: Diagnosis not present

## 2022-08-26 DIAGNOSIS — Z96641 Presence of right artificial hip joint: Secondary | ICD-10-CM | POA: Diagnosis present

## 2022-08-26 DIAGNOSIS — I081 Rheumatic disorders of both mitral and tricuspid valves: Secondary | ICD-10-CM | POA: Diagnosis present

## 2022-08-26 DIAGNOSIS — Z950 Presence of cardiac pacemaker: Secondary | ICD-10-CM

## 2022-08-26 DIAGNOSIS — Z794 Long term (current) use of insulin: Secondary | ICD-10-CM

## 2022-08-26 DIAGNOSIS — Z79899 Other long term (current) drug therapy: Secondary | ICD-10-CM

## 2022-08-26 DIAGNOSIS — I700 Atherosclerosis of aorta: Secondary | ICD-10-CM | POA: Diagnosis not present

## 2022-08-26 DIAGNOSIS — Z7901 Long term (current) use of anticoagulants: Secondary | ICD-10-CM

## 2022-08-26 DIAGNOSIS — R627 Adult failure to thrive: Secondary | ICD-10-CM | POA: Diagnosis present

## 2022-08-26 DIAGNOSIS — E860 Dehydration: Secondary | ICD-10-CM | POA: Diagnosis present

## 2022-08-26 DIAGNOSIS — Z681 Body mass index (BMI) 19 or less, adult: Secondary | ICD-10-CM

## 2022-08-26 DIAGNOSIS — R739 Hyperglycemia, unspecified: Secondary | ICD-10-CM | POA: Diagnosis not present

## 2022-08-26 DIAGNOSIS — Z8249 Family history of ischemic heart disease and other diseases of the circulatory system: Secondary | ICD-10-CM

## 2022-08-26 DIAGNOSIS — E785 Hyperlipidemia, unspecified: Secondary | ICD-10-CM | POA: Diagnosis present

## 2022-08-26 DIAGNOSIS — R531 Weakness: Secondary | ICD-10-CM | POA: Diagnosis not present

## 2022-08-26 DIAGNOSIS — Z1152 Encounter for screening for COVID-19: Secondary | ICD-10-CM | POA: Diagnosis not present

## 2022-08-26 DIAGNOSIS — E871 Hypo-osmolality and hyponatremia: Secondary | ICD-10-CM | POA: Diagnosis not present

## 2022-08-26 DIAGNOSIS — G9340 Encephalopathy, unspecified: Secondary | ICD-10-CM | POA: Diagnosis present

## 2022-08-26 DIAGNOSIS — R41841 Cognitive communication deficit: Secondary | ICD-10-CM | POA: Diagnosis not present

## 2022-08-26 DIAGNOSIS — M6250 Muscle wasting and atrophy, not elsewhere classified, unspecified site: Secondary | ICD-10-CM | POA: Diagnosis not present

## 2022-08-26 DIAGNOSIS — Z809 Family history of malignant neoplasm, unspecified: Secondary | ICD-10-CM

## 2022-08-26 DIAGNOSIS — Z515 Encounter for palliative care: Secondary | ICD-10-CM | POA: Diagnosis not present

## 2022-08-26 DIAGNOSIS — R5381 Other malaise: Secondary | ICD-10-CM | POA: Diagnosis not present

## 2022-08-26 DIAGNOSIS — R54 Age-related physical debility: Secondary | ICD-10-CM | POA: Diagnosis present

## 2022-08-26 LAB — CBC WITH DIFFERENTIAL/PLATELET
Abs Immature Granulocytes: 0.12 10*3/uL — ABNORMAL HIGH (ref 0.00–0.07)
Basophils Absolute: 0 10*3/uL (ref 0.0–0.1)
Basophils Relative: 0 %
Eosinophils Absolute: 0 10*3/uL (ref 0.0–0.5)
Eosinophils Relative: 0 %
HCT: 42.4 % (ref 36.0–46.0)
Hemoglobin: 13.2 g/dL (ref 12.0–15.0)
Immature Granulocytes: 1 %
Lymphocytes Relative: 8 %
Lymphs Abs: 0.9 10*3/uL (ref 0.7–4.0)
MCH: 32.5 pg (ref 26.0–34.0)
MCHC: 31.1 g/dL (ref 30.0–36.0)
MCV: 104.4 fL — ABNORMAL HIGH (ref 80.0–100.0)
Monocytes Absolute: 0.6 10*3/uL (ref 0.1–1.0)
Monocytes Relative: 6 %
Neutro Abs: 8.9 10*3/uL — ABNORMAL HIGH (ref 1.7–7.7)
Neutrophils Relative %: 85 %
Platelets: 137 10*3/uL — ABNORMAL LOW (ref 150–400)
RBC: 4.06 MIL/uL (ref 3.87–5.11)
RDW: 16.1 % — ABNORMAL HIGH (ref 11.5–15.5)
WBC: 10.5 10*3/uL (ref 4.0–10.5)
nRBC: 0 % (ref 0.0–0.2)

## 2022-08-26 LAB — COMPREHENSIVE METABOLIC PANEL
ALT: 19 U/L (ref 0–44)
AST: 51 U/L — ABNORMAL HIGH (ref 15–41)
Albumin: 2.7 g/dL — ABNORMAL LOW (ref 3.5–5.0)
Alkaline Phosphatase: 84 U/L (ref 38–126)
Anion gap: 10 (ref 5–15)
BUN: 61 mg/dL — ABNORMAL HIGH (ref 8–23)
CO2: 31 mmol/L (ref 22–32)
Calcium: >15 mg/dL (ref 8.9–10.3)
Chloride: 114 mmol/L — ABNORMAL HIGH (ref 98–111)
Creatinine, Ser: 2.37 mg/dL — ABNORMAL HIGH (ref 0.44–1.00)
GFR, Estimated: 19 mL/min — ABNORMAL LOW (ref >60)
Glucose, Bld: 193 mg/dL — ABNORMAL HIGH (ref 70–99)
Potassium: 2.7 mmol/L — CL (ref 3.5–5.1)
Sodium: 155 mmol/L — ABNORMAL HIGH (ref 135–145)
Total Bilirubin: 0.8 mg/dL (ref 0.3–1.2)
Total Protein: 5.2 g/dL — ABNORMAL LOW (ref 6.5–8.1)

## 2022-08-26 LAB — I-STAT CHEM 8, ED
BUN: 56 mg/dL — ABNORMAL HIGH (ref 8–23)
Calcium, Ion: 2.1 mmol/L (ref 1.15–1.40)
Chloride: 114 mmol/L — ABNORMAL HIGH (ref 98–111)
Creatinine, Ser: 2.5 mg/dL — ABNORMAL HIGH (ref 0.44–1.00)
Glucose, Bld: 177 mg/dL — ABNORMAL HIGH (ref 70–99)
HCT: 41 % (ref 36.0–46.0)
Hemoglobin: 13.9 g/dL (ref 12.0–15.0)
Potassium: 2.8 mmol/L — ABNORMAL LOW (ref 3.5–5.1)
Sodium: 158 mmol/L — ABNORMAL HIGH (ref 135–145)
TCO2: 33 mmol/L — ABNORMAL HIGH (ref 22–32)

## 2022-08-26 LAB — RESP PANEL BY RT-PCR (RSV, FLU A&B, COVID)  RVPGX2
Influenza A by PCR: NEGATIVE
Influenza B by PCR: NEGATIVE
Resp Syncytial Virus by PCR: NEGATIVE
SARS Coronavirus 2 by RT PCR: NEGATIVE

## 2022-08-26 LAB — CBG MONITORING, ED: Glucose-Capillary: 168 mg/dL — ABNORMAL HIGH (ref 70–99)

## 2022-08-26 MED ORDER — LORAZEPAM 1 MG PO TABS: 1.0000 mg | ORAL_TABLET | ORAL | Status: DC | PRN

## 2022-08-26 MED ORDER — DIPHENHYDRAMINE HCL 50 MG/ML IJ SOLN: 12.5000 mg | INTRAMUSCULAR | Status: DC | PRN

## 2022-08-26 MED ORDER — HALOPERIDOL LACTATE 2 MG/ML PO CONC: 2.0000 mg | Freq: Four times a day (QID) | ORAL | Status: DC | PRN

## 2022-08-26 MED ORDER — HYDROMORPHONE BOLUS VIA INFUSION
0.5000 mg | INTRAVENOUS | Status: DC | PRN
  Administered 2022-08-26: 0.5 mg via INTRAVENOUS

## 2022-08-26 MED ORDER — DILTIAZEM HCL-DEXTROSE 125-5 MG/125ML-% IV SOLN (PREMIX): 5.0000 mg/h | INTRAVENOUS | Status: DC

## 2022-08-26 MED ORDER — ONDANSETRON 4 MG PO TBDP: 4.0000 mg | ORAL_TABLET | Freq: Four times a day (QID) | ORAL | Status: DC | PRN

## 2022-08-26 MED ORDER — HALOPERIDOL 0.5 MG PO TABS: 0.5000 mg | ORAL_TABLET | ORAL | Status: DC | PRN

## 2022-08-26 MED ORDER — HALOPERIDOL 1 MG PO TABS: 2.0000 mg | ORAL_TABLET | Freq: Four times a day (QID) | ORAL | Status: DC | PRN

## 2022-08-26 MED ORDER — LORAZEPAM 2 MG/ML PO CONC: 1.0000 mg | ORAL | Status: DC | PRN

## 2022-08-26 MED ORDER — HALOPERIDOL LACTATE 2 MG/ML PO CONC: 0.5000 mg | ORAL | Status: DC | PRN

## 2022-08-26 MED ORDER — SODIUM CHLORIDE 0.9 % IV BOLUS
1000.0000 mL | Freq: Once | INTRAVENOUS | Status: AC
  Administered 2022-08-26: 1000 mL via INTRAVENOUS

## 2022-08-26 MED ORDER — DILTIAZEM LOAD VIA INFUSION
20.0000 mg | Freq: Once | INTRAVENOUS | Status: DC
  Filled 2022-08-26: qty 20

## 2022-08-26 MED ORDER — HYDROMORPHONE BOLUS VIA INFUSION: 0.5000 mg | INTRAVENOUS | Status: DC | PRN

## 2022-08-26 MED ORDER — POLYVINYL ALCOHOL 1.4 % OP SOLN: 1.0000 [drp] | Freq: Four times a day (QID) | OPHTHALMIC | Status: DC | PRN

## 2022-08-26 MED ORDER — POTASSIUM CHLORIDE IN NACL 20-0.9 MEQ/L-% IV SOLN
Freq: Once | INTRAVENOUS | Status: AC
  Filled 2022-08-26: qty 1000

## 2022-08-26 MED ORDER — ONDANSETRON HCL 4 MG/2ML IJ SOLN: 4.0000 mg | Freq: Four times a day (QID) | INTRAMUSCULAR | Status: DC | PRN

## 2022-08-26 MED ORDER — BIOTENE DRY MOUTH MT LIQD: 15.0000 mL | Freq: Two times a day (BID) | OROMUCOSAL | Status: DC

## 2022-08-26 MED ORDER — HALOPERIDOL LACTATE 5 MG/ML IJ SOLN: 2.0000 mg | Freq: Four times a day (QID) | INTRAMUSCULAR | Status: DC | PRN

## 2022-08-26 MED ORDER — SODIUM CHLORIDE 0.9 % IV SOLN: INTRAVENOUS | Status: DC

## 2022-08-26 MED ORDER — GLYCOPYRROLATE 0.2 MG/ML IJ SOLN: 0.2000 mg | INTRAMUSCULAR | Status: DC | PRN

## 2022-08-26 MED ORDER — HYDROMORPHONE HCL-NACL 50-0.9 MG/50ML-% IV SOLN
0.5000 mg/h | INTRAVENOUS | Status: DC
  Administered 2022-08-26: 0.5 mg/h via INTRAVENOUS
  Filled 2022-08-26 (×2): qty 50

## 2022-08-26 MED ORDER — ACETAMINOPHEN 325 MG PO TABS: 650.0000 mg | ORAL_TABLET | Freq: Four times a day (QID) | ORAL | Status: DC | PRN

## 2022-08-26 MED ORDER — LORAZEPAM 2 MG/ML IJ SOLN: 1.0000 mg | INTRAMUSCULAR | Status: DC | PRN

## 2022-08-26 MED ORDER — GLYCOPYRROLATE 1 MG PO TABS: 1.0000 mg | ORAL_TABLET | ORAL | Status: DC | PRN

## 2022-08-26 MED ORDER — ACETAMINOPHEN 650 MG RE SUPP: 650.0000 mg | Freq: Four times a day (QID) | RECTAL | Status: DC | PRN

## 2022-08-26 MED ORDER — HALOPERIDOL LACTATE 5 MG/ML IJ SOLN: 0.5000 mg | INTRAMUSCULAR | Status: DC | PRN

## 2022-08-26 MED ORDER — BIOTENE DRY MOUTH MT LIQD: 15.0000 mL | OROMUCOSAL | Status: DC | PRN

## 2022-08-26 NOTE — ED Notes (Signed)
CMP recollected and sent to lab.  No new orders at this time.

## 2022-08-26 NOTE — ED Notes (Signed)
Critical Calcium greater than 15.  Critical potassium 2.7.  MD Vanita Panda notified and aware.   No new orders at this time.

## 2022-08-26 NOTE — ED Notes (Signed)
Patient transported to CT 

## 2022-08-26 NOTE — ED Provider Notes (Addendum)
[facility] EMERGENCY DEPARTMENT Provider Note   CSN: [id number] Arrival date & time: 08/25/2022  1043     History  Chief Complaint  Patient presents with   Altered Mental Status    Cynthia [name] is a 89 y.o. female.  HPI Presents from nursing facility via EMS.  Patient is listless, level 5 caveat. Per report the patient is typically oriented x 3, walks with a walker.  Over the past 2 days the patient has been less interactive, less verbal, and now requires oxygen, after being found to be hypoxic by nursing home staff.  EMS reports patient was hypotensive as well on their initial evaluation, but improved with fluids provided en route. Patient has MOST form, DNI.    Home Medications Prior to Admission medications   Medication Sig Start Date End Date Taking? Authorizing Provider  amiodarone (PACERONE) 200 MG tablet Take 200 mg by mouth daily. No medication on Sunday    [provider]  amoxicillin (AMOXIL) 500 MG capsule TAKE 4 CAPSULES BY MOUTH 1 HOUR PRIOR TO PROCEDURE Patient not taking: Reported on 08/20/2022 04/24/22   [provider]  apixaban (ELIQUIS) 2.5 MG TABS tablet Take 1 tablet (2.5 mg total) by mouth 2 (two) times daily. 10/23/21   Fenton, Clint R, PA  atorvastatin (LIPITOR) 40 MG tablet TAKE 1 TABLET DAILY Patient taking differently: Take 40 mg by mouth daily. 04/01/22   Nahser, Wonda Cheng, MD  Calcium Carb-Cholecalciferol (CALCIUM 600 + D PO) Take 1 tablet by mouth 2 (two) times daily.    [provider]  Coenzyme Q10 (COQ10) 200 MG CAPS Take 200 mg by mouth every evening.    [provider]  cyanocobalamin (VITAMIN B12) 1000 MCG tablet Take 1 tablet (1,000 mcg total) by mouth daily. 08/28/22   Ghimire, Henreitta Leber, MD  cyanocobalamin (VITAMIN B12) 1000 MCG/ML injection Inject 1 mL (1,000 mcg total) into the skin daily for 7 days. 08/20/22 09/10/2022  Ghimire, Henreitta Leber, MD  dapagliflozin propanediol (FARXIGA) 10 MG TABS  tablet Take 1 tablet (10 mg total) by mouth daily before breakfast. 05/05/22   Sabharwal, Aditya, DO  ezetimibe (ZETIA) 10 MG tablet TAKE 1 TABLET DAILY Patient taking differently: Take 10 mg by mouth daily. 05/07/22   Nahser, Wonda Cheng, MD  feeding supplement (ENSURE ENLIVE / ENSURE PLUS) LIQD Take 237 mLs by mouth 2 (two) times daily between meals. 08/20/22   Ghimire, Henreitta Leber, MD  furosemide (LASIX) 20 MG tablet TAKE 1 TABLET BY MOUTH EVERY DAY 06/01/22   Nahser, Wonda Cheng, MD  insulin aspart (NOVOLOG) 100 UNIT/ML injection 0-15 Units, Subcutaneous, 3 times daily with meals CBG < 70: Implement Hypoglycemia measures CBG 70 - 120: 0 units CBG 121 - 150: 2 units CBG 151 - 200: 3 units CBG 201 - 250: 5 units CBG 251 - 300: 8 units CBG 301 - 350: 11 units CBG 351 - 400: 15 units CBG > 400: call MD 08/20/22   Jonetta Osgood, MD  insulin degludec (TRESIBA FLEXTOUCH) 100 UNIT/ML FlexTouch Pen Inject 15 Units into the skin daily. 08/20/22   Ghimire, Henreitta Leber, MD  isosorbide mononitrate (IMDUR) 60 MG 24 hr tablet TAKE 1 TABLET BY MOUTH EVERY DAY Patient taking differently: Take 60 mg by mouth daily. 06/29/22   Nahser, Wonda Cheng, MD  Magnesium 400 MG CAPS Take 400 mg by mouth daily in the afternoon.    [provider]  metoprolol succinate (TOPROL XL) 25 MG 24 hr  tablet Take 1 tablet (25 mg total) by mouth daily. 12/05/21   Evans Lance, MD  Multiple Vitamin (MULTIVITAMIN) tablet Take 1 tablet by mouth every morning.    [provider]  nitroGLYCERIN (NITROSTAT) 0.4 MG SL tablet Place 1 tablet (0.4 mg total) under the tongue every 5 (five) minutes as needed for chest pain. 01/07/22 05/06/23  Baldwin Jamaica, PA-C  ondansetron (ZOFRAN) 4 MG tablet Take 4 mg by mouth 2 (two) times daily. 08/11/22   [provider]  Polyethyl Glycol-Propyl Glycol (SYSTANE OP) Apply 1 drop to eye 2 (two) times daily as needed (dry/scratchy eyes).    [provider]  sacubitril-valsartan  (ENTRESTO) 49-51 MG Take 1 tablet by mouth 2 (two) times daily. 03/03/22   Nahser, Wonda Cheng, MD  spironolactone (ALDACTONE) 25 MG tablet TAKE 1/2 TABLET BY MOUTH EVERY DAY 07/31/22   Sabharwal, Aditya, DO  vitamin C (ASCORBIC ACID) 500 MG tablet Take 500 mg by mouth every evening.    [provider]      Allergies    Hydrochlorothiazide    Review of Systems   Review of Systems  Unable to perform ROS: Acuity of condition    Physical Exam Updated Vital Signs BP (!) 118/52   Pulse 60   Temp 97.6 F (36.4 C) (Oral)   Resp 16   Ht '5\' 2"'$  (1.575 m)   Wt 46 kg   SpO2 100%   BMI 18.55 kg/m  Physical Exam Vitals and nursing note reviewed.  Constitutional:      Appearance: She is well-developed. She is ill-appearing.     Comments: Listless frail elderly female responds to her name briefly, falls asleep again quickly  HENT:     Head: Normocephalic and atraumatic.  Eyes:     Conjunctiva/sclera: Conjunctivae normal.  Cardiovascular:     Rate and Rhythm: Normal rate and regular rhythm.  Pulmonary:     Effort: Pulmonary effort is normal.     Comments: Rhonchorous Abdominal:     General: There is no distension.  Skin:    General: Skin is warm and dry.  Neurological:     Cranial Nerves: No cranial nerve deficit.     Comments: Listless, age-appropriate atrophy, does move all extremities to stimuli, but falls asleep again quickly.  Psychiatric:     Comments: Withdrawn, essentially nonparticipatory     ED Results / Procedures / Treatments   Labs (all labs ordered are listed, but only abnormal results are displayed) Labs Reviewed  CBC WITH DIFFERENTIAL/PLATELET - Abnormal; Notable for the following components:      Result Value   MCV 104.4 (*)    RDW 16.1 (*)    Platelets 137 (*)    Neutro Abs 8.9 (*)    Abs Immature Granulocytes 0.12 (*)    All other components within normal limits  COMPREHENSIVE METABOLIC PANEL - Abnormal; Notable for the following components:    Sodium 155 (*)    Potassium 2.7 (*)    Chloride 114 (*)    Glucose, Bld 193 (*)    BUN 61 (*)    Creatinine, Ser 2.37 (*)    Calcium >15.0 (*)    Total Protein 5.2 (*)    Albumin 2.7 (*)    AST 51 (*)    GFR, Estimated 19 (*)    All other components within normal limits  CBG MONITORING, ED - Abnormal; Notable for the following components:   Glucose-Capillary 168 (*)    All other  components within normal limits  I-STAT CHEM 8, ED - Abnormal; Notable for the following components:   Sodium 158 (*)    Potassium 2.8 (*)    Chloride 114 (*)    BUN 56 (*)    Creatinine, Ser 2.50 (*)    Glucose, Bld 177 (*)    Calcium, Ion 2.10 (*)    TCO2 33 (*)    All other components within normal limits  RESP PANEL BY RT-PCR (RSV, FLU A&B, COVID)  RVPGX2  URINALYSIS, ROUTINE W REFLEX MICROSCOPIC    EKG EKG Interpretation  Date/Time:  Wednesday August 26 2022 10:50:51 EST Ventricular Rate:  63 PR Interval:  9 QRS Duration: 144 QT Interval:  681 QTC Calculation: 698 R Axis:   -70 Text Interpretation: Atrial-ventricular dual-paced rhythm No further analysis attempted due to paced rhythm Abnormal ECG Confirmed by Carmin Muskrat [id number]) on 09/05/2022 10:55:33 AM  Radiology CT HEAD WO CONTRAST  Result Date: 08/14/2022 CLINICAL DATA:  Mental status change of unknown etiology EXAM: CT HEAD WITHOUT CONTRAST TECHNIQUE: Contiguous axial images were obtained from the base of the skull through the vertex without intravenous contrast. RADIATION DOSE REDUCTION: This exam was performed according to the departmental dose-optimization program which includes automated exposure control, adjustment of the mA and/or kV according to patient size and/or use of iterative reconstruction technique. COMPARISON:  08/17/2022 FINDINGS: Brain: No evidence of acute infarction, hemorrhage, hydrocephalus, extra-axial collection or mass lesion/mass effect. There is mild diffuse low-attenuation within the subcortical and  periventricular white matter compatible with chronic microvascular disease. Prominence of sulci and ventricles compatible with brain atrophy. Vascular: No hyperdense vessel or unexpected calcification. Skull: Normal. Negative for fracture or focal lesion. Sinuses/Orbits: Sinus fluid level noted within the right maxillary sinus. Other: None IMPRESSION: 1. No acute intracranial abnormalities. 2. Chronic microvascular disease and brain atrophy. 3. Right maxillary sinus fluid level. Correlate for any clinical signs or symptoms of acute sinusitis. Electronically Signed   By: Kerby Moors M.D.   On: 08/17/2022 11:28   DG Chest Port 1 View  Result Date: 09/02/2022 CLINICAL DATA:  Altered level of consciousness. EXAM: PORTABLE CHEST 1 VIEW COMPARISON:  08/17/2022 FINDINGS: Previous median sternotomy and CABG procedure. Left chest wall pacer is noted with leads in the right atrial appendage, right ventricle and coronary sinus. Heart size is normal. Aortic atherosclerosis. No pleural effusion or edema. No airspace opacities. IMPRESSION: No acute cardiopulmonary abnormalities. Electronically Signed   By: Kerby Moors M.D.   On: 09/02/2022 10:59    Procedures Procedures    Medications Ordered in ED Medications  sodium chloride 0.9 % bolus 1,000 mL (0 mLs Intravenous Stopped 09/02/2022 1219)    And  0.9 %  sodium chloride infusion ( Intravenous New Bag/Given 09/05/2022 1106)  0.9 % NaCl with KCl 20 mEq/ L  infusion (has no administration in time range)    ED Course/ Medical Decision Making/ A&P This patient with a Hx of CAD, hypertension, advanced age presents to the ED for concern of listlessness, hypoxia, hypotension, this involves an extensive number of treatment options, and is a complaint that carries with it a high risk of complications and morbidity.    The differential diagnosis includes pneumonia, bacteremia, sepsis, dehydration, urinary tract infection   Social Determinants of Health:  Advanced  age  Additional history obtained:  Additional history and/or information obtained from EMS, notable for details above   After the initial evaluation, orders, including: Labs monitoring CT x-ray fluids were initiated.   Patient  placed on Cardiac and Pulse-Oximetry Monitors. The patient was maintained on a cardiac monitor.  The cardiac monitored showed an rhythm of 60 paced abnormal The patient was also maintained on pulse oximetry. The readings were typically 99% with nasal cannula abnormal   On repeat evaluation of the patient stayed the same  Lab Tests:  I personally interpreted labs.  The pertinent results include: Critically abnormal hypercalcemia, and acute kidney injury as well as hypokalemia.  Patient is COVID-negative, flu negative.  Imaging Studies ordered:  I independently visualized and interpreted imaging which showed head CT, chest x-ray both unremarkable I agree with the radiologist interpretation  Dispostion / Final MDM:  After consideration of the diagnostic results and the patient's response to treatment, adult patient presents from nursing facility after being found listless for possibly as many as several days, likely since yesterday.  Patient's evaluation are notable for multiple lab abnormalities, though the patient is in no distress, she is also not particularly interactive.  Patient required some supplemental oxygen but has no evidence for pneumonia, nor COVID/influenza/RSV.  Some suspicion for the patient's mental status changes contributing to this. Given her substantial lab abnormalities she required repletion with potassium IV, fluids, and will require admission for further monitoring, management. Patient is interactivity notable for responding to stimuli, but essentially at baseline is currently withdrawn status.  Patient did not have guarding or rebound that was obvious with her abdominal exam, and abdominal CT not emergently indicated, though may be  considered if she does not improve with appropriate resuscitation. 3:07 PM Patient accompanied at bedside by her nephew, power of attorney.  His in detail of the recent illnesses more depth than that above.  In essence he states that the patient has been progressively weaker since end of last year.  Though she has been driving a car as recently as earlier this month, she has declined substantially over the past few days, particularly after going to her rehabilitation facility.  He is unaware of whether the patient is taking her medication regularly, or eating meals.  He states that over the past 3 days in particular the patient has become essentially listless where as she was oriented x 3 as recently as last month. We discussed goals of care, and given her age, comorbidities, today's substantial abnormalities, after discussing her MOST form, which the patient's POA filled out, goals of care have been adjusted, and the patient will continue medical interventions, but will not be a candidate for pressors, intubation, CPR. Final Clinical Impression(s) / ED Diagnoses Final diagnoses:  Hypokalemia  Hypercalcemia  Acute encephalopathy  CRITICAL CARE Performed by: Carmin Muskrat Total critical care time: 35 minutes Critical care time was exclusive of separately billable procedures and treating other patients. Critical care was necessary to treat or prevent imminent or life-threatening deterioration. Critical care was time spent personally by me on the following activities: development of treatment plan with patient and/or surrogate as well as nursing, discussions with consultants, evaluation of patient's response to treatment, examination of patient, obtaining history from patient or surrogate, ordering and performing treatments and interventions, ordering and review of laboratory studies, ordering and review of radiographic studies, pulse oximetry and re-evaluation of patient's condition.    Carmin Muskrat, MD 08/15/2022 1408    Carmin Muskrat, MD 08/28/2022 [phone]

## 2022-08-26 NOTE — ED Notes (Signed)
Pt coming in from [facility] and [facility]. Pt currently only respond to painful stimuli and calling of name.  Pt barely opens eyes and falls back asleep.  Vital signs stable at this time.

## 2022-08-26 NOTE — ED Notes (Signed)
Per provider. RN can remove cardiac, pulse ox, and BP monitoring. Once pt has started Dilaudid drip nasal canula can be removed

## 2022-08-26 NOTE — ED Triage Notes (Signed)
Pt BIB [facility] from [place] due to altered mental status since Saturday 08/22/22.  Pt does have a pacemaker.  Pt per facility normally Aox4 and walks with walker.  VS BP 120/80, SpO2 74% RA placed on oxygen en route.  18g left arm

## 2022-08-26 NOTE — ED Notes (Signed)
ED TO INPATIENT HANDOFF REPORT  ED Nurse Name and Phone #: Khyler Eschmann [phone]  S Name/Age/Gender Cynthia [name] 89 y.o. female Room/Bed: 022C/022C  Code Status   Code Status: DNR  Home/SNF/Other Skilled nursing facility Pt is currently not speaking Is this baseline? No   Triage Complete: Triage complete  Chief Complaint AKI (acute kidney injury) ([place]) [N17.9] Admission for end of life care [Z51.5]  Triage Note Pt BIB [facility] from [facility] due to altered mental status since Saturday 08/22/22.  Pt does have a pacemaker.  Pt per facility normally Aox4 and walks with walker.  VS BP 120/80, SpO2 74% RA placed on oxygen en route.  18g left arm   Allergies Allergies  Allergen Reactions   Hydrochlorothiazide     Other reaction(s): hyponatremia    Level of Care/Admitting Diagnosis ED Disposition     ED Disposition  Admit   Condition  --   Comment  Hospital Area: [facility] [[id number]]  Level of Care: Palliative Care [15]  May admit patient to [facility] or [facility] if equivalent level of care is available:: Yes  Covid Evaluation: Confirmed COVID Negative  Diagnosis: Admission for end of life care [342876]  Admitting Physician: Karmen Bongo [[id number]]  Attending Physician: Karmen Bongo [[id number]]  Certification:: I certify this patient will need inpatient services for at least 2 midnights          B Medical/Surgery History Past Medical History:  Diagnosis Date   Anxiety    Arthritis    Chronic HFrEF (heart failure with reduced ejection fraction) ([place])    Coronary artery disease    STATUS POST PCI AND LATER CABG   Diabetes mellitus    Dyslipidemia    History of chicken pox    History of echocardiogram    Echo 2022/09/09: mild LVH, EF 55-60, no RWMA, Gr 1 DD, MAC, mild MR, mild LAE, mild TR, PASP 31   Hypertension    Hypomagnesemia    Hyponatremia    Measles    hx of    Mitral regurgitation    Mumps    hx of    Myocardial  infarction (HCC)    NSVT (nonsustained ventricular tachycardia) (HCC)    PAF (paroxysmal atrial fibrillation) (HCC)    Palpitations    PVC's (premature ventricular contractions)    Thrombocytopenia (HCC)    Tricuspid regurgitation    Past Surgical History:  Procedure Laterality Date   BIV PACEMAKER INSERTION CRT-P N/A 07/21/2021   Procedure: BIV PACEMAKER INSERTION CRT-P;  Surgeon: Evans Lance, MD;  Location: [facility];  Service: Cardiovascular;  Laterality: N/A;   CARDIAC CATHETERIZATION  06/06/97   IT REVEALA MILD TO MODERATE LEFT VENTRICULAR SYSTOLIC DYSFUNCTION  WITH EF OF 40%. THERE IS PERSISTENT AKINESIS OF THE INFERIOR WALL. THERE IS MILD REGURGITATION PRESENT. THERE IS MILD HYPOKINESIS OF THE ANTERIOR  AND APEX WALL   CARDIAC CATHETERIZATION N/A 07/27/2016   Procedure: Left Heart Cath and Cors/Grafts Angiography;  Surgeon: Nelva Bush, MD;  Location: [facility];  Service: Cardiovascular;  Laterality: N/A;   CARDIOVERSION N/A 12/17/2021   Procedure: CARDIOVERSION;  Surgeon: Jerline Pain, MD;  Location: [facility];  Service: Cardiovascular;  Laterality: N/A;   CORONARY ARTERY BYPASS GRAFT     TONSILLECTOMY     TOTAL HIP ARTHROPLASTY Right 03/12/2015   Procedure: RIGHT TOTAL HIP ARTHROPLASTY ANTERIOR APPROACH;  Surgeon: Paralee Cancel, MD;  Location: [facility];  Service: Orthopedics;  Laterality:  Right;     A IV Location/Drains/Wounds Patient Lines/Drains/Airways Status     Active Line/Drains/Airways     Name Placement date Placement time Site Days   Peripheral IV 08/25/2022 18 G Anterior;Left Forearm 09/05/2022  1034  Forearm  less than 1   External Urinary Catheter 08/20/22  0948  --  6   Airway 03/12/15  0650  -- 2724            Intake/Output Last 24 hours  Intake/Output Summary (Last 24 hours) at 08/20/2022 1712 Last data filed at 09/08/2022 1621 Gross per 24 hour  Intake 1258.78 ml  Output --  Net 1258.78 ml    Labs/Imaging Results for  orders placed or performed during the hospital encounter of 08/28/2022 (from the past 48 hour(s))  CBG monitoring, ED     Status: Abnormal   Collection Time: 08/12/2022 10:57 AM  Result Value Ref Range   Glucose-Capillary 168 (H) 70 - 99 mg/dL    Comment: Glucose reference range applies only to samples taken after fasting for at least 8 hours.  CBC with Differential/Platelet     Status: Abnormal   Collection Time: 08/13/2022 10:58 AM  Result Value Ref Range   WBC 10.5 4.0 - 10.5 K/uL   RBC 4.06 3.87 - 5.11 MIL/uL   Hemoglobin 13.2 12.0 - 15.0 g/dL   HCT 42.4 36.0 - 46.0 %   MCV 104.4 (H) 80.0 - 100.0 fL   MCH 32.5 26.0 - 34.0 pg   MCHC 31.1 30.0 - 36.0 g/dL   RDW 16.1 (H) 11.5 - 15.5 %   Platelets 137 (L) 150 - 400 K/uL   nRBC 0.0 0.0 - 0.2 %   Neutrophils Relative % 85 %   Neutro Abs 8.9 (H) 1.7 - 7.7 K/uL   Lymphocytes Relative 8 %   Lymphs Abs 0.9 0.7 - 4.0 K/uL   Monocytes Relative 6 %   Monocytes Absolute 0.6 0.1 - 1.0 K/uL   Eosinophils Relative 0 %   Eosinophils Absolute 0.0 0.0 - 0.5 K/uL   Basophils Relative 0 %   Basophils Absolute 0.0 0.0 - 0.1 K/uL   Immature Granulocytes 1 %   Abs Immature Granulocytes 0.12 (H) 0.00 - 0.07 K/uL    Comment: Performed at [facility], [address], [place], [place] [address]  Comprehensive metabolic panel     Status: Abnormal   Collection Time: 09/01/2022 12:00 PM  Result Value Ref Range   Sodium 155 (H) 135 - 145 mmol/L   Potassium 2.7 (LL) 3.5 - 5.1 mmol/L    Comment: CRITICAL RESULT CALLED TO, READ BACK BY AND VERIFIED WITH R. Elon Alas, RN @ 1256 08/13/2022 BY SEKDAHL   Chloride 114 (H) 98 - 111 mmol/L   CO2 31 22 - 32 mmol/L   Glucose, Bld 193 (H) 70 - 99 mg/dL    Comment: Glucose reference range applies only to samples taken after fasting for at least 8 hours.   BUN 61 (H) 8 - 23 mg/dL   Creatinine, Ser 2.37 (H) 0.44 - 1.00 mg/dL   Calcium >15.0 (HH) 8.9 - 10.3 mg/dL    Comment: CRITICAL RESULT CALLED TO, READ BACK BY  AND VERIFIED WITH R. Elon Alas, RN @ 1256 08/13/2022 BY SEKDAHL   Total Protein 5.2 (L) 6.5 - 8.1 g/dL   Albumin 2.7 (L) 3.5 - 5.0 g/dL   AST 51 (H) 15 - 41 U/L   ALT 19 0 - 44 U/L   Alkaline  Phosphatase 84 38 - 126 U/L   Total Bilirubin 0.8 0.3 - 1.2 mg/dL   GFR, Estimated 19 (L) >60 mL/min    Comment: (NOTE) Calculated using the CKD-EPI Creatinine Equation (2021)    Anion gap 10 5 - 15    Comment: Performed at [facility] [address], [place], [place] [address]  Resp panel by RT-PCR (RSV, Flu A&B, Covid) Anterior Nasal Swab     Status: None   Collection Time: 09/07/2022 12:26 PM   Specimen: Anterior Nasal Swab  Result Value Ref Range   SARS Coronavirus 2 by RT PCR NEGATIVE NEGATIVE    Comment: (NOTE) SARS-CoV-2 target nucleic acids are NOT DETECTED.  The SARS-CoV-2 RNA is generally detectable in upper respiratory specimens during the acute phase of infection. The lowest concentration of SARS-CoV-2 viral copies this assay can detect is 138 copies/mL. A negative result does not preclude SARS-Cov-2 infection and should not be used as the sole basis for treatment or other patient management decisions. A negative result may occur with  improper specimen collection/handling, submission of specimen other than nasopharyngeal swab, presence of viral mutation(s) within the areas targeted by this assay, and inadequate number of viral copies(<138 copies/mL). A negative result must be combined with clinical observations, patient history, and epidemiological information. The expected result is Negative.  Fact Sheet for Patients:  EntrepreneurPulse.com.au  Fact Sheet for Healthcare Providers:  IncredibleEmployment.be  This test is no t yet approved or cleared by the [place] FDA and  has been authorized for detection and/or diagnosis of SARS-CoV-2 by FDA under an Emergency Use Authorization (EUA). This EUA will remain  in effect  (meaning this test can be used) for the duration of the COVID-19 declaration under Section 564(b)(1) of the Act, 21 U.S.C.section 360bbb-3(b)(1), unless the authorization is terminated  or revoked sooner.       Influenza A by PCR NEGATIVE NEGATIVE   Influenza B by PCR NEGATIVE NEGATIVE    Comment: (NOTE) The Xpert Xpress SARS-CoV-2/FLU/RSV plus assay is intended as an aid in the diagnosis of influenza from Nasopharyngeal swab specimens and should not be used as a sole basis for treatment. Nasal washings and aspirates are unacceptable for Xpert Xpress SARS-CoV-2/FLU/RSV testing.  Fact Sheet for Patients: EntrepreneurPulse.com.au  Fact Sheet for Healthcare Providers: IncredibleEmployment.be  This test is not yet approved or cleared by the [place] FDA and has been authorized for detection and/or diagnosis of SARS-CoV-2 by FDA under an Emergency Use Authorization (EUA). This EUA will remain in effect (meaning this test can be used) for the duration of the COVID-19 declaration under Section 564(b)(1) of the Act, 21 U.S.C. section 360bbb-3(b)(1), unless the authorization is terminated or revoked.     Resp Syncytial Virus by PCR NEGATIVE NEGATIVE    Comment: (NOTE) Fact Sheet for Patients: EntrepreneurPulse.com.au  Fact Sheet for Healthcare Providers: IncredibleEmployment.be  This test is not yet approved or cleared by the [place] FDA and has been authorized for detection and/or diagnosis of SARS-CoV-2 by FDA under an Emergency Use Authorization (EUA). This EUA will remain in effect (meaning this test can be used) for the duration of the COVID-19 declaration under Section 564(b)(1) of the Act, 21 U.S.C. section 360bbb-3(b)(1), unless the authorization is terminated or revoked.  Performed at [facility], [place] [address], [place], [place] [address]   I-stat chem 8, ED     Status: Abnormal    Collection Time: 08/14/2022  1:16 PM  Result Value Ref Range  Sodium 158 (H) 135 - 145 mmol/L   Potassium 2.8 (L) 3.5 - 5.1 mmol/L   Chloride 114 (H) 98 - 111 mmol/L   BUN 56 (H) 8 - 23 mg/dL   Creatinine, Ser 2.50 (H) 0.44 - 1.00 mg/dL   Glucose, Bld 177 (H) 70 - 99 mg/dL    Comment: Glucose reference range applies only to samples taken after fasting for at least 8 hours.   Calcium, Ion 2.10 (HH) 1.15 - 1.40 mmol/L   TCO2 33 (H) 22 - 32 mmol/L   Hemoglobin 13.9 12.0 - 15.0 g/dL   HCT 41.0 36.0 - 46.0 %   Comment NOTIFIED PHYSICIAN    CT HEAD WO CONTRAST  Result Date: 08/18/2022 CLINICAL DATA:  Mental status change of unknown etiology EXAM: CT HEAD WITHOUT CONTRAST TECHNIQUE: Contiguous axial images were obtained from the base of the skull through the vertex without intravenous contrast. RADIATION DOSE REDUCTION: This exam was performed according to the departmental dose-optimization program which includes automated exposure control, adjustment of the mA and/or kV according to patient size and/or use of iterative reconstruction technique. COMPARISON:  08/17/2022 FINDINGS: Brain: No evidence of acute infarction, hemorrhage, hydrocephalus, extra-axial collection or mass lesion/mass effect. There is mild diffuse low-attenuation within the subcortical and periventricular white matter compatible with chronic microvascular disease. Prominence of sulci and ventricles compatible with brain atrophy. Vascular: No hyperdense vessel or unexpected calcification. Skull: Normal. Negative for fracture or focal lesion. Sinuses/Orbits: Sinus fluid level noted within the right maxillary sinus. Other: None IMPRESSION: 1. No acute intracranial abnormalities. 2. Chronic microvascular disease and brain atrophy. 3. Right maxillary sinus fluid level. Correlate for any clinical signs or symptoms of acute sinusitis. Electronically Signed   By: Kerby Moors M.D.   On: 08/25/2022 11:28   DG Chest Port 1 View  Result  Date: 08/19/2022 CLINICAL DATA:  Altered level of consciousness. EXAM: PORTABLE CHEST 1 VIEW COMPARISON:  08/17/2022 FINDINGS: Previous median sternotomy and CABG procedure. Left chest wall pacer is noted with leads in the right atrial appendage, right ventricle and coronary sinus. Heart size is normal. Aortic atherosclerosis. No pleural effusion or edema. No airspace opacities. IMPRESSION: No acute cardiopulmonary abnormalities. Electronically Signed   By: Kerby Moors M.D.   On: 09/07/2022 10:59    Pending Labs Unresulted Labs (From admission, onward)    None       Vitals/Pain Today's Vitals   08/18/2022 1330 08/24/2022 1445 08/15/2022 1505 08/29/2022 1545  BP: (!) 118/52 (!) 111/48  (!) 131/50  Pulse: 60 60  (!) 59  Resp: _0 Temp:   97.9 F (36.6 C)   TempSrc:   Oral   SpO2: 100% 100%  100%  Weight:      Height:        Isolation Precautions No active isolations  Medications Medications  acetaminophen (TYLENOL) tablet 650 mg (has no administration in time range)    Or  acetaminophen (TYLENOL) suppository 650 mg (has no administration in time range)  diphenhydrAMINE (BENADRYL) injection 12.5 mg (has no administration in time range)  ondansetron (ZOFRAN-ODT) disintegrating tablet 4 mg (has no administration in time range)    Or  ondansetron (ZOFRAN) injection 4 mg (has no administration in time range)  glycopyrrolate (ROBINUL) tablet 1 mg (has no administration in time range)    Or  glycopyrrolate (ROBINUL) injection 0.2 mg (has no administration in time range)    Or  glycopyrrolate (ROBINUL) injection 0.2 mg (has no administration in time  range)  polyvinyl alcohol (LIQUIFILM TEARS) 1.4 % ophthalmic solution 1 drop (has no administration in time range)  HYDROmorphone (DILAUDID) 50 mg in 50 mL NS (54m/mL) premix infusion (has no administration in time range)  HYDROmorphone (DILAUDID) bolus via infusion 0.5-1 mg (has no administration in time range)  antiseptic oral rinse  (BIOTENE) solution 15 mL (has no administration in time range)  LORazepam (ATIVAN) tablet 1 mg (has no administration in time range)    Or  LORazepam (ATIVAN) 2 MG/ML concentrated solution 1 mg (has no administration in time range)    Or  LORazepam (ATIVAN) injection 1 mg (has no administration in time range)  haloperidol (HALDOL) tablet 2 mg (has no administration in time range)    Or  haloperidol (HALDOL) 2 MG/ML solution 2 mg (has no administration in time range)    Or  haloperidol lactate (HALDOL) injection 2 mg (has no administration in time range)  sodium chloride 0.9 % bolus 1,000 mL (0 mLs Intravenous Stopped 09/07/2022 1219)  0.9 % NaCl with KCl 20 mEq/ L  infusion (0 mL/hr Intravenous Stopped 09/06/2022 1621)    Mobility non-ambulatory     Focused Assessments Neuro Assessment Handoff:  Swallow screen pass? No          Neuro Assessment: Exceptions to [facility] Neuro Checks:      Has TPA been given? No If patient is a Neuro Trauma and patient is going to OR before floor call report to [facility]: [phone] [phone]  R Recommendations: See Admitting Provider Note  Report given to:   Additional Notes:  Nephew is POA

## 2022-08-26 NOTE — H&P (Addendum)
History and Physical    Patient: Cynthia [name] JME:[id number] DOB: [birth year] DOA: 08/12/2022 DOS: the patient was seen and examined on 08/30/2022 PCP: Reynold Bowen, MD  Patient coming from: SNF - [facility]; NOK: Senaida Lange [place], [place]   Chief Complaint: AMS  HPI: Cynthia [name] is a 89 y.o. female with medical history significant of chronic systolic CHF, CAD, DM, HLD, HTN, and afib presenting with AMS.  She was discharged from here last Thursday and went to [place].  Has lived alone 7-8 years and has done well.  Last year, she needed a pacemaker (07/2021).  Then she went into afib, got cardioverted and has struggled since.  She was ok until Thanksgiving and then got a bad cold and has regressed since.  She last drove maybe 3 weeks ago.  Her memory has been an issue maybe 6 months.  In the last 20 days, she has really deteriorated.  This last week, her nephew has visited her daily.  She fell about 10 days ago, found down the next day.  She saw cards this week and was sent to [facility].  She was admitted for about 3 days, went to [place] [facility].  She was admitted from 1/8-11 with DKA.  She has worsened daily, started on IVF yesterday.  Glucose has been high.  She is not eating/drinking.  He thinks she would prefer to be comfortable.    ER Course:  Withdrawn status, usually A&O x 3.  Very dehydrated, AKI, electrolyte abnormalities.  Giving IVF and K+.       Review of Systems: unable to review all systems due to the inability of the patient to answer questions. Past Medical History:  Diagnosis Date   Anxiety    Arthritis    Chronic HFrEF (heart failure with reduced ejection fraction) (HCC)    Coronary artery disease    STATUS POST PCI AND LATER CABG   Diabetes mellitus    Dyslipidemia    History of chicken pox    History of echocardiogram    Echo 09/17/2022: mild LVH, EF 55-60, no RWMA, Gr 1 DD, MAC, mild MR, mild LAE, mild TR, PASP 31   Hypertension    Hypomagnesemia     Hyponatremia    Measles    hx of    Mitral regurgitation    Mumps    hx of    Myocardial infarction (HCC)    NSVT (nonsustained ventricular tachycardia) (HCC)    PAF (paroxysmal atrial fibrillation) (HCC)    Palpitations    PVC's (premature ventricular contractions)    Thrombocytopenia (HCC)    Tricuspid regurgitation    Past Surgical History:  Procedure Laterality Date   BIV PACEMAKER INSERTION CRT-P N/A 07/21/2021   Procedure: BIV PACEMAKER INSERTION CRT-P;  Surgeon: Evans Lance, MD;  Location: [place] [facility];  Service: Cardiovascular;  Laterality: N/A;   CARDIAC CATHETERIZATION  06/06/97   IT REVEALA MILD TO MODERATE LEFT VENTRICULAR SYSTOLIC DYSFUNCTION  WITH EF OF 40%. THERE IS PERSISTENT AKINESIS OF THE INFERIOR WALL. THERE IS MILD REGURGITATION PRESENT. THERE IS MILD HYPOKINESIS OF THE ANTERIOR  AND APEX WALL   CARDIAC CATHETERIZATION N/A 07/27/2016   Procedure: Left Heart Cath and Cors/Grafts Angiography;  Surgeon: Nelva Bush, MD;  Location: [place] [facility];  Service: Cardiovascular;  Laterality: N/A;   CARDIOVERSION N/A 12/17/2021   Procedure: CARDIOVERSION;  Surgeon: Jerline Pain, MD;  Location: [facility];  Service: Cardiovascular;  Laterality: N/A;   CORONARY ARTERY  BYPASS GRAFT     TONSILLECTOMY     TOTAL HIP ARTHROPLASTY Right 03/12/2015   Procedure: RIGHT TOTAL HIP ARTHROPLASTY ANTERIOR APPROACH;  Surgeon: Paralee Cancel, MD;  Location: [facility];  Service: Orthopedics;  Laterality: Right;   Social History:  reports that she has never smoked. She has never used smokeless tobacco. She reports that she does not drink alcohol and does not use drugs.  Allergies  Allergen Reactions   Hydrochlorothiazide     Other reaction(s): hyponatremia    Family History  Problem Relation Age of Onset   Heart attack Mother    Stroke Father    Cancer Father     Prior to Admission medications   Medication Sig Start Date End Date Taking? Authorizing Provider   amiodarone (PACERONE) 200 MG tablet Take 200 mg by mouth daily. No medication on Sunday    [provider]  amoxicillin (AMOXIL) 500 MG capsule TAKE 4 CAPSULES BY MOUTH 1 HOUR PRIOR TO PROCEDURE Patient not taking: Reported on 08/20/2022 04/24/22   [provider]  apixaban (ELIQUIS) 2.5 MG TABS tablet Take 1 tablet (2.5 mg total) by mouth 2 (two) times daily. 10/23/21   Fenton, Clint R, PA  atorvastatin (LIPITOR) 40 MG tablet TAKE 1 TABLET DAILY Patient taking differently: Take 40 mg by mouth daily. 04/01/22   Nahser, Wonda Cheng, MD  Calcium Carb-Cholecalciferol (CALCIUM 600 + D PO) Take 1 tablet by mouth 2 (two) times daily.    [provider]  Coenzyme Q10 (COQ10) 200 MG CAPS Take 200 mg by mouth every evening.    [provider]  cyanocobalamin (VITAMIN B12) 1000 MCG tablet Take 1 tablet (1,000 mcg total) by mouth daily. 08/28/22   Ghimire, Henreitta Leber, MD  cyanocobalamin (VITAMIN B12) 1000 MCG/ML injection Inject 1 mL (1,000 mcg total) into the skin daily for 7 days. 08/20/22 Aug 28, 2022  Ghimire, Henreitta Leber, MD  dapagliflozin propanediol (FARXIGA) 10 MG TABS tablet Take 1 tablet (10 mg total) by mouth daily before breakfast. 05/05/22   Sabharwal, Aditya, DO  ezetimibe (ZETIA) 10 MG tablet TAKE 1 TABLET DAILY Patient taking differently: Take 10 mg by mouth daily. 05/07/22   Nahser, Wonda Cheng, MD  feeding supplement (ENSURE ENLIVE / ENSURE PLUS) LIQD Take 237 mLs by mouth 2 (two) times daily between meals. 08/20/22   Ghimire, Henreitta Leber, MD  furosemide (LASIX) 20 MG tablet TAKE 1 TABLET BY MOUTH EVERY DAY 06/01/22   Nahser, Wonda Cheng, MD  insulin aspart (NOVOLOG) 100 UNIT/ML injection 0-15 Units, Subcutaneous, 3 times daily with meals CBG < 70: Implement Hypoglycemia measures CBG 70 - 120: 0 units CBG 121 - 150: 2 units CBG 151 - 200: 3 units CBG 201 - 250: 5 units CBG 251 - 300: 8 units CBG 301 - 350: 11 units CBG 351 - 400: 15 units CBG > 400: call MD 08/20/22   Jonetta Osgood, MD  insulin degludec (TRESIBA FLEXTOUCH) 100 UNIT/ML FlexTouch Pen Inject 15 Units into the skin daily. 08/20/22   Ghimire, Henreitta Leber, MD  isosorbide mononitrate (IMDUR) 60 MG 24 hr tablet TAKE 1 TABLET BY MOUTH EVERY DAY Patient taking differently: Take 60 mg by mouth daily. 06/29/22   Nahser, Wonda Cheng, MD  Magnesium 400 MG CAPS Take 400 mg by mouth daily in the afternoon.    [provider]  metoprolol succinate (TOPROL XL) 25 MG 24 hr tablet Take 1 tablet (25 mg total) by mouth daily. 12/05/21   Evans Lance,  MD  Multiple Vitamin (MULTIVITAMIN) tablet Take 1 tablet by mouth every morning.    [provider]  nitroGLYCERIN (NITROSTAT) 0.4 MG SL tablet Place 1 tablet (0.4 mg total) under the tongue every 5 (five) minutes as needed for chest pain. 01/07/22 05/06/23  Baldwin Jamaica, PA-C  ondansetron (ZOFRAN) 4 MG tablet Take 4 mg by mouth 2 (two) times daily. 08/11/22   [provider]  Polyethyl Glycol-Propyl Glycol (SYSTANE OP) Apply 1 drop to eye 2 (two) times daily as needed (dry/scratchy eyes).    [provider]  sacubitril-valsartan (ENTRESTO) 49-51 MG Take 1 tablet by mouth 2 (two) times daily. 03/03/22   Nahser, Wonda Cheng, MD  spironolactone (ALDACTONE) 25 MG tablet TAKE 1/2 TABLET BY MOUTH EVERY DAY 07/31/22   Sabharwal, Aditya, DO  vitamin C (ASCORBIC ACID) 500 MG tablet Take 500 mg by mouth every evening.    [provider]    Physical Exam: Vitals:   09/02/2022 1300 08/23/2022 1330 09/05/2022 1445 08/16/2022 1505  BP: (!) 114/49 (!) 118/52 (!) 111/48   Pulse: (!) 58 60 60   Resp: _0 Temp:    97.9 F (36.6 C)  TempSrc:    Oral  SpO2: 100% 100% 100%   Weight:      Height:       General:  Appears somnolent, minimally responsive Eyes:   normal lids, iris, minimally open ENT:  grossly normal  lips & tongue, mmm Neck:  no LAD, masses or thyromegaly Cardiovascular:  RRR, no m/r/g. No LE edema.  Respiratory:   CTA bilaterally with no  wheezes/rales/rhonchi.  Normal respiratory effort. Abdomen:  soft, NT, ND Skin:  no rash or induration seen on limited exam Musculoskeletal:   no bony abnormality Lower extremity:  No LE edema.  Limited foot exam with no ulcerations.  2+ distal pulses. Psychiatric:  obtunded, minimally responsive Neurologic:  unable to effectively perform   Radiological Exams on Admission: Independently reviewed - see discussion in A/P where applicable  CT HEAD WO CONTRAST  Result Date: 08/29/2022 CLINICAL DATA:  Mental status change of unknown etiology EXAM: CT HEAD WITHOUT CONTRAST TECHNIQUE: Contiguous axial images were obtained from the base of the skull through the vertex without intravenous contrast. RADIATION DOSE REDUCTION: This exam was performed according to the departmental dose-optimization program which includes automated exposure control, adjustment of the mA and/or kV according to patient size and/or use of iterative reconstruction technique. COMPARISON:  08/17/2022 FINDINGS: Brain: No evidence of acute infarction, hemorrhage, hydrocephalus, extra-axial collection or mass lesion/mass effect. There is mild diffuse low-attenuation within the subcortical and periventricular white matter compatible with chronic microvascular disease. Prominence of sulci and ventricles compatible with brain atrophy. Vascular: No hyperdense vessel or unexpected calcification. Skull: Normal. Negative for fracture or focal lesion. Sinuses/Orbits: Sinus fluid level noted within the right maxillary sinus. Other: None IMPRESSION: 1. No acute intracranial abnormalities. 2. Chronic microvascular disease and brain atrophy. 3. Right maxillary sinus fluid level. Correlate for any clinical signs or symptoms of acute sinusitis. Electronically Signed   By: Kerby Moors M.D.   On: 08/13/2022 11:28   DG Chest Port 1 View  Result Date: 08/14/2022 CLINICAL DATA:  Altered level of consciousness. EXAM: PORTABLE CHEST 1 VIEW COMPARISON:   08/17/2022 FINDINGS: Previous median sternotomy and CABG procedure. Left chest wall pacer is noted with leads in the right atrial appendage, right ventricle and coronary sinus. Heart size is normal. Aortic atherosclerosis. No pleural effusion or edema. No  airspace opacities. IMPRESSION: No acute cardiopulmonary abnormalities. Electronically Signed   By: Kerby Moors M.D.   On: 08/29/2022 10:59    EKG: Independently reviewed.  AV paced with rate 63; prolonged QTc 698; nonspecific ST changes   Labs on Admission: I have personally reviewed the available labs and imaging studies at the time of the admission.  Pertinent labs:    Na++ 155 K+ 2.7 Glucose 193 BUN 61/Creatinine 2.37/GFR 19; normal on 1/9 Calcium >15 Ionized calcium 2.10 Albumin 2.7 Unremarkable CBC COVID/flu/RSV negative   Assessment and Plan: Principal Problem:   Admission for end of life care Active Problems:   Acute renal failure (ARF) (HCC)   Failure to thrive in adult   Hypercalcemia    Admission for end of life care -Patient presenting with acute renal failure, failure to thrive, marked electrolyte abnormalities -After long discussion in the ER, family has decided to proceed with comfort care only -She will be admitted to [facility] for comfort care and palliative care consult -Patient is unlikely to be a candidate for [employer] [facility] or other residential hospice, as she does appears to be actively dying and her reserve is likely quite low given her age and overall frailty -Comfort care order set utilized -Pain control with Dilaudid drip      Advance Care Planning:   Code Status: DNR   Consults: Palliative care  DVT Prophylaxis: None - comfort measures  Family Communication: Nephew was present throughout evaluation  Severity of Illness: The appropriate patient status for this patient is INPATIENT. Inpatient status is judged to be reasonable and necessary in order to provide the required intensity of service to  ensure the patient's safety. The patient's presenting symptoms, physical exam findings, and initial radiographic and laboratory data in the context of their chronic comorbidities is felt to place them at high risk for further clinical deterioration. Furthermore, it is not anticipated that the patient will be medically stable for discharge from the hospital within 2 midnights of admission.   * I certify that at the point of admission it is my clinical judgment that the patient will require inpatient hospital care spanning beyond 2 midnights from the point of admission due to high intensity of service, high risk for further deterioration and high frequency of surveillance required.*  Author: Karmen Bongo, MD 08/13/2022 4:00 PM  For on call review [email or web address].

## 2022-08-26 NOTE — Consult Note (Signed)
Consultation Note Date: 09/01/2022   Patient Name: Cynthia [name]  DOB: 22-May-1932  MRN: [id number]  Age / Sex: 89 y.o., female  PCP: Reynold Bowen, MD Referring Physician: Karmen Bongo, MD  Reason for Consultation: Non pain symptom management, Pain control, Psychosocial/spiritual support, and Terminal Care  HPI/Patient Profile: 89 y.o. female  with past medical history of chronic systolic CHF, CAD, DM, HLD, HTN, and afib presented to ED on 08/11/2022 from [facility] [place]. After discussions with EDP and admitting provider, family have opted for full comfort measures. Patient was admitted on 09/03/2022 for end of life care.   Clinical Assessment and Goals of Care: I have reviewed medical records including EPIC notes, labs, and imaging. Received report from primary RN - no acute concerns.   Went to visit patient at bedside - no family/visitors present. Patient was lying in bed asleep - she does not wake to voice/gentle touch. No signs or non-verbal gestures of pain or discomfort noted. No respiratory distress or secretions noted; slight increase work of breathing. She is on 3L O2 [place].  Discussed symptom management plan with RN. Dilaudid drip previously ordered. After drip has been initiated, will remove O2 to room air.   Primary Decision Maker: HCPOA - per documentation, is nephew/Joel Lankford    SUMMARY OF RECOMMENDATIONS   Continue full comfort measures Continue DNR/DNI as previously documented Will assess stability for hospice transfer tomorrow 09/13/22 - patient may be hospital death Added orders for EOL symptom management and to reflect full comfort measures, as well as discontinued orders that were not focused on comfort Unrestricted visitation orders were placed per current [place] E[facility] visitation policy  Nursing to provide frequent assessments and administer PRN medications as clinically  necessary to ensure EOL comfort PMT will continue to follow and support holistically  Symptom Management Continuous dilaudid infusion; PRN bolus doses for breakthrough symptoms  Tylenol PRN pain/fever Biotin twice daily Benadryl PRN itching Robinul PRN secretions Haldol PRN agitation/delirium Ativan PRN anxiety/seizure/sleep/distress Zofran PRN nausea/vomiting Liquifilm Tears PRN dry eye  Code Status/Advance Care Planning: DNR  Palliative Prophylaxis:  Aspiration, Delirium Protocol, Frequent Pain Assessment, Oral Care, and Turn Reposition  Additional Recommendations (Limitations, Scope, Preferences): Full Comfort Care  Psycho-social/Spiritual:  Desire for further Chaplaincy support:no Created space and opportunity for patient to express thoughts and feelings regarding patient's current medical situation.  Emotional support and therapeutic listening provided.  Prognosis:  Hours - Days  Discharge Planning: To Be Determined      Primary Diagnoses: Present on Admission:  Acute renal failure (ARF) ([place])  Failure to thrive in adult  Hypercalcemia   I have reviewed the medical record, interviewed the patient and family, and examined the patient. The following aspects are pertinent.  Past Medical History:  Diagnosis Date   Anxiety    Arthritis    Chronic HFrEF (heart failure with reduced ejection fraction) (HCC)    Coronary artery disease    STATUS POST PCI AND LATER CABG   Diabetes mellitus    Dyslipidemia    History of chicken pox    History of echocardiogram    Echo 2022-09-13: mild LVH, EF 55-60, no RWMA, Gr 1 DD, MAC, mild MR, mild LAE, mild TR, PASP 31   Hypertension    Hypomagnesemia    Hyponatremia    Measles    hx of    Mitral regurgitation    Mumps    hx of    Myocardial infarction [facility])    NSVT (nonsustained  ventricular tachycardia) (HCC)    PAF (paroxysmal atrial fibrillation) (HCC)    Palpitations    PVC's (premature ventricular contractions)     Thrombocytopenia (HCC)    Tricuspid regurgitation    Social History   Socioeconomic History   Marital status: Widowed    Spouse name: Not on file   Number of children: Not on file   Years of education: Not on file   Highest education level: Not on file  Occupational History   Not on file  Tobacco Use   Smoking status: Never   Smokeless tobacco: Never   Tobacco comments:    Never smoke 10/23/21  Vaping Use   Vaping Use: Never used  Substance and Sexual Activity   Alcohol use: No   Drug use: No   Sexual activity: Not on file  Other Topics Concern   Not on file  Social History Narrative   Not on file   Social Determinants of Health   Financial Resource Strain: Not on file  Food Insecurity: No Food Insecurity (08/17/2022)   Hunger Vital Sign    Worried About Running Out of Food in the Last Year: Never true    Ran Out of Food in the Last Year: Never true  Transportation Needs: No Transportation Needs (08/17/2022)   PRAPARE - Hydrologist (Medical): No    Lack of Transportation (Non-Medical): No  Physical Activity: Not on file  Stress: Not on file  Social Connections: Not on file   Family History  Problem Relation Age of Onset   Heart attack Mother    Stroke Father    Cancer Father    Scheduled Meds: Continuous Infusions:  0.9 % NaCl with KCl 20 mEq / L 500 mL/hr at 08/14/2022 1506   HYDROmorphone     PRN Meds:.acetaminophen **OR** acetaminophen, antiseptic oral rinse, diphenhydrAMINE, glycopyrrolate **OR** glycopyrrolate **OR** glycopyrrolate, haloperidol **OR** haloperidol **OR** haloperidol lactate, HYDROmorphone, LORazepam **OR** LORazepam **OR** LORazepam, ondansetron **OR** ondansetron (ZOFRAN) IV, polyvinyl alcohol Medications Prior to Admission:  Prior to Admission medications   Medication Sig Start Date End Date Taking? Authorizing Provider  amiodarone (PACERONE) 200 MG tablet Take 200 mg by mouth daily. No medication on Sunday     [provider]  apixaban (ELIQUIS) 2.5 MG TABS tablet Take 1 tablet (2.5 mg total) by mouth 2 (two) times daily. 10/23/21   Fenton, Clint R, PA  atorvastatin (LIPITOR) 40 MG tablet TAKE 1 TABLET DAILY Patient taking differently: Take 40 mg by mouth daily. 04/01/22   Nahser, Wonda Cheng, MD  Calcium Carb-Cholecalciferol (CALCIUM 600 + D PO) Take 1 tablet by mouth 2 (two) times daily.    [provider]  Coenzyme Q10 (COQ10) 200 MG CAPS Take 200 mg by mouth every evening.    [provider]  cyanocobalamin (VITAMIN B12) 1000 MCG tablet Take 1 tablet (1,000 mcg total) by mouth daily. 08/28/22   Ghimire, Henreitta Leber, MD  cyanocobalamin (VITAMIN B12) 1000 MCG/ML injection Inject 1 mL (1,000 mcg total) into the skin daily for 7 days. 08/20/22 2022-09-17  Ghimire, Henreitta Leber, MD  dapagliflozin propanediol (FARXIGA) 10 MG TABS tablet Take 1 tablet (10 mg total) by mouth daily before breakfast. 05/05/22   Sabharwal, Aditya, DO  ezetimibe (ZETIA) 10 MG tablet TAKE 1 TABLET DAILY Patient taking differently: Take 10 mg by mouth daily. 05/07/22   Nahser, Wonda Cheng, MD  feeding supplement (ENSURE ENLIVE / ENSURE PLUS) LIQD Take 237 mLs by mouth 2 (two) times  daily between meals. 08/20/22   Ghimire, Henreitta Leber, MD  furosemide (LASIX) 20 MG tablet TAKE 1 TABLET BY MOUTH EVERY DAY 06/01/22   Nahser, Wonda Cheng, MD  insulin aspart (NOVOLOG) 100 UNIT/ML injection 0-15 Units, Subcutaneous, 3 times daily with meals CBG < 70: Implement Hypoglycemia measures CBG 70 - 120: 0 units CBG 121 - 150: 2 units CBG 151 - 200: 3 units CBG 201 - 250: 5 units CBG 251 - 300: 8 units CBG 301 - 350: 11 units CBG 351 - 400: 15 units CBG > 400: call MD 08/20/22   Jonetta Osgood, MD  insulin degludec (TRESIBA FLEXTOUCH) 100 UNIT/ML FlexTouch Pen Inject 15 Units into the skin daily. 08/20/22   Ghimire, Henreitta Leber, MD  isosorbide mononitrate (IMDUR) 60 MG 24 hr tablet TAKE 1 TABLET BY MOUTH EVERY DAY Patient taking differently: Take  60 mg by mouth daily. 06/29/22   Nahser, Wonda Cheng, MD  Magnesium 400 MG CAPS Take 400 mg by mouth daily in the afternoon.    [provider]  metoprolol succinate (TOPROL XL) 25 MG 24 hr tablet Take 1 tablet (25 mg total) by mouth daily. 12/05/21   Evans Lance, MD  Multiple Vitamin (MULTIVITAMIN) tablet Take 1 tablet by mouth every morning.    [provider]  nitroGLYCERIN (NITROSTAT) 0.4 MG SL tablet Place 1 tablet (0.4 mg total) under the tongue every 5 (five) minutes as needed for chest pain. 01/07/22 05/06/23  Baldwin Jamaica, PA-C  ondansetron (ZOFRAN) 4 MG tablet Take 4 mg by mouth 2 (two) times daily. 08/11/22   [provider]  Polyethyl Glycol-Propyl Glycol (SYSTANE OP) Apply 1 drop to eye 2 (two) times daily as needed (dry/scratchy eyes).    [provider]  sacubitril-valsartan (ENTRESTO) 49-51 MG Take 1 tablet by mouth 2 (two) times daily. 03/03/22   Nahser, Wonda Cheng, MD  spironolactone (ALDACTONE) 25 MG tablet TAKE 1/2 TABLET BY MOUTH EVERY DAY 07/31/22   Sabharwal, Aditya, DO  vitamin C (ASCORBIC ACID) 500 MG tablet Take 500 mg by mouth every evening.    [provider]   Allergies  Allergen Reactions   Hydrochlorothiazide     Other reaction(s): hyponatremia   Review of Systems  Unable to perform ROS: Acuity of condition    Physical Exam Vitals and nursing note reviewed.  Constitutional:      General: She is not in acute distress.    Appearance: She is ill-appearing.  Pulmonary:     Effort: No respiratory distress.  Skin:    General: Skin is warm and dry.  Neurological:     Mental Status: She is unresponsive.  Psychiatric:        Speech: She is noncommunicative.     Vital Signs: BP (!) 111/48   Pulse 60   Temp 97.9 F (36.6 C) (Oral)   Resp 15   Ht _0  (1.575 m)   Wt 46 kg   SpO2 100%   BMI 18.55 kg/m  Pain Scale: Faces       SpO2: SpO2: 100 % O2 Device:SpO2: 100 % O2 Flow Rate: .O2 Flow Rate (L/min): 2  L/min  IO: Intake/output summary: No intake or output data in the 24 hours ending 09/06/2022 1611  LBM:   Baseline Weight: Weight: 46 kg Most recent weight: Weight: 46 kg     Palliative Assessment/Data: PPS 10%     Signed by: Lin Landsman, NP   Please contact Palliative Medicine Team phone  at [phone] for questions and concerns.  For individual provider: See Amion  *Portions of this note are a verbal dictation therefore any spelling and/or grammatical errors are due to the "[employer]" system interpretation.

## 2022-08-27 ENCOUNTER — Other Ambulatory Visit: Payer: Self-pay

## 2022-08-27 DIAGNOSIS — Z515 Encounter for palliative care: Secondary | ICD-10-CM | POA: Diagnosis not present

## 2022-09-01 ENCOUNTER — Ambulatory Visit: Payer: Medicare Other | Admitting: Cardiovascular Disease

## 2022-09-07 ENCOUNTER — Encounter (HOSPITAL_COMMUNITY): Payer: Medicare Other

## 2022-09-09 ENCOUNTER — Ambulatory Visit: Payer: Medicare Other | Admitting: Cardiovascular Disease

## 2022-09-10 NOTE — Progress Notes (Signed)
Pronounced Time of death 0030 2022/09/08, By Velna Hatchet RN and Tacy Dura RN

## 2022-09-10 NOTE — Death Summary Note (Signed)
DEATH SUMMARY   Patient Details  Name: Cynthia [name] MRN: [id number] DOB: 04/24/1932 WUJ:[id number], Annie Main, MD Admission/Discharge Information   Admit Date:  08-31-22  Date of Death: Date of Death: 09/01/22  Time of Death: Time of Death: 0030  Length of Stay: 1   Principle Cause of death: Acute renal failure  Hospital Diagnoses: Principal Problem:   Admission for end of life care Active Problems:   Acute renal failure (ARF) ([place])   Failure to thrive in adult   Hypercalcemia   Hospital Course: Died peacefully overnight on the night following admission.  Assessment and Plan: No notes have been filed under this hospital service. Service: Hospitalist   Admission for end of life care -Patient presenting with acute renal failure, failure to thrive, marked electrolyte abnormalities -After long discussion in the ER, family has decided to proceed with comfort care only -She was admitted to [facility] for comfort care and palliative care consult -Patient was actively dying at the time of admission with low reserve given her age and overall frailty -Comfort care order set utilized -Pain control with Dilaudid drip -Died peacefully within 12 hours of admission     Procedures: None  Consultations: Palliative care  The results of significant diagnostics from this hospitalization (including imaging, microbiology, ancillary and laboratory) are listed below for reference.   Significant Diagnostic Studies: CT HEAD WO CONTRAST  Result Date: 2022/08/31 CLINICAL DATA:  Mental status change of unknown etiology EXAM: CT HEAD WITHOUT CONTRAST TECHNIQUE: Contiguous axial images were obtained from the base of the skull through the vertex without intravenous contrast. RADIATION DOSE REDUCTION: This exam was performed according to the departmental dose-optimization program which includes automated exposure control, adjustment of the mA and/or kV according to patient size and/or use of iterative  reconstruction technique. COMPARISON:  08/17/2022 FINDINGS: Brain: No evidence of acute infarction, hemorrhage, hydrocephalus, extra-axial collection or mass lesion/mass effect. There is mild diffuse low-attenuation within the subcortical and periventricular white matter compatible with chronic microvascular disease. Prominence of sulci and ventricles compatible with brain atrophy. Vascular: No hyperdense vessel or unexpected calcification. Skull: Normal. Negative for fracture or focal lesion. Sinuses/Orbits: Sinus fluid level noted within the right maxillary sinus. Other: None IMPRESSION: 1. No acute intracranial abnormalities. 2. Chronic microvascular disease and brain atrophy. 3. Right maxillary sinus fluid level. Correlate for any clinical signs or symptoms of acute sinusitis. Electronically Signed   By: Kerby Moors M.D.   On: August 31, 2022 11:28   DG Chest Port 1 View  Result Date: 08-31-2022 CLINICAL DATA:  Altered level of consciousness. EXAM: PORTABLE CHEST 1 VIEW COMPARISON:  08/17/2022 FINDINGS: Previous median sternotomy and CABG procedure. Left chest wall pacer is noted with leads in the right atrial appendage, right ventricle and coronary sinus. Heart size is normal. Aortic atherosclerosis. No pleural effusion or edema. No airspace opacities. IMPRESSION: No acute cardiopulmonary abnormalities. Electronically Signed   By: Kerby Moors M.D.   On: August 31, 2022 10:59   CT CHEST WO CONTRAST  Result Date: 08/18/2022 CLINICAL DATA:  Follow-up abnormal chest x-ray EXAM: CT CHEST WITHOUT CONTRAST TECHNIQUE: Multidetector CT imaging of the chest was performed following the standard protocol without IV contrast. RADIATION DOSE REDUCTION: This exam was performed according to the departmental dose-optimization program which includes automated exposure control, adjustment of the mA and/or kV according to patient size and/or use of iterative reconstruction technique. COMPARISON:  Chest x-ray dated August 17, 2022 FINDINGS: Cardiovascular: Normal heart size. No pericardial effusion. Mitral annular calcifications. Severe left  main and three-vessel coronary artery calcifications status post CABG. Normal caliber thoracic aorta with severe calcified plaque. Mediastinum/Nodes: Small hiatal hernia. Thyroid is unremarkable. No pathologically enlarged lymph nodes seen in the chest. Lungs/Pleura: Central airways are patent. Trace bilateral pleural effusions and bibasilar atelectasis. Bilateral solid pulmonary nodules. Largest is a solid pulmonary nodule left lower lobe measuring 6 mm on series 4, image 107. Nodular opacity of the right lower lung described on prior chest x-ray favored to be due to overlapping osseous and vascular shadows. Upper Abdomen: Moderate thickening of the partially visualized left adrenal gland. Musculoskeletal: No chest wall mass or suspicious bone lesions identified. IMPRESSION: 1. Nodular opacity of the right lower lung described on prior chest x-ray favored to be due to overlapping osseous and vascular shadows. 2. Bilateral solid pulmonary nodules, largest is a solid pulmonary nodule of the left lower lobe measuring 6 mm. Non-contrast chest CT at 3-6 months is recommended. If the nodules are stable at time of repeat CT, then future CT at 18-24 months (from today's scan) is considered optional for low-risk patients, but is recommended for high-risk patients. This recommendation follows the consensus statement: Guidelines for Management of Incidental Pulmonary Nodules Detected on CT Images: From the Fleischner Society 2017; Radiology 2017; 284:228-243. 3. Moderate thickening of the partially visualized left. Consider dedicated adrenal protocol CT exclude underlying adrenal nodules. This can be performed non emergently. 4. Aortic Atherosclerosis (ICD10-I70.0). Electronically Signed   By: Yetta Glassman M.D.   On: 08/18/2022 09:00   CT Head Wo Contrast  Result Date: 08/17/2022 CLINICAL DATA:  Head  and neck trauma. EXAM: CT HEAD WITHOUT CONTRAST CT CERVICAL SPINE WITHOUT CONTRAST TECHNIQUE: Multidetector CT imaging of the head and cervical spine was performed following the standard protocol without intravenous contrast. Multiplanar CT image reconstructions of the cervical spine were also generated. RADIATION DOSE REDUCTION: This exam was performed according to the departmental dose-optimization program which includes automated exposure control, adjustment of the mA and/or kV according to patient size and/or use of iterative reconstruction technique. COMPARISON:  None Available. FINDINGS: CT HEAD FINDINGS Brain: No evidence of acute infarction, hemorrhage, hydrocephalus, extra-axial collection or mass lesion/mass effect. Mild generalized cerebral atrophy and chronic microvascular ischemic changes of the white matter. Vascular: No hyperdense vessel or unexpected calcification. Skull: Normal. Negative for fracture or focal lesion. Sinuses/Orbits: No acute finding. Other: None. CT CERVICAL SPINE FINDINGS Alignment: Mild retrolisthesis at C4. Mild anterolisthesis at C7. Straightening of the cervical spine. Skull base and vertebrae: No acute fracture. No primary bone lesion or focal pathologic process. Soft tissues and spinal canal: No prevertebral fluid or swelling. No visible canal hematoma. Disc levels: Multilevel degenerate disc disease with disc height loss and small marginal osteophytes. C2-C3: Uncovertebral joint arthropathy with mild left neural foraminal stenosis. C3-C4: Disc height loss and uncovertebral joint arthropathy with mild left and moderate right neural foraminal stenosis. C4-C5: Disc height loss and uncovertebral joint arthropathy with moderate bilateral neural foraminal stenosis. C5-C6: Disc height loss and uncovertebral joint arthropathy with moderate left and severe right neural foraminal stenosis. C6-C7: Disc height loss with uncovertebral joint arthropathy and mild right and moderate left  neural foraminal stenosis. C7-T1: Bilateral facet joint arthropathy. No significant spinal canal or neural foraminal stenosis. Upper chest: Biapical pleural/parenchymal scarring. Other: None IMPRESSION: CT HEAD: 1. No acute intracranial abnormality. 2. Mild generalized cerebral atrophy and chronic microvascular ischemic changes of the white matter. CT CERVICAL SPINE: 1. No acute fracture or traumatic subluxation. 2. Multilevel degenerate disc disease with  disc height loss and uncovertebral joint arthropathy. Electronically Signed   By: Keane Police D.O.   On: 08/17/2022 12:07   CT Cervical Spine Wo Contrast  Result Date: 08/17/2022 CLINICAL DATA:  Head and neck trauma. EXAM: CT HEAD WITHOUT CONTRAST CT CERVICAL SPINE WITHOUT CONTRAST TECHNIQUE: Multidetector CT imaging of the head and cervical spine was performed following the standard protocol without intravenous contrast. Multiplanar CT image reconstructions of the cervical spine were also generated. RADIATION DOSE REDUCTION: This exam was performed according to the departmental dose-optimization program which includes automated exposure control, adjustment of the mA and/or kV according to patient size and/or use of iterative reconstruction technique. COMPARISON:  None Available. FINDINGS: CT HEAD FINDINGS Brain: No evidence of acute infarction, hemorrhage, hydrocephalus, extra-axial collection or mass lesion/mass effect. Mild generalized cerebral atrophy and chronic microvascular ischemic changes of the white matter. Vascular: No hyperdense vessel or unexpected calcification. Skull: Normal. Negative for fracture or focal lesion. Sinuses/Orbits: No acute finding. Other: None. CT CERVICAL SPINE FINDINGS Alignment: Mild retrolisthesis at C4. Mild anterolisthesis at C7. Straightening of the cervical spine. Skull base and vertebrae: No acute fracture. No primary bone lesion or focal pathologic process. Soft tissues and spinal canal: No prevertebral fluid or  swelling. No visible canal hematoma. Disc levels: Multilevel degenerate disc disease with disc height loss and small marginal osteophytes. C2-C3: Uncovertebral joint arthropathy with mild left neural foraminal stenosis. C3-C4: Disc height loss and uncovertebral joint arthropathy with mild left and moderate right neural foraminal stenosis. C4-C5: Disc height loss and uncovertebral joint arthropathy with moderate bilateral neural foraminal stenosis. C5-C6: Disc height loss and uncovertebral joint arthropathy with moderate left and severe right neural foraminal stenosis. C6-C7: Disc height loss with uncovertebral joint arthropathy and mild right and moderate left neural foraminal stenosis. C7-T1: Bilateral facet joint arthropathy. No significant spinal canal or neural foraminal stenosis. Upper chest: Biapical pleural/parenchymal scarring. Other: None IMPRESSION: CT HEAD: 1. No acute intracranial abnormality. 2. Mild generalized cerebral atrophy and chronic microvascular ischemic changes of the white matter. CT CERVICAL SPINE: 1. No acute fracture or traumatic subluxation. 2. Multilevel degenerate disc disease with disc height loss and uncovertebral joint arthropathy. Electronically Signed   By: Keane Police D.O.   On: 08/17/2022 12:07   DG Chest 1 View  Result Date: 08/17/2022 CLINICAL DATA:  [place]alaise EXAM: CHEST  1 VIEW COMPARISON:  Chest x-ray dated December 12th 2022 FINDINGS: Cardiac and mediastinal contours are unchanged post median sternotomy and CABG. Left chest wall biventricular pacer with leads unchanged in position. New nodular opacity of the lower right lung, likely due to overlapping osseous and vascular shadows. Lungs are otherwise clear. Pleural effusion or pneumothorax. IMPRESSION: New nodular opacity of the lower right lung, likely due to overlapping osseous and vascular shadows, although pulmonary nodule can not be excluded. Recommend PA and lateral chest x-ray for further evaluation, and if finding  persists CT is recommended for further evaluation. Lungs are otherwise clear. Electronically Signed   By: Yetta Glassman M.D.   On: 08/17/2022 11:45    Microbiology: Recent Results (from the past 240 hour(s))  Resp panel by RT-PCR (RSV, Flu A&B, Covid) Anterior Nasal Swab     Status: None   Collection Time: 08/18/2022 12:26 PM   Specimen: Anterior Nasal Swab  Result Value Ref Range Status   SARS Coronavirus 2 by RT PCR NEGATIVE NEGATIVE Final    Comment: (NOTE) SARS-CoV-2 target nucleic acids are NOT DETECTED.  The SARS-CoV-2 RNA is generally detectable in upper  respiratory specimens during the acute phase of infection. The lowest concentration of SARS-CoV-2 viral copies this assay can detect is 138 copies/mL. A negative result does not preclude SARS-Cov-2 infection and should not be used as the sole basis for treatment or other patient management decisions. A negative result may occur with  improper specimen collection/handling, submission of specimen other than nasopharyngeal swab, presence of viral mutation(s) within the areas targeted by this assay, and inadequate number of viral copies(<138 copies/mL). A negative result must be combined with clinical observations, patient history, and epidemiological information. The expected result is Negative.  Fact Sheet for Patients:  EntrepreneurPulse.com.au  Fact Sheet for Healthcare Providers:  IncredibleEmployment.be  This test is no t yet approved or cleared by the [place] FDA and  has been authorized for detection and/or diagnosis of SARS-CoV-2 by FDA under an Emergency Use Authorization (EUA). This EUA will remain  in effect (meaning this test can be used) for the duration of the COVID-19 declaration under Section 564(b)(1) of the Act, 21 U.S.C.section 360bbb-3(b)(1), unless the authorization is terminated  or revoked sooner.       Influenza A by PCR NEGATIVE NEGATIVE Final   Influenza  B by PCR NEGATIVE NEGATIVE Final    Comment: (NOTE) The Xpert Xpress SARS-CoV-2/FLU/RSV plus assay is intended as an aid in the diagnosis of influenza from Nasopharyngeal swab specimens and should not be used as a sole basis for treatment. Nasal washings and aspirates are unacceptable for Xpert Xpress SARS-CoV-2/FLU/RSV testing.  Fact Sheet for Patients: EntrepreneurPulse.com.au  Fact Sheet for Healthcare Providers: IncredibleEmployment.be  This test is not yet approved or cleared by the [place] FDA and has been authorized for detection and/or diagnosis of SARS-CoV-2 by FDA under an Emergency Use Authorization (EUA). This EUA will remain in effect (meaning this test can be used) for the duration of the COVID-19 declaration under Section 564(b)(1) of the Act, 21 U.S.C. section 360bbb-3(b)(1), unless the authorization is terminated or revoked.     Resp Syncytial Virus by PCR NEGATIVE NEGATIVE Final    Comment: (NOTE) Fact Sheet for Patients: EntrepreneurPulse.com.au  Fact Sheet for Healthcare Providers: IncredibleEmployment.be  This test is not yet approved or cleared by the [place] FDA and has been authorized for detection and/or diagnosis of SARS-CoV-2 by FDA under an Emergency Use Authorization (EUA). This EUA will remain in effect (meaning this test can be used) for the duration of the COVID-19 declaration under Section 564(b)(1) of the Act, 21 U.S.C. section 360bbb-3(b)(1), unless the authorization is terminated or revoked.  Performed at [facility], [place] [address] [place], [place] [address]     Time spent: <30 minutes  Signed: Karmen Bongo, MD Aug 29, 2022

## 2022-09-10 NOTE — Progress Notes (Signed)
[facility]) Hospital Liaison Note  Received request from [place] C. Tarpley-Carter, TOC for IPU. Patient may not be stable for discharge. Will assess in the morning.   Please do not hesitate to call with any questions.    Thank you, Zigmund Gottron RN  [facility] Liaison [phone]

## 2022-09-10 DEATH — deceased

## 2022-09-30 ENCOUNTER — Ambulatory Visit: Payer: Medicare Other | Admitting: Neurology
